# Patient Record
Sex: Female | Born: 1965 | Race: White | Hispanic: No | Marital: Married | Smoking: Never smoker
Health system: Southern US, Community
[De-identification: ages and names within clinical notes are randomized; demographics above are authoritative.]

## PROBLEM LIST (undated history)

## (undated) DIAGNOSIS — Z800 Family history of malignant neoplasm of digestive organs: Secondary | ICD-10-CM

## (undated) DIAGNOSIS — C801 Malignant (primary) neoplasm, unspecified: Secondary | ICD-10-CM

## (undated) DIAGNOSIS — I10 Essential (primary) hypertension: Secondary | ICD-10-CM

## (undated) DIAGNOSIS — E785 Hyperlipidemia, unspecified: Secondary | ICD-10-CM

## (undated) DIAGNOSIS — Z808 Family history of malignant neoplasm of other organs or systems: Secondary | ICD-10-CM

## (undated) DIAGNOSIS — C439 Malignant melanoma of skin, unspecified: Secondary | ICD-10-CM

## (undated) DIAGNOSIS — Z8041 Family history of malignant neoplasm of ovary: Secondary | ICD-10-CM

## (undated) HISTORY — DX: Family history of malignant neoplasm of other organs or systems: Z80.8

## (undated) HISTORY — DX: Hyperlipidemia, unspecified: E78.5

## (undated) HISTORY — DX: Family history of malignant neoplasm of ovary: Z80.41

## (undated) HISTORY — DX: Family history of malignant neoplasm of digestive organs: Z80.0

## (undated) HISTORY — DX: Malignant melanoma of skin, unspecified: C43.9

## (undated) HISTORY — PX: HYSTEROSCOPY: SHX211

## (undated) HISTORY — DX: Essential (primary) hypertension: I10

## (undated) HISTORY — PX: TOTAL ABDOMINAL HYSTERECTOMY: SHX209

## (undated) HISTORY — PX: ABDOMINAL HYSTERECTOMY: SHX81

---

## 2000-01-04 ENCOUNTER — Encounter: Payer: Self-pay | Admitting: Gynecology

## 2000-01-04 ENCOUNTER — Encounter: Admission: RE | Admit: 2000-01-04 | Discharge: 2000-01-04 | Payer: Self-pay | Admitting: Gynecology

## 2000-07-01 ENCOUNTER — Encounter (INDEPENDENT_AMBULATORY_CARE_PROVIDER_SITE_OTHER): Payer: Self-pay | Admitting: Specialist

## 2000-07-01 ENCOUNTER — Ambulatory Visit (HOSPITAL_COMMUNITY): Admission: RE | Admit: 2000-07-01 | Discharge: 2000-07-01 | Payer: Self-pay | Admitting: Gynecology

## 2000-12-26 ENCOUNTER — Encounter (INDEPENDENT_AMBULATORY_CARE_PROVIDER_SITE_OTHER): Payer: Self-pay | Admitting: Specialist

## 2000-12-26 ENCOUNTER — Ambulatory Visit (HOSPITAL_COMMUNITY): Admission: RE | Admit: 2000-12-26 | Discharge: 2000-12-26 | Payer: Self-pay | Admitting: Gynecology

## 2001-08-09 ENCOUNTER — Other Ambulatory Visit: Admission: RE | Admit: 2001-08-09 | Discharge: 2001-08-09 | Payer: Self-pay | Admitting: Gynecology

## 2002-09-11 ENCOUNTER — Other Ambulatory Visit: Admission: RE | Admit: 2002-09-11 | Discharge: 2002-09-11 | Payer: Self-pay | Admitting: Gynecology

## 2003-09-18 ENCOUNTER — Other Ambulatory Visit: Admission: RE | Admit: 2003-09-18 | Discharge: 2003-09-18 | Payer: Self-pay | Admitting: Gynecology

## 2003-09-24 ENCOUNTER — Observation Stay (HOSPITAL_COMMUNITY): Admission: RE | Admit: 2003-09-24 | Discharge: 2003-09-25 | Payer: Self-pay | Admitting: Gynecology

## 2003-09-24 ENCOUNTER — Encounter (INDEPENDENT_AMBULATORY_CARE_PROVIDER_SITE_OTHER): Payer: Self-pay

## 2004-10-30 ENCOUNTER — Other Ambulatory Visit: Admission: RE | Admit: 2004-10-30 | Discharge: 2004-10-30 | Payer: Self-pay | Admitting: Gynecology

## 2004-12-15 ENCOUNTER — Encounter: Admission: RE | Admit: 2004-12-15 | Discharge: 2004-12-15 | Payer: Self-pay | Admitting: Gynecology

## 2006-01-03 ENCOUNTER — Other Ambulatory Visit: Admission: RE | Admit: 2006-01-03 | Discharge: 2006-01-03 | Payer: Self-pay | Admitting: Gynecology

## 2006-01-03 ENCOUNTER — Encounter: Payer: Self-pay | Admitting: Internal Medicine

## 2006-02-07 ENCOUNTER — Encounter: Admission: RE | Admit: 2006-02-07 | Discharge: 2006-02-07 | Payer: Self-pay | Admitting: Gynecology

## 2007-04-13 ENCOUNTER — Encounter: Admission: RE | Admit: 2007-04-13 | Discharge: 2007-04-13 | Payer: Self-pay | Admitting: Gynecology

## 2007-04-17 ENCOUNTER — Ambulatory Visit: Payer: Self-pay | Admitting: Internal Medicine

## 2007-09-11 ENCOUNTER — Other Ambulatory Visit: Admission: RE | Admit: 2007-09-11 | Discharge: 2007-09-11 | Payer: Self-pay | Admitting: Gynecology

## 2007-10-09 ENCOUNTER — Ambulatory Visit: Payer: Self-pay | Admitting: Internal Medicine

## 2007-10-09 DIAGNOSIS — R5081 Fever presenting with conditions classified elsewhere: Secondary | ICD-10-CM | POA: Insufficient documentation

## 2007-10-09 DIAGNOSIS — E785 Hyperlipidemia, unspecified: Secondary | ICD-10-CM | POA: Insufficient documentation

## 2007-10-09 HISTORY — DX: Hyperlipidemia, unspecified: E78.5

## 2007-10-09 LAB — CONVERTED CEMR LAB
Bilirubin Urine: NEGATIVE
Glucose, Urine, Semiquant: NEGATIVE
Protein, U semiquant: 100
Urobilinogen, UA: NEGATIVE
pH: 6.5

## 2007-10-11 ENCOUNTER — Telehealth: Payer: Self-pay | Admitting: Internal Medicine

## 2007-10-19 ENCOUNTER — Telehealth: Payer: Self-pay | Admitting: Internal Medicine

## 2008-02-27 ENCOUNTER — Encounter: Payer: Self-pay | Admitting: Internal Medicine

## 2008-05-09 ENCOUNTER — Ambulatory Visit: Payer: Self-pay | Admitting: Internal Medicine

## 2008-05-09 DIAGNOSIS — N390 Urinary tract infection, site not specified: Secondary | ICD-10-CM | POA: Insufficient documentation

## 2008-08-29 ENCOUNTER — Encounter: Admission: RE | Admit: 2008-08-29 | Discharge: 2008-08-29 | Payer: Self-pay | Admitting: Gynecology

## 2009-01-09 ENCOUNTER — Ambulatory Visit: Payer: Self-pay | Admitting: Internal Medicine

## 2009-01-09 DIAGNOSIS — I10 Essential (primary) hypertension: Secondary | ICD-10-CM

## 2009-01-09 DIAGNOSIS — J069 Acute upper respiratory infection, unspecified: Secondary | ICD-10-CM | POA: Insufficient documentation

## 2009-01-09 HISTORY — DX: Essential (primary) hypertension: I10

## 2009-01-09 LAB — CONVERTED CEMR LAB
Cholesterol: 176 mg/dL (ref 0–200)
Total CHOL/HDL Ratio: 5.8
Triglycerides: 164 mg/dL — ABNORMAL HIGH (ref 0–149)
VLDL: 33 mg/dL (ref 0–40)

## 2009-01-23 ENCOUNTER — Ambulatory Visit: Payer: Self-pay | Admitting: Internal Medicine

## 2009-02-21 ENCOUNTER — Ambulatory Visit: Payer: Self-pay | Admitting: Internal Medicine

## 2010-10-26 ENCOUNTER — Ambulatory Visit: Payer: Self-pay | Admitting: Internal Medicine

## 2010-10-30 ENCOUNTER — Encounter: Admission: RE | Admit: 2010-10-30 | Discharge: 2010-10-30 | Payer: Self-pay | Admitting: Gynecology

## 2010-12-28 ENCOUNTER — Telehealth: Payer: Self-pay | Admitting: Internal Medicine

## 2011-01-19 NOTE — Assessment & Plan Note (Signed)
Summary: MED CK / REFILL // RS   Vital Signs:  Patient profile:   45 year old female Weight:      178 pounds Temp:     98.2 degrees F oral BP sitting:   122 / 74  (right arm) Cuff size:   regular  Vitals Entered By: Duard Brady LPN (October 26, 2010 7:59 AM) CC: medication review and refill      declines flu vaccine Is Patient Diabetic? No   CC:  medication review and refill      declines flu vaccine.  History of Present Illness: 45 year old patient who is seen today for follow-up of her hypertension.  She has maintained nice blood pressure control.  She has a history of mild dyslipidemia.  She is scheduled for laboratory studies at work next month and has had a recent gynecologic examination.  She has a history of mild exogenous obesity.  No concerns or complaints today.  No regular exercise, or recent weight loss remains on hormone replacement therapy  Preventive Screening-Counseling & Management  Alcohol-Tobacco     Smoking Status: never  Allergies (verified): No Known Drug Allergies  Past History:  Past Medical History: Reviewed history from 01/09/2009 and no changes required. Hyperlipidemia Hypertension  Past Surgical History: Reviewed history from 10/09/2007 and no changes required. Hysterectomy 2004  Social History: Smoking Status:  never  Review of Systems  The patient denies anorexia, fever, weight loss, weight gain, vision loss, decreased hearing, hoarseness, chest pain, syncope, dyspnea on exertion, peripheral edema, prolonged cough, headaches, hemoptysis, abdominal pain, melena, hematochezia, severe indigestion/heartburn, hematuria, incontinence, genital sores, muscle weakness, suspicious skin lesions, transient blindness, difficulty walking, depression, unusual weight change, abnormal bleeding, enlarged lymph nodes, angioedema, and breast masses.    Physical Exam  General:  overweight-appearing. 120/74 Head:  Normocephalic and atraumatic without  obvious abnormalities. No apparent alopecia or balding. Eyes:  No corneal or conjunctival inflammation noted. EOMI. Perrla. Funduscopic exam benign, without hemorrhages, exudates or papilledema. Vision grossly normal. Mouth:  Oral mucosa and oropharynx without lesions or exudates.  Teeth in good repair. Neck:  No deformities, masses, or tenderness noted. Lungs:  Normal respiratory effort, chest expands symmetrically. Lungs are clear to auscultation, no crackles or wheezes. Heart:  Normal rate and regular rhythm. S1 and S2 normal without gallop, murmur, click, rub or other extra sounds. Abdomen:  Bowel sounds positive,abdomen soft and non-tender without masses, organomegaly or hernias noted. Msk:  No deformity or scoliosis noted of thoracic or lumbar spine.   Pulses:  R and L carotid,radial,femoral,dorsalis pedis and posterior tibial pulses are full and equal bilaterally Extremities:  No clubbing, cyanosis, edema, or deformity noted with normal full range of motion of all joints.   Skin:  Intact without suspicious lesions or rashes Cervical Nodes:  No lymphadenopathy noted   Impression & Recommendations:  Problem # 1:  HYPERTENSION (ICD-401.9)  Her updated medication list for this problem includes:    Amlodipine Besy-benazepril Hcl 5-10 Mg Caps (Amlodipine besy-benazepril hcl) ..... One daily  Problem # 2:  HYPERLIPIDEMIA (ICD-272.4)  Complete Medication List: 1)  Amlodipine Besy-benazepril Hcl 5-10 Mg Caps (Amlodipine besy-benazepril hcl) .... One daily 2)  Vivelle-dot 0.075 Mg/24hr Pttw (Estradiol) .... Change twice wkly  Patient Instructions: 1)  Please schedule a follow-up appointment in 1 year. 2)  Limit your Sodium (Salt). 3)  It is important that you exercise regularly at least 20 minutes 5 times a week. If you develop chest pain, have severe difficulty breathing, or feel  very tired , stop exercising immediately and seek medical attention. 4)  You need to lose weight. Consider a  lower calorie diet and regular exercise.  5)  Take calcium +Vitamin D daily. 6)  Check your Blood Pressure regularly. If it is above: 150/90  you should make an appointment. Prescriptions: AMLODIPINE BESY-BENAZEPRIL HCL 5-10 MG CAPS (AMLODIPINE BESY-BENAZEPRIL HCL) one daily  #90 x 6   Entered and Authorized by:   Gordy Savers  MD   Signed by:   Gordy Savers  MD on 10/26/2010   Method used:   Electronically to        [facility] (retail)       [address]       [place], [place]  [address]       Ph: [phone]       Fax: [phone]   RxID:   [id number]    Orders Added: 1)  Est. Patient Level III [84132]  Appended Document: MED CK / REFILL // RS  Flu Vaccine Consent Questions     Do you have a history of severe allergic reactions to this vaccine? no    Any prior history of allergic reactions to egg and/or gelatin? no    Do you have a sensitivity to the preservative Thimersol? no    Do you have a past history of Guillan-Barre Syndrome? no    Do you currently have an acute febrile illness? no    Have you ever had a severe reaction to latex? no    Vaccine information given and explained to patient? yes    Are you currently pregnant? no    Lot Number:[id number]   Exp Date:06/19/2011   Site Given  Left Deltoid IM    Allergies: No Known Drug Allergies   Complete Medication List: 1)  Amlodipine Besy-benazepril Hcl 5-10 Mg Caps (Amlodipine besy-benazepril hcl) .... One daily 2)  Vivelle-dot 0.075 Mg/24hr Pttw (Estradiol) .... Change twice wkly  Other Orders: Admin 1st Vaccine (44010) Flu Vaccine 68yrs + [phone])   Orders Added: 1)  Admin 1st Vaccine [90471] 2)  Flu Vaccine 66yrs + [66440]

## 2011-01-21 NOTE — Progress Notes (Signed)
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 KIK

## 2011-02-25 ENCOUNTER — Encounter: Payer: Self-pay | Admitting: Internal Medicine

## 2011-03-01 ENCOUNTER — Encounter: Payer: Self-pay | Admitting: Internal Medicine

## 2011-03-01 ENCOUNTER — Ambulatory Visit (INDEPENDENT_AMBULATORY_CARE_PROVIDER_SITE_OTHER): Payer: BC Managed Care – PPO | Admitting: Internal Medicine

## 2011-03-01 DIAGNOSIS — E785 Hyperlipidemia, unspecified: Secondary | ICD-10-CM

## 2011-03-01 DIAGNOSIS — I10 Essential (primary) hypertension: Secondary | ICD-10-CM

## 2011-03-01 LAB — LIPID PANEL
Total CHOL/HDL Ratio: 5
Triglycerides: 151 mg/dL — ABNORMAL HIGH (ref 0.0–149.0)

## 2011-03-01 LAB — LDL CHOLESTEROL, DIRECT: Direct LDL: 144.2 mg/dL

## 2011-03-01 NOTE — Patient Instructions (Signed)
It is important that you exercise regularly, at least 20 minutes 3 to 4 times per week.  If you develop chest pain or shortness of breath seek  medical attention.  You need to lose weight.  Consider a lower calorie diet and regular exercise.  Return in 6 months for follow-up  

## 2011-03-01 NOTE — Progress Notes (Signed)
  Subjective:    Patient ID: Dawn Cole, female    DOB: [birth year], 45 y.o.   MRN: [id number]  HPI   45 year old patient who has a history of treated hypertension on dual therapy. This has been well-controlled. She did have a lipid profile and other lab done at work late last year. This revealed mild dyslipidemia which he has had in the past. Since that time she has been here to better lifestyle changes with exercise better eating habits and has lost at least 14 pounds in weight.. She feels quite well    Review of Systems  Constitutional: Negative.   HENT: Negative for hearing loss, congestion, sore throat, rhinorrhea, dental problem, sinus pressure and tinnitus.   Eyes: Negative for pain, discharge and visual disturbance.  Respiratory: Negative for cough and shortness of breath.   Cardiovascular: Negative for chest pain, palpitations and leg swelling.  Gastrointestinal: Negative for nausea, vomiting, abdominal pain, diarrhea, constipation, blood in stool and abdominal distention.  Genitourinary: Negative for dysuria, urgency, frequency, hematuria, flank pain, vaginal bleeding, vaginal discharge, difficulty urinating, vaginal pain and pelvic pain.  Musculoskeletal: Negative for joint swelling, arthralgias and gait problem.  Skin: Negative for rash.  Neurological: Negative for dizziness, syncope, speech difficulty, weakness, numbness and headaches.  Hematological: Negative for adenopathy.  Psychiatric/Behavioral: Negative for behavioral problems, dysphoric mood and agitation. The patient is not nervous/anxious.        Objective:   Physical Exam  Constitutional: She is oriented to person, place, and time. She appears well-developed and well-nourished.  HENT:  Head: Normocephalic.  Right Ear: External ear normal.  Left Ear: External ear normal.  Mouth/Throat: Oropharynx is clear and moist.  Eyes: Conjunctivae and EOM are normal. Pupils are equal, round, and reactive to light.  Neck:  Normal range of motion. Neck supple. No thyromegaly present.  Cardiovascular: Normal rate, regular rhythm, normal heart sounds and intact distal pulses.   Pulmonary/Chest: Effort normal and breath sounds normal.  Abdominal: Soft. Bowel sounds are normal. She exhibits no mass. There is no tenderness.  Musculoskeletal: Normal range of motion.  Lymphadenopathy:    She has no cervical adenopathy.  Neurological: She is alert and oriented to person, place, and time.  Skin: Skin is warm and dry. No rash noted.  Psychiatric: She has a normal mood and affect. Her behavior is normal.          Assessment & Plan:   mild dyslipidemia  Voluntary weight loss  Hypertension stable   We'll continue aggressive lifestyle changes and hopefully she will benefit from additional weight loss diet and exercise. A lipid profile will be reviewed today. Her blood pressure and general status will be rechecked in 6 months

## 2011-03-15 ENCOUNTER — Encounter: Payer: Self-pay | Admitting: Internal Medicine

## 2011-05-07 NOTE — Op Note (Signed)
NAME:  ARDRA, KUZNICKI                          ACCOUNT NO.:  [id number]   MEDICAL RECORD NO.:  [id number]                   PATIENT TYPE:  AMB   LOCATION:  DAY                                  FACILITY:  [facility]   PHYSICIAN:  Gretta Cool, M.D.              DATE OF BIRTH:  1966-02-02   DATE OF PROCEDURE:  09/24/2003  DATE OF DISCHARGE:                                 OPERATIVE REPORT   PREOPERATIVE DIAGNOSES:  1. Abnormal uterine bleeding up to 14 days each cycle.  2. Cyclic pelvic pain.   POSTOPERATIVE DIAGNOSES:  1. Abnormal uterine bleeding up to 14 days each cycle.  2. Cyclic pelvic pain.   PROCEDURE:  Vaginal hysterectomy.   SURGEON:  Gretta Cool, M.D.   ASSISTANT:  Raynald Kemp, M.D.   ANESTHESIA:  General orotracheal.   DESCRIPTION OF PROCEDURE:  Under excellent anesthesia as above with the  patient prepped and draped in Allen stirrups in lithotomy position with her  bladder drained by Foley catheter, a weighted speculum was placed in the  vagina.  The cervix was then progressively infiltrated with Xylocaine with  epinephrine 1%, 1:200,000.  The mucosa was then incised and the mucosa  pushed off the lower uterine segment.  The cul-de-sac was then entered with  Mayo scissors.  The uterosacral, then round, then cardinal ligaments were  progressively clamped, cut, sutured, and tied with 0 Vicryl.  The  vesicovaginal plica was opened and the Deaver placed beneath the bladder.  The adnexal structures were then clamped across once the uterus was inverted  and the uterus removed.  Pedicles were then doubly ligated with 0 Vicryl.  At this point all of the pedicles were dry.  Examination of the ovaries  revealed no evidence of endometriosis or other pathology as far as one could  see.  There were minimal adhesions to previous sterilization by Filshie  clip.  The Filshie clip on the right was removed.  On the left it could not  be identified by palpation or by visual  inspection.  At this point the cuff  was closed by a pursestring suture of peritoneum anterior to lateral  pedicles to posterior.  The uterosacral cardinal ligament complex was then  secured to the vaginal cuff as a uterosacral cardinal colposuspension so as  to protect the vaginal cuff from future descent.  At this point the fascia  was approximated edge to edge with a subcuticular closure of 0 Vicryl.  At  this point the patient returned to the recovery room in excellent condition.                                               Gretta Cool, M.D.    CWL/MEDQ  D:  09/24/2003  T:  09/24/2003  Job:  [id number]   cc:   Gordy Savers, M.D. [facility]

## 2011-05-07 NOTE — Assessment & Plan Note (Signed)
[facility] OFFICE NOTE   Cole, Dawn                       MRN:          [id number]  DATE:04/17/2007                            DOB:          September 12, 1966    A 45 year old female seen today to re-establish with our practice. A  laboratory screen at work revealed a mild hypercholesterolemia with a  total cholesterol of 241, LDL cholesterol of 131; the year before these  numbers were 206 and 106 respectively. She has enjoyed excellent health.  She is followed by Dr. Nicholas Lose and has had a hysterectomy in 2004 or 2005.   Weight is 169.   SOCIAL HISTORY:  She is married, two daughters ages 27 and 59. She works  as a Barrister's clerk.   FAMILY HISTORY:  Father died at age 8 of a brain tumor. Mother age 43.  Is a patient here, Dawn Cole, in good health. Two brothers, one  deceased of pancreatic cancer at 75 and one is recently diagnosed with  prostate cancer. Two sisters positive for hypertension.   PHYSICAL EXAMINATION:  Revealed a mildly overweight white female in no  acute distress. Blood pressure 130/90.  SKIN: Negative.  FUNDI, EAR, NOSE AND THROAT: Clear.  CHEST: Was clear.  CARDIOVASCULAR: Normal heart sounds. No murmurs.  ABDOMEN: Slightly overweight, soft and nontender. No organomegaly. No  bruits.  EXTREMITIES: Negative with full peripheral pulses.   IMPRESSION:  Mild hypercholesterolemia.   DISPOSITION:  Diet, weight loss, exercise all encouraged. Will attempt a  non-pharmacologic regimen. Recheck a lipid profile in 6 months.     Gordy Savers, MD  Electronically Signed    PFK/MedQ  DD: 04/17/2007  DT: 04/17/2007  Job #: [id number]

## 2011-05-07 NOTE — H&P (Signed)
NAME:  Dawn Cole, Dawn Cole                          ACCOUNT NO.:  [id number]   MEDICAL RECORD NO.:  [id number]                   PATIENT TYPE:  AMB   LOCATION:  DAY                                  FACILITY:  [facility]   PHYSICIAN:  Gretta Cool, M.D.              DATE OF BIRTH:  1966-08-21   DATE OF ADMISSION:  DATE OF DISCHARGE:                                HISTORY & PHYSICAL   CHIEF COMPLAINT:  1. Abnormal uterine bleeding.  2. Cyclic pelvic pain.   HISTORY OF PRESENT ILLNESS:  A 45 year old gravida 2, para 2 with history of  endometrial polyps.  She has a history of resection of the polyp, but then  recurrence subsequently a couple of years later.  Her endometrial cavity is  now small enough that she cannot have adequate resection of the cavity.  She  has menses lasting seven to 14 days and has pelvic discomfort and cramping  during her entire menses.  She now has sufficient discomfort that it is  incapacitating.  She is admitted for definitive therapy vaginal  hysterectomy, possible abdominal, possible salpingo-oophorectomy.   PAST MEDICAL HISTORY:  Usual childhood disease without sequelae.  Medical  illnesses:  None.  Accident/injuries:  None.   ALLERGIES:  None known.   CURRENT MEDICATIONS:  None.   FAMILY HISTORY:  Father died age 69 of a brain tumor.  Mom is living and  well at 75.  One brother died of cancer of unknown primary.  Four other  siblings living and well.   HABITS:  Denies ethanol, tobacco, recreational drugs.   REVIEW OF SYSTEMS:  HEENT:  Denies symptoms.  CARDIORESPIRATORY:  Denies  asthma, cough, bronchitis, shortness of breath.  GASTROINTESTINAL/GENITOURINARY:  Denies frequency, urgency, dysuria, change  in bowel habits, food intolerance.   PHYSICAL EXAMINATION:  GENERAL:  Well-developed, well-nourished white  female.  HEENT:  Pupils equal, react to light and accommodate.  Fundi not examined.  Oropharynx clear.  NECK:  Supple without mass or  thyroid enlargement.  Node-bearing areas are  negative.  BREASTS:  Without mass, nodes, nipple discharge.  HEART:  Regular rate and rhythm without murmur or cardiac enlargement.  CHEST:  Clear P to A.  ABDOMEN:  Soft, scaphoid without mass, organomegaly.  PELVIC:  External genitalia:  Normal female.  Vagina clean, rugose.  Cervix  is parous, clean.  Uterus is mid position, normal size, shape, contour.  Adnexa clear.  Rectovaginal confirms.  There is adequate descent, but no  pelvic support issues except thinning of the perineal body.  Rectovaginal  examination confirms.  EXTREMITIES:  Negative.  NEUROLOGIC:  Physiologic.    IMPRESSION:  1. Abnormal uterine bleeding 7-14 days.  2. Cyclic pelvic pain throughout menses.  3. History of hysteroscopy, resection of endometrial polyp.  4. History of tubal sterilization.   PLAN:  Vaginal hysterectomy, possible abdominal, possible salpingo-  oophorectomy.  Gretta Cool, M.D.    CWL/MEDQ  D:  09/23/2003  T:  09/23/2003  Job:  [id number]   cc:   Gordy Savers, M.D. [facility]

## 2011-05-07 NOTE — Op Note (Signed)
[facility]  Patient:    Dawn Cole, Dawn Cole                       MRN: [id number] Proc. Date: 12/26/00 Adm. Date:  [id number] Attending:  Katrina Stack                           Operative Report  PREOPERATIVE DIAGNOSES:  Abnormal uterine bleeding, unresponsive to conservative therapy; endometrial polyps demonstrated by saline infusion sonography with previous incomplete hysteroscopic resection.  POSTOPERATIVE DIAGNOSES:  Abnormal uterine bleeding, unresponsive to conservative therapy, endometrial polyps demonstrated by saline infusion sonography with previous incomplete hysteroscopic resection.  PROCEDURE:  SURGEON:  Gretta Cool, M.D.  ANESTHESIA:  IV sedation and paracervical block.  DESCRIPTION OF PROCEDURE:  Under satisfactory paracervical block anesthesia, with the patient prepped and draped in lithotomy position, her cervix was grasped with a single-tooth tenaculum and uterus sounded.  The cavity sounded quite small.  The cervix was progressively dilated with a series of Pratt dilators to accommodate a 7-mm resectoscope.  Upon introducing the hysteroscope into the cavity, a polyp was noted on the posterior wall.  The polyp was resected.  The cavity was quite small and difficulty was had throughout maintaining adequate flush of fluid in and out of the hysteroscope. Significant fluid deficit was noted and there was concern about possible penetration of the posterior wall.  At this point, the procedure was terminated without further complication.  Culdocentesis was performed and returned the fluid deficit to less than 100 cc.  Patient tolerated the procedure without significant discomfort and was returned to the recovery room without complication. DD:  12/26/00 TD:  12/27/00 Job: [id number]

## 2011-05-07 NOTE — Op Note (Signed)
[facility]  Patient:    Dawn Cole, Dawn Cole                       MRN: [id number] Adm. Date:  [id number] Attending:  Katrina Stack                           Operative Report  PREOPERATIVE DIAGNOSES: 1. Desires sterilization. 2. Endometrial polyp with abnormal uterine bleeding.  POSTOPERATIVE DIAGNOSES: 1. Desires sterilization. 2. Endometrial polyp with abnormal uterine bleeding.  PROCEDURE: 1. Filshie clip sterilization. 2. Hysteroscopy. 3. Resection of endometrial polyp. 4. Partial endometrial ablation.  DESCRIPTION OF PROCEDURE:  Under IV sedation and paracervical block and local infiltration, a subumbilical incision was made by a vertical incision in the umbilicus.  A Veress cannula was introduced and an adequate pneumoperitoneum obtained by nitrous oxide.  The laparoscope trocar was then introduced, and the  pelvic organs visualized.  The fallopian tubes were traced to their fimbriated nd. Both ovaries appeared normal.  There was no evidence of significant endometriosis. There was one fibroid bulging subserosally from the uterine surface anteriorly. No other significant pelvic pathology.  The fallopian tubes were grasped approximately 1.0 cm lateral to the uterine insertion, and clipped by Felshie clips on both sides.  Great care was taken to make certain that the clip was closed transluminally across the entire fallopian tube.  Both were visualized closely after the removal of the flier, for better visualization.  Both were well applied. At this point, the attention was turned to the vaginal portion of the procedure. Gas was allowed to escape, and the incision was closed with deep sutures of #5-0 Dexon.  The skin closure with Steri-Strips.  At this point, the patient was reprepped and draped for the vaginal portion of he procedure.  The cervix was grasped with a single tooth tenaculum, and then progressively dilated with a series  of Pratt dilators to accommodate a 7.0 mm resectoscope.  The cervical canal and the endometrial cavity was then examined nd a polyp, that appeared much like a ________ protruded from the anterior wall of  the uterus into the lower uterine segment.  That polyp was excised totally.  A smaller polyp on the contralateral wall was also excised.  At this point the resection was undertaken.  The endometrial cavity was systematically resected approximately 3.0 mm out into myometrial tissue.  A depression was noted in the top of the fundus at the area that the Hulka tenaculum had apparently been previously applied.  There was fluid deficit 300 cc at this point, and because of concern ver possible penetration and excess fluid, a risk of overhydration, the procedure was terminated.  The resection was definitely not completely a total endometrial resection, and a VaporTrode was not used to completely eliminate any endometrial tissue.  There was no residual evidence of polyp formation or other abnormality, however.  At this point the procedure was terminated without complications, with a 300 cc fluid deficit.  The patient tolerated the procedure without discomfort, and upon awakening remained very comfortable.  At this point the patient was returned to the recovery room n excellent condition. DD:  07/01/00 TD:  07/01/00 Job: [id number]

## 2011-08-13 ENCOUNTER — Ambulatory Visit (INDEPENDENT_AMBULATORY_CARE_PROVIDER_SITE_OTHER): Payer: BC Managed Care – PPO | Admitting: Internal Medicine

## 2011-08-13 ENCOUNTER — Encounter: Payer: Self-pay | Admitting: Internal Medicine

## 2011-08-13 VITALS — BP 130/88 | Temp 97.6°F | Wt 160.0 lb

## 2011-08-13 DIAGNOSIS — M545 Low back pain, unspecified: Secondary | ICD-10-CM

## 2011-08-13 DIAGNOSIS — N390 Urinary tract infection, site not specified: Secondary | ICD-10-CM

## 2011-08-13 DIAGNOSIS — I10 Essential (primary) hypertension: Secondary | ICD-10-CM

## 2011-08-13 LAB — POCT URINALYSIS DIPSTICK
Nitrite, UA: POSITIVE
Protein, UA: NEGATIVE
pH, UA: 7.5

## 2011-08-13 MED ORDER — CIPROFLOXACIN HCL 500 MG PO TABS: 500.0000 mg | ORAL_TABLET | Freq: Two times a day (BID) | ORAL | Status: AC

## 2011-08-13 NOTE — Patient Instructions (Signed)
Limit your sodium (Salt) intake    It is important that you exercise regularly, at least 20 minutes 3 to 4 times per week.  If you develop chest pain or shortness of breath seek  medical attention.  Please check your blood pressure on a regular basis.  If it is consistently greater than 150/90, please make an office appointment.  Return in 6 months for follow-up   

## 2011-08-13 NOTE — Progress Notes (Signed)
  Subjective:    Patient ID: Dawn Cole, female    DOB: 17-Dec-1966, 45 y.o.   MRN: [id number]  HPI 45 year old patient who has a history of treated hypertension. The past week she has had low back pain as well as suprapubic pain. She has had a urinary tract infection in the past with very similar symptoms. Not much in way of dysuria or urinary frequency. She does have hypertension and has not been too compliant with taking her medication otherwise she feels well no fever chills or flank pain.   Review of Systems  Constitutional: Negative.   HENT: Negative for hearing loss, congestion, sore throat, rhinorrhea, dental problem, sinus pressure and tinnitus.   Eyes: Negative for pain, discharge and visual disturbance.  Respiratory: Negative for cough and shortness of breath.   Cardiovascular: Negative for chest pain, palpitations and leg swelling.  Gastrointestinal: Negative for nausea, vomiting, abdominal pain, diarrhea, constipation, blood in stool and abdominal distention.  Genitourinary: Negative for dysuria, urgency, frequency, hematuria, flank pain, vaginal bleeding, vaginal discharge, difficulty urinating, vaginal pain and pelvic pain.  Musculoskeletal: Positive for back pain. Negative for joint swelling, arthralgias and gait problem.  Skin: Negative for rash.  Neurological: Negative for dizziness, syncope, speech difficulty, weakness, numbness and headaches.  Hematological: Negative for adenopathy.  Psychiatric/Behavioral: Negative for behavioral problems, dysphoric mood and agitation. The patient is not nervous/anxious.        Objective:   Physical Exam  Constitutional: She is oriented to person, place, and time. She appears well-developed and well-nourished.  HENT:  Head: Normocephalic.  Right Ear: External ear normal.  Left Ear: External ear normal.  Mouth/Throat: Oropharynx is clear and moist.  Eyes: Conjunctivae and EOM are normal. Pupils are equal, round, and reactive to  light.  Neck: Normal range of motion. Neck supple. No thyromegaly present.  Cardiovascular: Normal rate, regular rhythm, normal heart sounds and intact distal pulses.   Pulmonary/Chest: Effort normal and breath sounds normal.  Abdominal: Soft. Bowel sounds are normal. She exhibits no mass. There is no tenderness.       Mild suprapubic tenderness  Musculoskeletal: Normal range of motion.  Lymphadenopathy:    She has no cervical adenopathy.  Neurological: She is alert and oriented to person, place, and time.  Skin: Skin is warm and dry. No rash noted.  Psychiatric: She has a normal mood and affect. Her behavior is normal.          Assessment & Plan:   Recurrent UTI Hypertension suboptimal control. Compliance issues addressed  We'll treat with Cipro 500 mg twice a day for 7 days

## 2011-09-09 ENCOUNTER — Other Ambulatory Visit: Payer: Self-pay | Admitting: Gynecology

## 2011-12-22 ENCOUNTER — Other Ambulatory Visit: Payer: Self-pay | Admitting: Gynecology

## 2011-12-22 DIAGNOSIS — Z1231 Encounter for screening mammogram for malignant neoplasm of breast: Secondary | ICD-10-CM

## 2012-01-07 ENCOUNTER — Ambulatory Visit: Payer: BC Managed Care – PPO

## 2012-02-02 ENCOUNTER — Encounter: Payer: Self-pay | Admitting: Internal Medicine

## 2012-02-03 ENCOUNTER — Ambulatory Visit
Admission: RE | Admit: 2012-02-03 | Discharge: 2012-02-03 | Disposition: A | Discharge status: Home / Self Care | Payer: BC Managed Care – PPO | Source: Ambulatory Visit | Attending: Gynecology | Admitting: Gynecology

## 2012-02-03 DIAGNOSIS — Z1231 Encounter for screening mammogram for malignant neoplasm of breast: Secondary | ICD-10-CM

## 2012-09-14 ENCOUNTER — Other Ambulatory Visit: Payer: Self-pay | Admitting: Gynecology

## 2013-04-17 ENCOUNTER — Other Ambulatory Visit: Payer: Self-pay

## 2013-04-17 DIAGNOSIS — Z1231 Encounter for screening mammogram for malignant neoplasm of breast: Secondary | ICD-10-CM

## 2013-05-03 ENCOUNTER — Ambulatory Visit
Admission: RE | Admit: 2013-05-03 | Discharge: 2013-05-03 | Disposition: A | Discharge status: Home / Self Care | Payer: BC Managed Care – PPO | Source: Ambulatory Visit

## 2013-05-03 DIAGNOSIS — Z1231 Encounter for screening mammogram for malignant neoplasm of breast: Secondary | ICD-10-CM

## 2013-05-24 ENCOUNTER — Other Ambulatory Visit (INDEPENDENT_AMBULATORY_CARE_PROVIDER_SITE_OTHER): Payer: BC Managed Care – PPO

## 2013-05-24 DIAGNOSIS — Z0000 Encounter for general adult medical examination without abnormal findings: Secondary | ICD-10-CM

## 2013-05-24 LAB — BASIC METABOLIC PANEL
GFR: 93.09 mL/min (ref >60.00)
Glucose, Bld: 90 mg/dL (ref 70–99)
Potassium: 4.5 mEq/L (ref 3.5–5.1)
Sodium: 139 mEq/L (ref 135–145)

## 2013-05-24 LAB — LIPID PANEL
Cholesterol: 186 mg/dL (ref 0–200)
HDL: 34.6 mg/dL — ABNORMAL LOW (ref >39.00)
Triglycerides: 140 mg/dL (ref 0.0–149.0)
VLDL: 28 mg/dL (ref 0.0–40.0)

## 2013-05-24 LAB — CBC WITH DIFFERENTIAL/PLATELET
Basophils Absolute: 0.1 10*3/uL (ref 0.0–0.1)
Eosinophils Relative: 4.6 % (ref 0.0–5.0)
MCV: 88.4 fl (ref 78.0–100.0)
Monocytes Absolute: 0.4 10*3/uL (ref 0.1–1.0)
Neutrophils Relative %: 62.7 % (ref 43.0–77.0)
Platelets: 272 10*3/uL (ref 150.0–400.0)
RDW: 12.9 % (ref 11.5–14.6)
WBC: 8.6 10*3/uL (ref 4.5–10.5)

## 2013-05-24 LAB — HEPATIC FUNCTION PANEL
AST: 17 U/L (ref 0–37)
Albumin: 3.6 g/dL (ref 3.5–5.2)
Alkaline Phosphatase: 70 U/L (ref 39–117)
Bilirubin, Direct: 0 mg/dL (ref 0.0–0.3)
Total Protein: 7.2 g/dL (ref 6.0–8.3)

## 2013-05-24 LAB — POCT URINALYSIS DIPSTICK
Bilirubin, UA: NEGATIVE
Blood, UA: NEGATIVE
Ketones, UA: NEGATIVE
Protein, UA: NEGATIVE
Spec Grav, UA: 1.02
pH, UA: 6.5

## 2013-05-29 ENCOUNTER — Ambulatory Visit (INDEPENDENT_AMBULATORY_CARE_PROVIDER_SITE_OTHER): Payer: BC Managed Care – PPO | Admitting: Internal Medicine

## 2013-05-29 ENCOUNTER — Encounter: Payer: Self-pay | Admitting: Internal Medicine

## 2013-05-29 VITALS — BP 120/80 | HR 78 | Temp 98.0°F | Resp 18 | Ht 64.0 in | Wt 173.0 lb

## 2013-05-29 DIAGNOSIS — Z0000 Encounter for general adult medical examination without abnormal findings: Secondary | ICD-10-CM

## 2013-05-29 DIAGNOSIS — I10 Essential (primary) hypertension: Secondary | ICD-10-CM

## 2013-05-29 DIAGNOSIS — Z809 Family history of malignant neoplasm, unspecified: Secondary | ICD-10-CM

## 2013-05-29 MED ORDER — AMLODIPINE BESY-BENAZEPRIL HCL 5-10 MG PO CAPS: 1.0000 | ORAL_CAPSULE | Freq: Every day | ORAL | Status: DC

## 2013-05-29 NOTE — Progress Notes (Signed)
  Subjective:    Patient ID: Dawn Cole, female    DOB: 07-24-1966, 48 y.o.   MRN: [id number]  HPI  47 year old patient who is seen today for a health maintenance examination. She enjoys excellent health. She does have a history of mild dyslipidemia and treated hypertension. No concerns or complaints today.  Family history remarkable for colon cancer. Her mother had colon cancer diagnosed at age 34 and diagnosed with pancreatic cancer at 85. One brother also died of complicates the pancreatic cancer 60 Father died in his 78s of an apparent primary brain neoplasm  Social history married nonsmoker; works at [employer];  17-year-old granddaughter   Review of Systems  Constitutional: Negative.   HENT: Negative for hearing loss, congestion, sore throat, rhinorrhea, dental problem, sinus pressure and tinnitus.   Eyes: Negative for pain, discharge and visual disturbance.  Respiratory: Negative for cough and shortness of breath.   Cardiovascular: Negative for chest pain, palpitations and leg swelling.  Gastrointestinal: Negative for nausea, vomiting, abdominal pain, diarrhea, constipation, blood in stool and abdominal distention.  Genitourinary: Negative for dysuria, urgency, frequency, hematuria, flank pain, vaginal bleeding, vaginal discharge, difficulty urinating, vaginal pain and pelvic pain.  Musculoskeletal: Negative for joint swelling, arthralgias and gait problem.  Skin: Negative for rash.  Neurological: Negative for dizziness, syncope, speech difficulty, weakness, numbness and headaches.  Hematological: Negative for adenopathy.  Psychiatric/Behavioral: Negative for behavioral problems, dysphoric mood and agitation. The patient is not nervous/anxious.        Objective:   Physical Exam  Constitutional: She is oriented to person, place, and time. She appears well-developed and well-nourished.  HENT:  Head: Normocephalic.  Right Ear: External ear normal.  Left Ear:  External ear normal.  Mouth/Throat: Oropharynx is clear and moist.  Eyes: Conjunctivae and EOM are normal. Pupils are equal, round, and reactive to light.  Neck: Normal range of motion. Neck supple. No thyromegaly present.  Cardiovascular: Normal rate, regular rhythm, normal heart sounds and intact distal pulses.   Pulmonary/Chest: Effort normal and breath sounds normal.  Abdominal: Soft. Bowel sounds are normal. She exhibits no mass. There is no tenderness.  Musculoskeletal: Normal range of motion.  Lymphadenopathy:    She has no cervical adenopathy.  Neurological: She is alert and oriented to person, place, and time.  Skin: Skin is warm and dry. No rash noted.  Psychiatric: She has a normal mood and affect. Her behavior is normal.          Assessment & Plan:   Preventive health examination Hypertension well controlled Mild exogenous obesity. Weight loss encouraged  No change in medications Exercise encouraged Home blood pressure monitoring recommended Recheck 1 year

## 2013-05-29 NOTE — Progress Notes (Signed)
  Subjective:    Patient ID: Dawn Cole, female    DOB: 10-Jun-1966, 47 y.o.   MRN: [id number]  HPI  Wt Readings from Last 3 Encounters:  05/29/13 173 lb (78.472 kg)  08/13/11 160 lb (72.576 kg)  03/01/11 164 lb (74.39 kg)    Review of Systems     Objective:   Physical Exam        Assessment & Plan:

## 2013-05-29 NOTE — Patient Instructions (Signed)
Limit your sodium (Salt) intake  You need to lose weight.  Consider a lower calorie diet and regular exercise.  Please check your blood pressure on a regular basis.  If it is consistently greater than 150/90, please make an office appointment.  

## 2013-05-30 ENCOUNTER — Other Ambulatory Visit: Payer: Self-pay | Admitting: *Deleted

## 2013-05-30 MED ORDER — AMLODIPINE BESY-BENAZEPRIL HCL 5-10 MG PO CAPS: 1.0000 | ORAL_CAPSULE | Freq: Every day | ORAL | Status: DC

## 2013-10-25 ENCOUNTER — Other Ambulatory Visit: Payer: Self-pay

## 2014-01-28 ENCOUNTER — Telehealth: Payer: Self-pay | Admitting: Internal Medicine

## 2014-01-28 MED ORDER — AMLODIPINE BESY-BENAZEPRIL HCL 5-10 MG PO CAPS: 1.0000 | ORAL_CAPSULE | Freq: Every day | ORAL | Status: DC

## 2014-01-28 NOTE — Telephone Encounter (Signed)
Pt needs new rx amlodipine-benazepril 5-10 mg #90 w/[facility]ills [facility] [phone]

## 2014-01-28 NOTE — Telephone Encounter (Signed)
Pt notified Rx sent to [facility].

## 2014-05-27 ENCOUNTER — Other Ambulatory Visit (INDEPENDENT_AMBULATORY_CARE_PROVIDER_SITE_OTHER): Payer: BC Managed Care – PPO

## 2014-05-27 DIAGNOSIS — Z0000 Encounter for general adult medical examination without abnormal findings: Secondary | ICD-10-CM

## 2014-05-27 LAB — POCT URINALYSIS DIPSTICK
Bilirubin, UA: NEGATIVE
Blood, UA: NEGATIVE
Glucose, UA: NEGATIVE
KETONES UA: NEGATIVE
LEUKOCYTES UA: NEGATIVE
Nitrite, UA: NEGATIVE
PH UA: 6.5
PROTEIN UA: NEGATIVE
SPEC GRAV UA: 1.015
UROBILINOGEN UA: 0.2

## 2014-05-27 LAB — LIPID PANEL
CHOLESTEROL: 189 mg/dL (ref 0–200)
HDL: 39.5 mg/dL (ref >39.00)
LDL Cholesterol: 117 mg/dL — ABNORMAL HIGH (ref 0–99)
NonHDL: 149.5
Total CHOL/HDL Ratio: 5
Triglycerides: 164 mg/dL — ABNORMAL HIGH (ref 0.0–149.0)
VLDL: 32.8 mg/dL (ref 0.0–40.0)

## 2014-05-27 LAB — BASIC METABOLIC PANEL
BUN: 16 mg/dL (ref 6–23)
CHLORIDE: 106 meq/L (ref 96–112)
CO2: 28 meq/L (ref 19–32)
Calcium: 9.4 mg/dL (ref 8.4–10.5)
Creatinine, Ser: 0.7 mg/dL (ref 0.4–1.2)
GFR: 94.24 mL/min (ref >60.00)
Glucose, Bld: 79 mg/dL (ref 70–99)
POTASSIUM: 4.9 meq/L (ref 3.5–5.1)
SODIUM: 140 meq/L (ref 135–145)

## 2014-05-27 LAB — CBC WITH DIFFERENTIAL/PLATELET
BASOS PCT: 0.3 % (ref 0.0–3.0)
Basophils Absolute: 0 10*3/uL (ref 0.0–0.1)
EOS ABS: 0.4 10*3/uL (ref 0.0–0.7)
EOS PCT: 4.8 % (ref 0.0–5.0)
HEMATOCRIT: 41 % (ref 36.0–46.0)
HEMOGLOBIN: 13.7 g/dL (ref 12.0–15.0)
LYMPHS ABS: 2.2 10*3/uL (ref 0.7–4.0)
Lymphocytes Relative: 28.4 % (ref 12.0–46.0)
MCHC: 33.3 g/dL (ref 30.0–36.0)
MCV: 87.8 fl (ref 78.0–100.0)
MONO ABS: 0.6 10*3/uL (ref 0.1–1.0)
Monocytes Relative: 7.2 % (ref 3.0–12.0)
NEUTROS ABS: 4.6 10*3/uL (ref 1.4–7.7)
NEUTROS PCT: 59.3 % (ref 43.0–77.0)
Platelets: 242 10*3/uL (ref 150.0–400.0)
RBC: 4.67 Mil/uL (ref 3.87–5.11)
RDW: 12.7 % (ref 11.5–15.5)
WBC: 7.8 10*3/uL (ref 4.0–10.5)

## 2014-05-27 LAB — HEPATIC FUNCTION PANEL
ALBUMIN: 3.8 g/dL (ref 3.5–5.2)
ALT: 25 U/L (ref 0–35)
AST: 22 U/L (ref 0–37)
Alkaline Phosphatase: 58 U/L (ref 39–117)
BILIRUBIN DIRECT: 0.1 mg/dL (ref 0.0–0.3)
TOTAL PROTEIN: 6.8 g/dL (ref 6.0–8.3)
Total Bilirubin: 0.8 mg/dL (ref 0.2–1.2)

## 2014-05-27 LAB — TSH: TSH: 1.75 u[IU]/mL (ref 0.35–4.50)

## 2014-05-30 ENCOUNTER — Ambulatory Visit (INDEPENDENT_AMBULATORY_CARE_PROVIDER_SITE_OTHER): Payer: BC Managed Care – PPO | Admitting: Internal Medicine

## 2014-05-30 ENCOUNTER — Encounter: Payer: Self-pay | Admitting: Internal Medicine

## 2014-05-30 VITALS — BP 110/70 | HR 73 | Temp 98.3°F | Resp 20 | Ht 62.75 in | Wt 169.0 lb

## 2014-05-30 DIAGNOSIS — E785 Hyperlipidemia, unspecified: Secondary | ICD-10-CM

## 2014-05-30 DIAGNOSIS — Z0000 Encounter for general adult medical examination without abnormal findings: Secondary | ICD-10-CM

## 2014-05-30 DIAGNOSIS — I10 Essential (primary) hypertension: Secondary | ICD-10-CM

## 2014-05-30 DIAGNOSIS — Z23 Encounter for immunization: Secondary | ICD-10-CM

## 2014-05-30 NOTE — Progress Notes (Signed)
Pre-visit discussion using our clinic review tool. No additional management support is needed unless otherwise documented below in the visit note.  

## 2014-05-30 NOTE — Patient Instructions (Signed)
Limit your sodium (Salt) intake  Please check your blood pressure on a regular basis.  If it is consistently greater than 150/90, please make an office appointment.    It is important that you exercise regularly, at least 20 minutes 3 to 4 times per week.  If you develop chest pain or shortness of breath seek  medical attention.  You need to lose weight.  Consider a lower calorie diet and regular exercise.Health Maintenance, Female A healthy lifestyle and preventative care can promote health and wellness.  Maintain regular health, dental, and eye exams.  Eat a healthy diet. Foods like vegetables, fruits, whole grains, low-fat dairy products, and lean protein foods contain the nutrients you need without too many calories. Decrease your intake of foods high in solid fats, added sugars, and salt. Get information about a proper diet from your caregiver, if necessary.  Regular physical exercise is one of the most important things you can do for your health. Most adults should get at least 150 minutes of moderate-intensity exercise (any activity that increases your heart rate and causes you to sweat) each week. In addition, most adults need muscle-strengthening exercises on 2 or more days a week.   Maintain a healthy weight. The body mass index (BMI) is a screening tool to identify possible weight problems. It provides an estimate of body fat based on height and weight. Your caregiver can help determine your BMI, and can help you achieve or maintain a healthy weight. For adults 20 years and older:  A BMI below 18.5 is considered underweight.  A BMI of 18.5 to 24.9 is normal.  A BMI of 25 to 29.9 is considered overweight.  A BMI of 30 and above is considered obese.  Maintain normal blood lipids and cholesterol by exercising and minimizing your intake of saturated fat. Eat a balanced diet with plenty of fruits and vegetables. Blood tests for lipids and cholesterol should begin at age 52 and be  repeated every 5 years. If your lipid or cholesterol levels are high, you are over 50, or you are a high risk for heart disease, you may need your cholesterol levels checked more frequently.Ongoing high lipid and cholesterol levels should be treated with medicines if diet and exercise are not effective.  If you smoke, find out from your caregiver how to quit. If you do not use tobacco, do not start.  Lung cancer screening is recommended for adults aged 37 80 years who are at high risk for developing lung cancer because of a history of smoking. Yearly low-dose computed tomography (CT) is recommended for people who have at least a 30-pack-year history of smoking and are a current smoker or have quit within the past 15 years. A pack year of smoking is smoking an average of 1 pack of cigarettes a day for 1 year (for example: 1 pack a day for 30 years or 2 packs a day for 15 years). Yearly screening should continue until the smoker has stopped smoking for at least 15 years. Yearly screening should also be stopped for people who develop a health problem that would prevent them from having lung cancer treatment.  If you are pregnant, do not drink alcohol. If you are breastfeeding, be very cautious about drinking alcohol. If you are not pregnant and choose to drink alcohol, do not exceed 1 drink per day. One drink is considered to be 12 ounces (355 mL) of beer, 5 ounces (148 mL) of wine, or 1.5 ounces (44 mL) of  liquor.  Avoid use of street drugs. Do not share needles with anyone. Ask for help if you need support or instructions about stopping the use of drugs.  High blood pressure causes heart disease and increases the risk of stroke. Blood pressure should be checked at least every 1 to 2 years. Ongoing high blood pressure should be treated with medicines, if weight loss and exercise are not effective.  If you are 81 to 50 years old, ask your caregiver if you should take aspirin to prevent strokes.  Diabetes  screening involves taking a blood sample to check your fasting blood sugar level. This should be done once every 3 years, after age 85, if you are within normal weight and without risk factors for diabetes. Testing should be considered at a younger age or be carried out more frequently if you are overweight and have at least 1 risk factor for diabetes.  Breast cancer screening is essential preventative care for women. You should practice "breast self-awareness." This means understanding the normal appearance and feel of your breasts and may include breast self-examination. Any changes detected, no matter how small, should be reported to a caregiver. Women in their 55s and 30s should have a clinical breast exam (CBE) by a caregiver as part of a regular health exam every 1 to 3 years. After age 50, women should have a CBE every year. Starting at age 53, women should consider having a mammogram (breast X-ray) every year. Women who have a family history of breast cancer should talk to their caregiver about genetic screening. Women at a high risk of breast cancer should talk to their caregiver about having an MRI and a mammogram every year.  Breast cancer gene (BRCA)-related cancer risk assessment is recommended for women who have family members with BRCA-related cancers. BRCA-related cancers include breast, ovarian, tubal, and peritoneal cancers. Having family members with these cancers may be associated with an increased risk for harmful changes (mutations) in the breast cancer genes BRCA1 and BRCA2. Results of the assessment will determine the need for genetic counseling and BRCA1 and BRCA2 testing.  The Pap test is a screening test for cervical cancer. Women should have a Pap test starting at age 37. Between ages 4 and 80, Pap tests should be repeated every 2 years. Beginning at age 60, you should have a Pap test every 3 years as long as the past 3 Pap tests have been normal. If you had a hysterectomy for a  problem that was not cancer or a condition that could lead to cancer, then you no longer need Pap tests. If you are between ages 13 and 68, and you have had normal Pap tests going back 10 years, you no longer need Pap tests. If you have had past treatment for cervical cancer or a condition that could lead to cancer, you need Pap tests and screening for cancer for at least 20 years after your treatment. If Pap tests have been discontinued, risk factors (such as a new sexual partner) need to be reassessed to determine if screening should be resumed. Some women have medical problems that increase the chance of getting cervical cancer. In these cases, your caregiver may recommend more frequent screening and Pap tests.  The human papillomavirus (HPV) test is an additional test that may be used for cervical cancer screening. The HPV test looks for the virus that can cause the cell changes on the cervix. The cells collected during the Pap test can be tested for  HPV. The HPV test could be used to screen women aged 11 years and older, and should be used in women of any age who have unclear Pap test results. After the age of 48, women should have HPV testing at the same frequency as a Pap test.  Colorectal cancer can be detected and often prevented. Most routine colorectal cancer screening begins at the age of 44 and continues through age 86. However, your caregiver may recommend screening at an earlier age if you have risk factors for colon cancer. On a yearly basis, your caregiver may provide home test kits to check for hidden blood in the stool. Use of a small camera at the end of a tube, to directly examine the colon (sigmoidoscopy or colonoscopy), can detect the earliest forms of colorectal cancer. Talk to your caregiver about this at age 30, when routine screening begins. Direct examination of the colon should be repeated every 5 to 10 years through age 74, unless early forms of pre-cancerous polyps or small growths  are found.  Hepatitis C blood testing is recommended for all people born from [birth year] through 1967 and any individual with known risks for hepatitis C.  Practice safe sex. Use condoms and avoid high-risk sexual practices to reduce the spread of sexually transmitted infections (STIs). Sexually active women aged 42 and younger should be checked for Chlamydia, which is a common sexually transmitted infection. Older women with new or multiple partners should also be tested for Chlamydia. Testing for other STIs is recommended if you are sexually active and at increased risk.  Osteoporosis is a disease in which the bones lose minerals and strength with aging. This can result in serious bone fractures. The risk of osteoporosis can be identified using a bone density scan. Women ages 70 and over and women at risk for fractures or osteoporosis should discuss screening with their caregivers. Ask your caregiver whether you should be taking a calcium supplement or vitamin D to reduce the rate of osteoporosis.  Menopause can be associated with physical symptoms and risks. Hormone replacement therapy is available to decrease symptoms and risks. You should talk to your caregiver about whether hormone replacement therapy is right for you.  Use sunscreen. Apply sunscreen liberally and repeatedly throughout the day. You should seek shade when your shadow is shorter than you. Protect yourself by wearing long sleeves, pants, a wide-brimmed hat, and sunglasses year round, whenever you are outdoors.  Notify your caregiver of new moles or changes in moles, especially if there is a change in shape or color. Also notify your caregiver if a mole is larger than the size of a pencil eraser.  Stay current with your immunizations. Document Released: 06/21/2011 Document Revised: 04/02/2013 Document Reviewed: 06/21/2011 [facility] Patient Information 2014 [place].

## 2014-05-30 NOTE — Progress Notes (Signed)
  Subjective:    Patient ID: Dawn Cole, female    DOB: 1966/03/28, 48 y.o.   MRN: [id number]  HPI  48 year old patient who is seen today for a health maintenance examination.  There is She enjoys excellent health. She does have a history of mild dyslipidemia and treated hypertension. No concerns or complaints today.  Family history remarkable for colon cancer. Her mother had colon cancer diagnosed at age 50 and diagnosed with pancreatic cancer at 50. One brother also died of complicates the pancreatic cancer 50.  A second brother died of metastatic lung cancer, who was a heavy smoker Father died in his 1s of an apparent primary brain neoplasm  She had a screening colonoscopy 2013.  She is followed by gynecology annually with mammograms  Social history married nonsmoker; works at [employer];  70-year-old granddaughter   Review of Systems  Constitutional: Negative.   HENT: Negative for congestion, dental problem, hearing loss, rhinorrhea, sinus pressure, sore throat and tinnitus.   Eyes: Negative for pain, discharge and visual disturbance.  Respiratory: Negative for cough and shortness of breath.   Cardiovascular: Negative for chest pain, palpitations and leg swelling.  Gastrointestinal: Negative for nausea, vomiting, abdominal pain, diarrhea, constipation, blood in stool and abdominal distention.  Genitourinary: Negative for dysuria, urgency, frequency, hematuria, flank pain, vaginal bleeding, vaginal discharge, difficulty urinating, vaginal pain and pelvic pain.  Musculoskeletal: Negative for arthralgias, gait problem and joint swelling.  Skin: Negative for rash.  Neurological: Negative for dizziness, syncope, speech difficulty, weakness, numbness and headaches.  Hematological: Negative for adenopathy.  Psychiatric/Behavioral: Negative for behavioral problems, dysphoric mood and agitation. The patient is not nervous/anxious.        Objective:   Physical Exam   Constitutional: She is oriented to person, place, and time. She appears well-developed and well-nourished.  HENT:  Head: Normocephalic.  Right Ear: External ear normal.  Left Ear: External ear normal.  Mouth/Throat: Oropharynx is clear and moist.  Eyes: Conjunctivae and EOM are normal. Pupils are equal, round, and reactive to light.  Neck: Normal range of motion. Neck supple. No thyromegaly present.  Cardiovascular: Normal rate, regular rhythm, normal heart sounds and intact distal pulses.   Pulmonary/Chest: Effort normal and breath sounds normal.  Abdominal: Soft. Bowel sounds are normal. She exhibits no mass. There is no tenderness.  Musculoskeletal: Normal range of motion.  Lymphadenopathy:    She has no cervical adenopathy.  Neurological: She is alert and oriented to person, place, and time.  Skin: Skin is warm and dry. No rash noted.  Psychiatric: She has a normal mood and affect. Her behavior is normal.          Assessment & Plan:   Preventive health examination Hypertension well controlled Mild exogenous obesity. Weight loss encouraged  No change in medications Exercise encouraged Home blood pressure monitoring recommended Recheck 1 year

## 2014-05-31 ENCOUNTER — Telehealth: Payer: Self-pay | Admitting: Internal Medicine

## 2014-05-31 NOTE — Telephone Encounter (Signed)
Relevant patient education mailed to patient.  

## 2014-06-26 ENCOUNTER — Other Ambulatory Visit: Payer: Self-pay

## 2014-06-26 DIAGNOSIS — Z1231 Encounter for screening mammogram for malignant neoplasm of breast: Secondary | ICD-10-CM

## 2014-07-09 ENCOUNTER — Ambulatory Visit
Admission: RE | Admit: 2014-07-09 | Discharge: 2014-07-09 | Disposition: A | Discharge status: Home / Self Care | Payer: BC Managed Care – PPO | Source: Ambulatory Visit

## 2014-07-09 DIAGNOSIS — Z1231 Encounter for screening mammogram for malignant neoplasm of breast: Secondary | ICD-10-CM

## 2014-08-05 ENCOUNTER — Telehealth: Payer: Self-pay | Admitting: Internal Medicine

## 2014-08-05 MED ORDER — AMLODIPINE BESY-BENAZEPRIL HCL 5-10 MG PO CAPS: 1.0000 | ORAL_CAPSULE | Freq: Every day | ORAL | Status: DC

## 2014-08-05 NOTE — Telephone Encounter (Signed)
Pt needs refill on amlodipine-benazepril 5-10 mg #90 w.[facility]ills [facility]

## 2014-08-05 NOTE — Telephone Encounter (Signed)
Rx sent 

## 2015-02-17 ENCOUNTER — Telehealth: Payer: Self-pay | Admitting: Internal Medicine

## 2015-02-17 MED ORDER — AMLODIPINE BESY-BENAZEPRIL HCL 5-10 MG PO CAPS: 1.0000 | ORAL_CAPSULE | Freq: Every day | ORAL | Status: DC

## 2015-02-17 NOTE — Telephone Encounter (Signed)
Pt request refill of the following: amLODipine-benazepril (LOTREL) 5-10 MG per capsule  Pt physical has been scheduled   Phamacy: [facility]

## 2015-02-17 NOTE — Telephone Encounter (Signed)
Pt notified Rx was sent to [facility].

## 2015-05-27 ENCOUNTER — Other Ambulatory Visit (INDEPENDENT_AMBULATORY_CARE_PROVIDER_SITE_OTHER): Payer: BLUE CROSS/BLUE SHIELD

## 2015-05-27 DIAGNOSIS — Z0000 Encounter for general adult medical examination without abnormal findings: Secondary | ICD-10-CM | POA: Diagnosis not present

## 2015-05-27 LAB — CBC WITH DIFFERENTIAL/PLATELET
Basophils Absolute: 0 10*3/uL (ref 0.0–0.1)
Basophils Relative: 0.4 % (ref 0.0–3.0)
EOS PCT: 2.5 % (ref 0.0–5.0)
Eosinophils Absolute: 0.2 10*3/uL (ref 0.0–0.7)
HCT: 42.3 % (ref 36.0–46.0)
Hemoglobin: 14.2 g/dL (ref 12.0–15.0)
Lymphocytes Relative: 22 % (ref 12.0–46.0)
Lymphs Abs: 1.9 10*3/uL (ref 0.7–4.0)
MCHC: 33.6 g/dL (ref 30.0–36.0)
MCV: 87.3 fl (ref 78.0–100.0)
Monocytes Absolute: 0.6 10*3/uL (ref 0.1–1.0)
Monocytes Relative: 6.5 % (ref 3.0–12.0)
NEUTROS PCT: 68.6 % (ref 43.0–77.0)
Neutro Abs: 6 10*3/uL (ref 1.4–7.7)
Platelets: 254 10*3/uL (ref 150.0–400.0)
RBC: 4.85 Mil/uL (ref 3.87–5.11)
RDW: 12.7 % (ref 11.5–15.5)
WBC: 8.7 10*3/uL (ref 4.0–10.5)

## 2015-05-27 LAB — POCT URINALYSIS DIPSTICK
Bilirubin, UA: NEGATIVE
GLUCOSE UA: NEGATIVE
Ketones, UA: NEGATIVE
Leukocytes, UA: NEGATIVE
Nitrite, UA: NEGATIVE
Protein, UA: NEGATIVE
RBC UA: NEGATIVE
Spec Grav, UA: 1.02
Urobilinogen, UA: 0.2
pH, UA: 6

## 2015-05-27 LAB — HEPATIC FUNCTION PANEL
ALBUMIN: 4.3 g/dL (ref 3.5–5.2)
ALT: 17 U/L (ref 0–35)
AST: 19 U/L (ref 0–37)
Alkaline Phosphatase: 66 U/L (ref 39–117)
BILIRUBIN TOTAL: 0.6 mg/dL (ref 0.2–1.2)
Bilirubin, Direct: 0.1 mg/dL (ref 0.0–0.3)
TOTAL PROTEIN: 7.3 g/dL (ref 6.0–8.3)

## 2015-05-27 LAB — LIPID PANEL
CHOL/HDL RATIO: 5
Cholesterol: 209 mg/dL — ABNORMAL HIGH (ref 0–200)
HDL: 46.1 mg/dL (ref >39.00)
LDL Cholesterol: 136 mg/dL — ABNORMAL HIGH (ref 0–99)
NONHDL: 162.9
Triglycerides: 135 mg/dL (ref 0.0–149.0)
VLDL: 27 mg/dL (ref 0.0–40.0)

## 2015-05-27 LAB — BASIC METABOLIC PANEL
BUN: 16 mg/dL (ref 6–23)
CALCIUM: 9.5 mg/dL (ref 8.4–10.5)
CO2: 28 meq/L (ref 19–32)
Chloride: 104 mEq/L (ref 96–112)
Creatinine, Ser: 0.75 mg/dL (ref 0.40–1.20)
GFR: 86.67 mL/min (ref >60.00)
GLUCOSE: 95 mg/dL (ref 70–99)
Potassium: 5.1 mEq/L (ref 3.5–5.1)
Sodium: 137 mEq/L (ref 135–145)

## 2015-05-27 LAB — TSH: TSH: 1.98 u[IU]/mL (ref 0.35–4.50)

## 2015-06-02 ENCOUNTER — Ambulatory Visit (INDEPENDENT_AMBULATORY_CARE_PROVIDER_SITE_OTHER): Payer: BLUE CROSS/BLUE SHIELD | Admitting: Internal Medicine

## 2015-06-02 ENCOUNTER — Encounter: Payer: Self-pay | Admitting: Internal Medicine

## 2015-06-02 VITALS — BP 110/70 | HR 80 | Temp 97.9°F | Resp 20 | Ht 63.0 in | Wt 172.0 lb

## 2015-06-02 DIAGNOSIS — Z0000 Encounter for general adult medical examination without abnormal findings: Secondary | ICD-10-CM

## 2015-06-02 DIAGNOSIS — E785 Hyperlipidemia, unspecified: Secondary | ICD-10-CM

## 2015-06-02 DIAGNOSIS — I10 Essential (primary) hypertension: Secondary | ICD-10-CM

## 2015-06-02 NOTE — Progress Notes (Signed)
Pre visit review using our clinic review tool, if applicable. No additional management support is needed unless otherwise documented below in the visit note. 

## 2015-06-02 NOTE — Progress Notes (Signed)
  Subjective:    Patient ID: Dawn Cole, female    DOB: April 05, 1966, 49 y.o.   MRN: [id number]  HPI 49 -year-old patient who is seen today for a health maintenance examination.  There is She enjoys excellent health. She does have a history of mild dyslipidemia and treated hypertension. No concerns or complaints today.  The patient is exercising regularly and attempting to lose weight.  Blood pressure has been low normal.  She wishes to consider a challenge off blood pressure medication  Family history remarkable for colon cancer. Her mother had colon cancer diagnosed at age 45 and diagnosed with pancreatic cancer at 63. One brother also died of complicates the pancreatic cancer 13.  A second brother died of metastatic lung cancer, who was a heavy smoker Father died in his 51s of an apparent primary brain neoplasm  She had a screening colonoscopy 2013.  She is followed by gynecology annually with mammograms  Social history married nonsmoker; works at [employer];  51-year-old granddaughter   Review of Systems  Constitutional: Negative.   HENT: Negative for congestion, dental problem, hearing loss, rhinorrhea, sinus pressure, sore throat and tinnitus.   Eyes: Negative for pain, discharge and visual disturbance.  Respiratory: Negative for cough and shortness of breath.   Cardiovascular: Negative for chest pain, palpitations and leg swelling.  Gastrointestinal: Negative for nausea, vomiting, abdominal pain, diarrhea, constipation, blood in stool and abdominal distention.  Genitourinary: Negative for dysuria, urgency, frequency, hematuria, flank pain, vaginal bleeding, vaginal discharge, difficulty urinating, vaginal pain and pelvic pain.  Musculoskeletal: Negative for joint swelling, arthralgias and gait problem.  Skin: Negative for rash.  Neurological: Negative for dizziness, syncope, speech difficulty, weakness, numbness and headaches.  Hematological: Negative for adenopathy.   Psychiatric/Behavioral: Negative for behavioral problems, dysphoric mood and agitation. The patient is not nervous/anxious.        Objective:   Physical Exam  Constitutional: She is oriented to person, place, and time. She appears well-developed and well-nourished.  HENT:  Head: Normocephalic.  Right Ear: External ear normal.  Left Ear: External ear normal.  Mouth/Throat: Oropharynx is clear and moist.  Eyes: Conjunctivae and EOM are normal. Pupils are equal, round, and reactive to light.  Neck: Normal range of motion. Neck supple. No thyromegaly present.  Cardiovascular: Normal rate, regular rhythm, normal heart sounds and intact distal pulses.   Pulmonary/Chest: Effort normal and breath sounds normal.  Abdominal: Soft. Bowel sounds are normal. She exhibits no mass. There is no tenderness.  Musculoskeletal: Normal range of motion.  Lymphadenopathy:    She has no cervical adenopathy.  Neurological: She is alert and oriented to person, place, and time.  Skin: Skin is warm and dry. No rash noted.  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 Her behavior is normal.          Assessment & Plan:   Preventive health examination Hypertension well controlled.  The patient will decrease her blood pressure medication.  2 every other day regimen for 2 weeks and follow blood pressures carefully.  If blood pressure is greater than 140 over 90.  We'll resume medication Mild exogenous obesity. Weight loss encouraged  No change in medications (trial off medication as noted above) Exercise encouraged Home blood pressure monitoring recommended Recheck 1 year Dermatology referral

## 2015-06-02 NOTE — Patient Instructions (Addendum)
Dermatology follow-up  Limit your sodium (Salt) intake    It is important that you exercise regularly, at least 20 minutes 3 to 4 times per week.  If you develop chest pain or shortness of breath seek  medical attention.  You need to lose weight.  Consider a lower calorie diet and regular exercise.  Please check your blood pressure on a regular basis.  If it is consistently greater than 140/90, please make an office appointment.  (Resume  medication)  Health Maintenance Adopting a healthy lifestyle and getting preventive care can go a long way to promote health and wellness. Talk with your health care provider about what schedule of regular examinations is right for you. This is a good chance for you to check in with your provider about disease prevention and staying healthy. In between checkups, there are plenty of things you can do on your own. Experts have done a lot of research about which lifestyle changes and preventive measures are most likely to keep you healthy. Ask your health care provider for more information. WEIGHT AND DIET  Eat a healthy diet  Be sure to include plenty of vegetables, fruits, low-fat dairy products, and lean protein.  Do not eat a lot of foods high in solid fats, added sugars, or salt.  Get regular exercise. This is one of the most important things you can do for your health.  Most adults should exercise for at least 150 minutes each week. The exercise should increase your heart rate and make you sweat (moderate-intensity exercise).  Most adults should also do strengthening exercises at least twice a week. This is in addition to the moderate-intensity exercise.  Maintain a healthy weight  Body mass index (BMI) is a measurement that can be used to identify possible weight problems. It estimates body fat based on height and weight. Your health care provider can help determine your BMI and help you achieve or maintain a healthy weight.  For females 77 years  of age and older:   A BMI below 18.5 is considered underweight.  A BMI of 18.5 to 24.9 is normal.  A BMI of 25 to 29.9 is considered overweight.  A BMI of 30 and above is considered obese.  Watch levels of cholesterol and blood lipids  You should start having your blood tested for lipids and cholesterol at 51 years of age, then have this test every 5 years.  You may need to have your cholesterol levels checked more often if:  Your lipid or cholesterol levels are high.  You are older than 51 years of age.  You are at high risk for heart disease.  CANCER SCREENING   Lung Cancer  Lung cancer screening is recommended for adults 51-70 years old who are at high risk for lung cancer because of a history of smoking.  A yearly low-dose CT scan of the lungs is recommended for people who:  Currently smoke.  Have quit within the past 15 years.  Have at least a 30-pack-year history of smoking. A pack year is smoking an average of one pack of cigarettes a day for 1 year.  Yearly screening should continue until it has been 15 years since you quit.  Yearly screening should stop if you develop a health problem that would prevent you from having lung cancer treatment.  Breast Cancer  Practice breast self-awareness. This means understanding how your breasts normally appear and feel.  It also means doing regular breast self-exams. Let your health care  provider know about any changes, no matter how small.  If you are in your 20s or 30s, you should have a clinical breast exam (CBE) by a health care provider every 1-3 years as part of a regular health exam.  If you are 51 or older, have a CBE every year. Also consider having a breast X-ray (mammogram) every year.  If you have a family history of breast cancer, talk to your health care provider about genetic screening.  If you are at high risk for breast cancer, talk to your health care provider about having an MRI and a mammogram every  year.  Breast cancer gene (BRCA) assessment is recommended for women who have family members with BRCA-related cancers. BRCA-related cancers include:  Breast.  Ovarian.  Tubal.  Peritoneal cancers.  Results of the assessment will determine the need for genetic counseling and BRCA1 and BRCA2 testing. Cervical Cancer Routine pelvic examinations to screen for cervical cancer are no longer recommended for nonpregnant women who are considered low risk for cancer of the pelvic organs (ovaries, uterus, and vagina) and who do not have symptoms. A pelvic examination may be necessary if you have symptoms including those associated with pelvic infections. Ask your health care provider if a screening pelvic exam is right for you.   The Pap test is the screening test for cervical cancer for women who are considered at risk.  If you had a hysterectomy for a problem that was not cancer or a condition that could lead to cancer, then you no longer need Pap tests.  If you are older than 65 years, and you have had normal Pap tests for the past 10 years, you no longer need to have Pap tests.  If you have had past treatment for cervical cancer or a condition that could lead to cancer, you need Pap tests and screening for cancer for at least 20 years after your treatment.  If you no longer get a Pap test, assess your risk factors if they change (such as having a new sexual partner). This can affect whether you should start being screened again.  Some women have medical problems that increase their chance of getting cervical cancer. If this is the case for you, your health care provider may recommend more frequent screening and Pap tests.  The human papillomavirus (HPV) test is another test that may be used for cervical cancer screening. The HPV test looks for the virus that can cause cell changes in the cervix. The cells collected during the Pap test can be tested for HPV.  The HPV test can be used to screen  women 4 years of age and older. Getting tested for HPV can extend the interval between normal Pap tests from three to five years.  An HPV test also should be used to screen women of any age who have unclear Pap test results.  After 51 years of age, women should have HPV testing as often as Pap tests.  Colorectal Cancer  This type of cancer can be detected and often prevented.  Routine colorectal cancer screening usually begins at 51 years of age and continues through 51 years of age.  Your health care provider may recommend screening at an earlier age if you have risk factors for colon cancer.  Your health care provider may also recommend using home test kits to check for hidden blood in the stool.  A small camera at the end of a tube can be used to examine your colon  directly (sigmoidoscopy or colonoscopy). This is done to check for the earliest forms of colorectal cancer.  Routine screening usually begins at age 2.  Direct examination of the colon should be repeated every 5-10 years through 51 years of age. However, you may need to be screened more often if early forms of precancerous polyps or small growths are found. Skin Cancer  Check your skin from head to toe regularly.  Tell your health care provider about any new moles or changes in moles, especially if there is a change in a mole's shape or color.  Also tell your health care provider if you have a mole that is larger than the size of a pencil eraser.  Always use sunscreen. Apply sunscreen liberally and repeatedly throughout the day.  Protect yourself by wearing long sleeves, pants, a wide-brimmed hat, and sunglasses whenever you are outside. HEART DISEASE, DIABETES, AND HIGH BLOOD PRESSURE   Have your blood pressure checked at least every 1-2 years. High blood pressure causes heart disease and increases the risk of stroke.  If you are between 17 years and 65 years old, ask your health care provider if you should take  aspirin to prevent strokes.  Have regular diabetes screenings. This involves taking a blood sample to check your fasting blood sugar level.  If you are at a normal weight and have a low risk for diabetes, have this test once every three years after 51 years of age.  If you are overweight and have a high risk for diabetes, consider being tested at a younger age or more often. PREVENTING INFECTION  Hepatitis B  If you have a higher risk for hepatitis B, you should be screened for this virus. You are considered at high risk for hepatitis B if:  You were born in a country where hepatitis B is common. Ask your health care provider which countries are considered high risk.  Your parents were born in a high-risk country, and you have not been immunized against hepatitis B (hepatitis B vaccine).  You have HIV or AIDS.  You use needles to inject street drugs.  You live with someone who has hepatitis B.  You have had sex with someone who has hepatitis B.  You get hemodialysis treatment.  You take certain medicines for conditions, including cancer, organ transplantation, and autoimmune conditions. Hepatitis C  Blood testing is recommended for:  Everyone born from [birth year] through 1967.  Anyone with known risk factors for hepatitis C. Sexually transmitted infections (STIs)  You should be screened for sexually transmitted infections (STIs) including gonorrhea and chlamydia if:  You are sexually active and are younger than 51 years of age.  You are older than 51 years of age and your health care provider tells you that you are at risk for this type of infection.  Your sexual activity has changed since you were last screened and you are at an increased risk for chlamydia or gonorrhea. Ask your health care provider if you are at risk.  If you do not have HIV, but are at risk, it may be recommended that you take a prescription medicine daily to prevent HIV infection. This is called  pre-exposure prophylaxis (PrEP). You are considered at risk if:  You are sexually active and do not regularly use condoms or know the HIV status of your partner(s).  You take drugs by injection.  You are sexually active with a partner who has HIV. Talk with your health care provider about whether you are  at high risk of being infected with HIV. If you choose to begin PrEP, you should first be tested for HIV. You should then be tested every 3 months for as long as you are taking PrEP.  PREGNANCY   If you are premenopausal and you may become pregnant, ask your health care provider about preconception counseling.  If you may become pregnant, take 400 to 800 micrograms (mcg) of folic acid every day.  If you want to prevent pregnancy, talk to your health care provider about birth control (contraception). OSTEOPOROSIS AND MENOPAUSE   Osteoporosis is a disease in which the bones lose minerals and strength with aging. This can result in serious bone fractures. Your risk for osteoporosis can be identified using a bone density scan.  If you are 65 years of age or older, or if you are at risk for osteoporosis and fractures, ask your health care provider if you should be screened.  Ask your health care provider whether you should take a calcium or vitamin D supplement to lower your risk for osteoporosis.  Menopause may have certain physical symptoms and risks.  Hormone replacement therapy may reduce some of these symptoms and risks. Talk to your health care provider about whether hormone replacement therapy is right for you.  HOME CARE INSTRUCTIONS   Schedule regular health, dental, and eye exams.  Stay current with your immunizations.   Do not use any tobacco products including cigarettes, chewing tobacco, or electronic cigarettes.  If you are pregnant, do not drink alcohol.  If you are breastfeeding, limit how much and how often you drink alcohol.  Limit alcohol intake to no more than 1  drink per day for nonpregnant women. One drink equals 12 ounces of beer, 5 ounces of wine, or 1 ounces of hard liquor.  Do not use street drugs.  Do not share needles.  Ask your health care provider for help if you need support or information about quitting drugs.  Tell your health care provider if you often feel depressed.  Tell your health care provider if you have ever been abused or do not feel safe at home. Document Released: 06/21/2011 Document Revised: 04/22/2014 Document Reviewed: 11/07/2013 [employer] Patient Information 2015 [employer]. This information is not intended to replace advice given to you by your health care provider. Make sure you discuss any questions you have with your health care provider.  

## 2015-06-17 ENCOUNTER — Other Ambulatory Visit: Payer: Self-pay | Admitting: Internal Medicine

## 2015-06-24 ENCOUNTER — Telehealth: Payer: Self-pay | Admitting: Internal Medicine

## 2015-06-24 MED ORDER — AMLODIPINE BESY-BENAZEPRIL HCL 5-10 MG PO CAPS: ORAL_CAPSULE | ORAL | Status: DC

## 2015-06-24 NOTE — Telephone Encounter (Signed)
Pt request refill of the following: amLODipine-benazepril (LOTREL) 5-10 MG per capsule   Pt said she need a few pills called in to the pharmacy until she received her meds from [facility] .    Phamacy: [facility]

## 2015-06-24 NOTE — Telephone Encounter (Signed)
Spoke to pt, told her Rx sent to local pharmacy for 15 pills. Pt verbalized understanding and stated she needs refill sent to [facility] they never got one. Told pt will send refill to [facility] also. Pt verbalized understanding. Rx sent.

## 2015-10-01 ENCOUNTER — Other Ambulatory Visit: Payer: Self-pay

## 2015-10-01 DIAGNOSIS — Z1231 Encounter for screening mammogram for malignant neoplasm of breast: Secondary | ICD-10-CM

## 2015-10-10 ENCOUNTER — Ambulatory Visit
Admission: RE | Admit: 2015-10-10 | Discharge: 2015-10-10 | Disposition: A | Discharge status: Home / Self Care | Payer: BLUE CROSS/BLUE SHIELD | Source: Ambulatory Visit

## 2015-10-10 DIAGNOSIS — Z1231 Encounter for screening mammogram for malignant neoplasm of breast: Secondary | ICD-10-CM

## 2016-06-23 DIAGNOSIS — D2262 Melanocytic nevi of left upper limb, including shoulder: Secondary | ICD-10-CM | POA: Diagnosis not present

## 2016-06-23 DIAGNOSIS — L218 Other seborrheic dermatitis: Secondary | ICD-10-CM | POA: Diagnosis not present

## 2016-06-23 DIAGNOSIS — D2261 Melanocytic nevi of right upper limb, including shoulder: Secondary | ICD-10-CM | POA: Diagnosis not present

## 2016-06-23 DIAGNOSIS — Z85828 Personal history of other malignant neoplasm of skin: Secondary | ICD-10-CM | POA: Diagnosis not present

## 2016-06-23 DIAGNOSIS — B078 Other viral warts: Secondary | ICD-10-CM | POA: Diagnosis not present

## 2016-08-19 ENCOUNTER — Other Ambulatory Visit: Payer: Self-pay | Admitting: Internal Medicine

## 2016-08-19 ENCOUNTER — Telehealth: Payer: Self-pay | Admitting: Internal Medicine

## 2016-08-19 MED ORDER — AMLODIPINE BESY-BENAZEPRIL HCL 5-10 MG PO CAPS: ORAL_CAPSULE | ORAL | 1 refills | Status: DC

## 2016-08-19 NOTE — Telephone Encounter (Signed)
Rx refill sent to [facility].

## 2016-08-19 NOTE — Telephone Encounter (Signed)
° °  Pt has been scheduled for her physical in Jan 2018    Pt req refill   amLODipine-benazepril (LOTREL) 5-10 MG per capsule     Pharmacy [facility]

## 2016-09-22 ENCOUNTER — Other Ambulatory Visit: Payer: Self-pay | Admitting: Internal Medicine

## 2016-09-22 DIAGNOSIS — Z1231 Encounter for screening mammogram for malignant neoplasm of breast: Secondary | ICD-10-CM

## 2016-10-08 DIAGNOSIS — Z23 Encounter for immunization: Secondary | ICD-10-CM | POA: Diagnosis not present

## 2016-10-11 ENCOUNTER — Ambulatory Visit
Admission: RE | Admit: 2016-10-11 | Discharge: 2016-10-11 | Disposition: A | Discharge status: Home / Self Care | Payer: BLUE CROSS/BLUE SHIELD | Source: Ambulatory Visit | Attending: Internal Medicine | Admitting: Internal Medicine

## 2016-10-11 DIAGNOSIS — Z1231 Encounter for screening mammogram for malignant neoplasm of breast: Secondary | ICD-10-CM

## 2016-10-27 ENCOUNTER — Ambulatory Visit (INDEPENDENT_AMBULATORY_CARE_PROVIDER_SITE_OTHER): Payer: BLUE CROSS/BLUE SHIELD | Admitting: Family Medicine

## 2016-10-27 VITALS — BP 120/80 | HR 89 | Temp 98.2°F | Ht 63.0 in | Wt 171.0 lb

## 2016-10-27 DIAGNOSIS — J329 Chronic sinusitis, unspecified: Secondary | ICD-10-CM

## 2016-10-27 MED ORDER — AMOXICILLIN-POT CLAVULANATE 875-125 MG PO TABS: 1.0000 | ORAL_TABLET | Freq: Two times a day (BID) | ORAL | 0 refills | Status: DC

## 2016-10-27 NOTE — Patient Instructions (Signed)
Stay well hydrated Consider nasal irrigation with saline (Netti pot) Start the antibiotic for any fever or progressive facial pain or headaches.

## 2016-10-27 NOTE — Progress Notes (Signed)
Pre visit review using our clinic review tool, if applicable. No additional management support is needed unless otherwise documented below in the visit note. 

## 2016-10-27 NOTE — Progress Notes (Signed)
Subjective:     Patient ID: Dawn Cole, female   DOB: Aug 05, 1966, 50 y.o.   MRN: [id number]  HPI Acute visit for sinus congestion facial pain and headaches. Onset a few days ago. She has nasal congestion worse on the right side. She's had some right maxillary pressure and discomfort and mild headache. No fever. No chills. Took over-the-counter Mucinex without much improvement. Symptoms are worse at night. Only minimal dry cough. No bloody nasal discharge. No purulent nasal discharge.  Past Medical History:  Diagnosis Date  . HYPERLIPIDEMIA 10/09/2007  . HYPERTENSION 01/09/2009   Past Surgical History:  Procedure Laterality Date  . ABDOMINAL HYSTERECTOMY      reports that she has never smoked. She does not have any smokeless tobacco history on file. She reports that she does not drink alcohol or use drugs. family history is not on file. No Known Allergies   Review of Systems  Constitutional: Positive for fatigue. Negative for chills and fever.  HENT: Positive for congestion, sinus pain and sinus pressure.   Respiratory: Positive for cough.        Objective:   Physical Exam  Constitutional: She appears well-developed and well-nourished.  HENT:  Right Ear: External ear normal.  Left Ear: External ear normal.  Nose: Nose normal.  Mouth/Throat: Oropharynx is clear and moist.  Neck: Neck supple.  Cardiovascular: Normal rate and regular rhythm.   Pulmonary/Chest: Effort normal and breath sounds normal. No respiratory distress. She has no wheezes. She has no rales.  Lymphadenopathy:    She has no cervical adenopathy.       Assessment:     Acute sinus congestion    Plan:     -Do not recommend antibiotics at this time. If she has any fever or progressive sinusitis symptoms consider course of Augmentin. Prescription was printed out and signed and will only get filled if symptoms progress -Continue with over-the-counter plain Mucinex -Avoid decongestants with her history of  hypertension -Consider nasal irrigation with saline and we discussed the importance of using distilled water for that and not tap water  Eulas Post MD [facility]

## 2016-12-20 HISTORY — PX: MELANOMA EXCISION: SHX5266

## 2016-12-23 ENCOUNTER — Other Ambulatory Visit (INDEPENDENT_AMBULATORY_CARE_PROVIDER_SITE_OTHER): Payer: BLUE CROSS/BLUE SHIELD

## 2016-12-23 DIAGNOSIS — Z0000 Encounter for general adult medical examination without abnormal findings: Secondary | ICD-10-CM | POA: Diagnosis not present

## 2016-12-23 LAB — CBC WITH DIFFERENTIAL/PLATELET
Basophils Absolute: 0 10*3/uL (ref 0.0–0.1)
Basophils Relative: 0.3 % (ref 0.0–3.0)
EOS PCT: 3.8 % (ref 0.0–5.0)
Eosinophils Absolute: 0.3 10*3/uL (ref 0.0–0.7)
HCT: 41.6 % (ref 36.0–46.0)
HEMOGLOBIN: 14.2 g/dL (ref 12.0–15.0)
Lymphocytes Relative: 28.3 % (ref 12.0–46.0)
Lymphs Abs: 2.2 10*3/uL (ref 0.7–4.0)
MCHC: 34.2 g/dL (ref 30.0–36.0)
MCV: 87.2 fl (ref 78.0–100.0)
Monocytes Absolute: 0.6 10*3/uL (ref 0.1–1.0)
Monocytes Relative: 7.1 % (ref 3.0–12.0)
Neutro Abs: 4.8 10*3/uL (ref 1.4–7.7)
Neutrophils Relative %: 60.5 % (ref 43.0–77.0)
Platelets: 259 10*3/uL (ref 150.0–400.0)
RBC: 4.77 Mil/uL (ref 3.87–5.11)
RDW: 12.6 % (ref 11.5–15.5)
WBC: 7.9 10*3/uL (ref 4.0–10.5)

## 2016-12-23 LAB — POC URINALSYSI DIPSTICK (AUTOMATED)
Bilirubin, UA: NEGATIVE
Blood, UA: NEGATIVE
Glucose, UA: NEGATIVE
KETONES UA: NEGATIVE
LEUKOCYTES UA: NEGATIVE
NITRITE UA: NEGATIVE
PH UA: 6.5
PROTEIN UA: NEGATIVE
Spec Grav, UA: 1.01
Urobilinogen, UA: 0.2

## 2016-12-23 LAB — BASIC METABOLIC PANEL
BUN: 15 mg/dL (ref 6–23)
CO2: 29 mEq/L (ref 19–32)
Calcium: 9.5 mg/dL (ref 8.4–10.5)
Chloride: 105 mEq/L (ref 96–112)
Creatinine, Ser: 0.68 mg/dL (ref 0.40–1.20)
GFR: 96.45 mL/min (ref >60.00)
GLUCOSE: 97 mg/dL (ref 70–99)
POTASSIUM: 4.4 meq/L (ref 3.5–5.1)
SODIUM: 141 meq/L (ref 135–145)

## 2016-12-23 LAB — LIPID PANEL
CHOLESTEROL: 235 mg/dL — AB (ref 0–200)
HDL: 52.5 mg/dL (ref >39.00)
LDL CALC: 156 mg/dL — AB (ref 0–99)
NonHDL: 182.56
Total CHOL/HDL Ratio: 4
Triglycerides: 135 mg/dL (ref 0.0–149.0)
VLDL: 27 mg/dL (ref 0.0–40.0)

## 2016-12-23 LAB — TSH: TSH: 1.5 u[IU]/mL (ref 0.35–4.50)

## 2016-12-23 LAB — HEPATIC FUNCTION PANEL
ALBUMIN: 4.3 g/dL (ref 3.5–5.2)
ALT: 16 U/L (ref 0–35)
AST: 17 U/L (ref 0–37)
Alkaline Phosphatase: 62 U/L (ref 39–117)
Bilirubin, Direct: 0 mg/dL (ref 0.0–0.3)
Total Bilirubin: 0.5 mg/dL (ref 0.2–1.2)
Total Protein: 7.4 g/dL (ref 6.0–8.3)

## 2016-12-28 ENCOUNTER — Ambulatory Visit (INDEPENDENT_AMBULATORY_CARE_PROVIDER_SITE_OTHER): Payer: BLUE CROSS/BLUE SHIELD | Admitting: Internal Medicine

## 2016-12-28 ENCOUNTER — Encounter: Payer: Self-pay | Admitting: Internal Medicine

## 2016-12-28 VITALS — BP 118/64 | HR 71 | Temp 97.4°F | Ht 63.0 in | Wt 174.0 lb

## 2016-12-28 DIAGNOSIS — Z0000 Encounter for general adult medical examination without abnormal findings: Secondary | ICD-10-CM | POA: Diagnosis not present

## 2016-12-28 NOTE — Progress Notes (Signed)
Pre visit review using our clinic review tool, if applicable. No additional management support is needed unless otherwise documented below in the visit note. 

## 2016-12-28 NOTE — Progress Notes (Signed)
Subjective:    Patient ID: Dawn Cole, female    DOB: 1966-10-22, 51 y.o.   MRN: [id number]  HPI  51 year old patient who is seen today for a preventive health examination. She had a mammogram in October.  She is scheduled for GYN follow-up next month as well as a follow-up colonoscopy.  She has a family history of colon cancer. No concerns or complaints.  She does have a history of essential hypertension which has been controlled.  Past Medical History:  Diagnosis Date  . HYPERLIPIDEMIA 10/09/2007  . HYPERTENSION 01/09/2009     Social History   Social History  . Marital status: Married    Spouse name: N/A  . Number of children: N/A  . Years of education: N/A   Occupational History  . Not on file.   Social History Main Topics  . Smoking status: Never Smoker  . Smokeless tobacco: Not on file  . Alcohol use No  . Drug use: No  . Sexual activity: Not on file   Other Topics Concern  . Not on file   Social History Narrative  . No narrative on file    Past Surgical History:  Procedure Laterality Date  . ABDOMINAL HYSTERECTOMY      No family history on file.  No Known Allergies  Current Outpatient Prescriptions on File Prior to Visit  Medication Sig Dispense Refill  . amLODipine-benazepril (LOTREL) 5-10 MG capsule TAKE 1 BY MOUTH DAILY 90 capsule 1  . calcium carbonate (OS-CAL) 600 MG TABS tablet Take 600 mg by mouth daily.    Marland Kitchen estradiol (VIVELLE-DOT) 0.075 MG/24HR Place 1 patch onto the skin 2 (two) times a week.      . Multiple Vitamin (MULTIVITAMIN) tablet Take 1 tablet by mouth daily.      Marland Kitchen tolterodine (DETROL LA) 2 MG 24 hr capsule Take 2 mg by mouth daily.      No current facility-administered medications on file prior to visit.     BP 118/64 (BP Location: Right Arm, Patient Position: Sitting, Cuff Size: Normal)   Pulse 71   Temp 97.4 F (36.3 C) (Oral)   Ht 5\' 3"  (1.6 m)   Wt 174 lb (78.9 kg)   SpO2 96%   BMI 30.82 kg/m     Review of  Systems  Constitutional: Negative.   HENT: Negative for congestion, dental problem, hearing loss, rhinorrhea, sinus pressure, sore throat and tinnitus.   Eyes: Negative for pain, discharge and visual disturbance.  Respiratory: Negative for cough and shortness of breath.   Cardiovascular: Negative for chest pain, palpitations and leg swelling.  Gastrointestinal: Negative for abdominal distention, abdominal pain, blood in stool, constipation, diarrhea, nausea and vomiting.  Genitourinary: Negative for difficulty urinating, dysuria, flank pain, frequency, hematuria, pelvic pain, urgency, vaginal bleeding, vaginal discharge and vaginal pain.  Musculoskeletal: Negative for arthralgias, gait problem and joint swelling.  Skin: Negative for rash.  Neurological: Negative for dizziness, syncope, speech difficulty, weakness, numbness and headaches.  Hematological: Negative for adenopathy.  Psychiatric/Behavioral: Negative for agitation, behavioral problems and dysphoric mood. The patient is not nervous/anxious.        Objective:   Physical Exam  Constitutional: She is oriented to person, place, and time. She appears well-developed and well-nourished.  HENT:  Head: Normocephalic.  Right Ear: External ear normal.  Left Ear: External ear normal.  Mouth/Throat: Oropharynx is clear and moist.  Eyes: Conjunctivae and EOM are normal. Pupils are equal, round, and reactive to light.  Neck: Normal  range of motion. Neck supple. No thyromegaly present.  Cardiovascular: Normal rate, regular rhythm, normal heart sounds and intact distal pulses.   Pulmonary/Chest: Effort normal and breath sounds normal.  Abdominal: Soft. Bowel sounds are normal. She exhibits no mass. There is no tenderness.  Musculoskeletal: Normal range of motion.  Lymphadenopathy:    She has no cervical adenopathy.  Neurological: She is alert and oriented to person, place, and time.  Skin: Skin is warm and dry. No rash noted.  Psychiatric:  She has a normal mood and affect. Her behavior is normal.          Assessment & Plan:   Preventive health exam.  Follow GYN.  Follow-up colonoscopy Essential hypertension, stable.  No change in medical regimen.  Continue home bod pressure monitoring  Ripley Lovecchio Pilar Plate

## 2016-12-28 NOTE — Patient Instructions (Addendum)
Limit your sodium (Salt) intake    It is important that you exercise regularly, at least 20 minutes 3 to 4 times per week.  If you develop chest pain or shortness of breath seek  medical attention.  Take a calcium supplement, plus [phone] units of vitamin D  Please check your blood pressure on a regular basis.  If it is consistently greater than 150/90, please make an office appointment.    Health Maintenance for Postmenopausal Women Introduction Menopause is a normal process in which your reproductive ability comes to an end. This process happens gradually over a span of months to years, usually between the ages of 70 and 65. Menopause is complete when you have missed 12 consecutive menstrual periods. It is important to talk with your health care provider about some of the most common conditions that affect postmenopausal women, such as heart disease, cancer, and bone loss (osteoporosis). Adopting a healthy lifestyle and getting preventive care can help to promote your health and wellness. Those actions can also lower your chances of developing some of these common conditions. What should I know about menopause? During menopause, you may experience a number of symptoms, such as:  Moderate-to-severe hot flashes.  Night sweats.  Decrease in sex drive.  Mood swings.  Headaches.  Tiredness.  Irritability.  Memory problems.  Insomnia. Choosing to treat or not to treat menopausal changes is an individual decision that you make with your health care provider. What should I know about hormone replacement therapy and supplements? Hormone therapy products are effective for treating symptoms that are associated with menopause, such as hot flashes and night sweats. Hormone replacement carries certain risks, especially as you become older. If you are thinking about using estrogen or estrogen with progestin treatments, discuss the benefits and risks with your health care provider. What  should I know about heart disease and stroke? Heart disease, heart attack, and stroke become more likely as you age. This may be due, in part, to the hormonal changes that your body experiences during menopause. These can affect how your body processes dietary fats, triglycerides, and cholesterol. Heart attack and stroke are both medical emergencies. There are many things that you can do to help prevent heart disease and stroke:  Have your blood pressure checked at least every 1-2 years. High blood pressure causes heart disease and increases the risk of stroke.  If you are 39-46 years old, ask your health care provider if you should take aspirin to prevent a heart attack or a stroke.  Do not use any tobacco products, including cigarettes, chewing tobacco, or electronic cigarettes. If you need help quitting, ask your health care provider.  It is important to eat a healthy diet and maintain a healthy weight.  Be sure to include plenty of vegetables, fruits, low-fat dairy products, and lean protein.  Avoid eating foods that are high in solid fats, added sugars, or salt (sodium).  Get regular exercise. This is one of the most important things that you can do for your health.  Try to exercise for at least 150 minutes each week. The type of exercise that you do should increase your heart rate and make you sweat. This is known as moderate-intensity exercise.  Try to do strengthening exercises at least twice each week. Do these in addition to the moderate-intensity exercise.  Know your numbers.Ask your health care provider to check your cholesterol and your blood glucose. Continue to have your blood tested as directed by your health care  provider. What should I know about cancer screening? There are several types of cancer. Take the following steps to reduce your risk and to catch any cancer development as early as possible. Breast Cancer  Practice breast self-awareness.  This means  understanding how your breasts normally appear and feel.  It also means doing regular breast self-exams. Let your health care provider know about any changes, no matter how small.  If you are 52 or older, have a clinician do a breast exam (clinical breast exam or CBE) every year. Depending on your age, family history, and medical history, it may be recommended that you also have a yearly breast X-ray (mammogram).  If you have a family history of breast cancer, talk with your health care provider about genetic screening.  If you are at high risk for breast cancer, talk with your health care provider about having an MRI and a mammogram every year.  Breast cancer (BRCA) gene test is recommended for women who have family members with BRCA-related cancers. Results of the assessment will determine the need for genetic counseling and BRCA1 and for BRCA2 testing. BRCA-related cancers include these types:  Breast. This occurs in males or females.  Ovarian.  Tubal. This may also be called fallopian tube cancer.  Cancer of the abdominal or pelvic lining (peritoneal cancer).  Prostate.  Pancreatic. Cervical, Uterine, and Ovarian Cancer  Your health care provider may recommend that you be screened regularly for cancer of the pelvic organs. These include your ovaries, uterus, and vagina. This screening involves a pelvic exam, which includes checking for microscopic changes to the surface of your cervix (Pap test).  For women ages 21-65, health care providers may recommend a pelvic exam and a Pap test every three years. For women ages 37-65, they may recommend the Pap test and pelvic exam, combined with testing for human papilloma virus (HPV), every five years. Some types of HPV increase your risk of cervical cancer. Testing for HPV may also be done on women of any age who have unclear Pap test results.  Other health care providers may not recommend any screening for nonpregnant women who are considered  low risk for pelvic cancer and have no symptoms. Ask your health care provider if a screening pelvic exam is right for you.  If you have had past treatment for cervical cancer or a condition that could lead to cancer, you need Pap tests and screening for cancer for at least 20 years after your treatment. If Pap tests have been discontinued for you, your risk factors (such as having a new sexual partner) need to be reassessed to determine if you should start having screenings again. Some women have medical problems that increase the chance of getting cervical cancer. In these cases, your health care provider may recommend that you have screening and Pap tests more often.  If you have a family history of uterine cancer or ovarian cancer, talk with your health care provider about genetic screening.  If you have vaginal bleeding after reaching menopause, tell your health care provider.  There are currently no reliable tests available to screen for ovarian cancer. Lung Cancer  Lung cancer screening is recommended for adults 37-68 years old who are at high risk for lung cancer because of a history of smoking. A yearly low-dose CT scan of the lungs is recommended if you:  Currently smoke.  Have a history of at least 30 pack-years of smoking and you currently smoke or have quit within the  past 15 years. A pack-year is smoking an average of one pack of cigarettes per day for one year. Yearly screening should:  Continue until it has been 15 years since you quit.  Stop if you develop a health problem that would prevent you from having lung cancer treatment. Colorectal Cancer  This type of cancer can be detected and can often be prevented.  Routine colorectal cancer screening usually begins at age 46 and continues through age 70.  If you have risk factors for colon cancer, your health care provider may recommend that you be screened at an earlier age.  If you have a family history of colorectal  cancer, talk with your health care provider about genetic screening.  Your health care provider may also recommend using home test kits to check for hidden blood in your stool.  A small camera at the end of a tube can be used to examine your colon directly (sigmoidoscopy or colonoscopy). This is done to check for the earliest forms of colorectal cancer.  Direct examination of the colon should be repeated every 5-10 years until age 30. However, if early forms of precancerous polyps or small growths are found or if you have a family history or genetic risk for colorectal cancer, you may need to be screened more often. Skin Cancer  Check your skin from head to toe regularly.  Monitor any moles. Be sure to tell your health care provider:  About any new moles or changes in moles, especially if there is a change in a mole's shape or color.  If you have a mole that is larger than the size of a pencil eraser.  If any of your family members has a history of skin cancer, especially at a young age, talk with your health care provider about genetic screening.  Always use sunscreen. Apply sunscreen liberally and repeatedly throughout the day.  Whenever you are outside, protect yourself by wearing long sleeves, pants, a wide-brimmed hat, and sunglasses. What should I know about osteoporosis? Osteoporosis is a condition in which bone destruction happens more quickly than new bone creation. After menopause, you may be at an increased risk for osteoporosis. To help prevent osteoporosis or the bone fractures that can happen because of osteoporosis, the following is recommended:  If you are 97-72 years old, get at least 1,000 mg of calcium and at least 600 mg of vitamin D per day.  If you are older than age 57 but younger than age 28, get at least 1,200 mg of calcium and at least 600 mg of vitamin D per day.  If you are older than age 11, get at least 1,200 mg of calcium and at least 800 mg of vitamin D  per day. Smoking and excessive alcohol intake increase the risk of osteoporosis. Eat foods that are rich in calcium and vitamin D, and do weight-bearing exercises several times each week as directed by your health care provider. What should I know about how menopause affects my mental health? Depression may occur at any age, but it is more common as you become older. Common symptoms of depression include:  Low or sad mood.  Changes in sleep patterns.  Changes in appetite or eating patterns.  Feeling an overall lack of motivation or enjoyment of activities that you previously enjoyed.  Frequent crying spells. Talk with your health care provider if you think that you are experiencing depression. What should I know about immunizations? It is important that you get and maintain  your immunizations. These include:  Tetanus, diphtheria, and pertussis (Tdap) booster vaccine.  Influenza every year before the flu season begins.  Pneumonia vaccine.  Shingles vaccine. Your health care provider may also recommend other immunizations. This information is not intended to replace advice given to you by your health care provider. Make sure you discuss any questions you have with your health care provider. Document Released: 01/28/2006 Document Revised: 06/25/2016 Document Reviewed: 09/09/2015  2017 [employer]vier [employer]

## 2017-02-03 DIAGNOSIS — N951 Menopausal and female climacteric states: Secondary | ICD-10-CM | POA: Diagnosis not present

## 2017-02-03 DIAGNOSIS — Z79890 Hormone replacement therapy (postmenopausal): Secondary | ICD-10-CM | POA: Diagnosis not present

## 2017-02-03 DIAGNOSIS — Z01419 Encounter for gynecological examination (general) (routine) without abnormal findings: Secondary | ICD-10-CM | POA: Diagnosis not present

## 2017-02-03 DIAGNOSIS — Z6831 Body mass index (BMI) 31.0-31.9, adult: Secondary | ICD-10-CM | POA: Diagnosis not present

## 2017-02-17 HISTORY — PX: COLONOSCOPY: SHX174

## 2017-03-07 DIAGNOSIS — Z800 Family history of malignant neoplasm of digestive organs: Secondary | ICD-10-CM | POA: Diagnosis not present

## 2017-03-07 DIAGNOSIS — Z1211 Encounter for screening for malignant neoplasm of colon: Secondary | ICD-10-CM | POA: Diagnosis not present

## 2017-03-07 DIAGNOSIS — Z85038 Personal history of other malignant neoplasm of large intestine: Secondary | ICD-10-CM | POA: Diagnosis not present

## 2017-03-14 DIAGNOSIS — H35033 Hypertensive retinopathy, bilateral: Secondary | ICD-10-CM | POA: Diagnosis not present

## 2017-03-16 DIAGNOSIS — Z6831 Body mass index (BMI) 31.0-31.9, adult: Secondary | ICD-10-CM | POA: Diagnosis not present

## 2017-03-16 DIAGNOSIS — E669 Obesity, unspecified: Secondary | ICD-10-CM | POA: Diagnosis not present

## 2017-03-17 DIAGNOSIS — Z0000 Encounter for general adult medical examination without abnormal findings: Secondary | ICD-10-CM | POA: Diagnosis not present

## 2017-03-22 ENCOUNTER — Telehealth: Payer: Self-pay | Admitting: Internal Medicine

## 2017-03-22 NOTE — Telephone Encounter (Signed)
° ° °  Pt request refill of the following:  amLODipine-benazepril (LOTREL) 5-10 MG capsule   Phamacy:   [facility]  Mail order     Pt said she only has a few left and ask if a 30 day supply can be called in to her local [facility]

## 2017-03-24 MED ORDER — AMLODIPINE BESY-BENAZEPRIL HCL 5-10 MG PO CAPS: ORAL_CAPSULE | ORAL | 2 refills | Status: DC

## 2017-03-24 MED ORDER — AMLODIPINE BESY-BENAZEPRIL HCL 5-10 MG PO CAPS: ORAL_CAPSULE | ORAL | 0 refills | Status: DC

## 2017-03-24 NOTE — Telephone Encounter (Signed)
Medication refills were sent as requested by pt.

## 2017-06-23 DIAGNOSIS — D1801 Hemangioma of skin and subcutaneous tissue: Secondary | ICD-10-CM | POA: Diagnosis not present

## 2017-06-23 DIAGNOSIS — L814 Other melanin hyperpigmentation: Secondary | ICD-10-CM | POA: Diagnosis not present

## 2017-06-23 DIAGNOSIS — C4372 Malignant melanoma of left lower limb, including hip: Secondary | ICD-10-CM | POA: Diagnosis not present

## 2017-06-23 DIAGNOSIS — D485 Neoplasm of uncertain behavior of skin: Secondary | ICD-10-CM | POA: Diagnosis not present

## 2017-06-23 DIAGNOSIS — L821 Other seborrheic keratosis: Secondary | ICD-10-CM | POA: Diagnosis not present

## 2017-06-23 DIAGNOSIS — Z85828 Personal history of other malignant neoplasm of skin: Secondary | ICD-10-CM | POA: Diagnosis not present

## 2017-07-12 ENCOUNTER — Other Ambulatory Visit: Payer: Self-pay | Admitting: General Surgery

## 2017-07-12 DIAGNOSIS — C4372 Malignant melanoma of left lower limb, including hip: Secondary | ICD-10-CM

## 2017-07-21 ENCOUNTER — Other Ambulatory Visit (HOSPITAL_COMMUNITY): Payer: Self-pay

## 2017-07-21 NOTE — Pre-Procedure Instructions (Signed)
Dawn Cole  07/21/2017      [facility] - [place], [place] [address] [place] [address] Phone: [phone] Fax: [phone]  [facility] - [place], [place] [address] Phone: [phone] Fax: [phone] [place], [address] [place] [address] Phone: [phone] Fax: [phone]    Your procedure is scheduled on August 02, 2017  Report to [facility] Admitting Entrance "A" at 6:30 A.M.  Call this number if you have problems the morning of surgery:  [phone]   Remember:  Do not eat food or drink liquids after midnight on August 01, 2017  Take these medicines the morning of surgery with A SIP OF WATER: Tolterodine (DETROL LA).  7 days before surgery stop taking all Aspirins, Vitamins, Fish oils, and Herbal medications. Also stop all NSAIDS i.e. Advil, Motrin, Aleve, Anaprox, Naproxen, BC and Goody Powders.   Do not wear jewelry, make-up or nail polish.  Do not wear lotions, powders, or perfumes, or deodorant.  Do not shave 48 hours prior to surgery.    Do not bring valuables to the hospital.  [facility] is not responsible for any belongings or valuables.  Contacts, dentures or bridgework may not be worn into surgery.  Leave your suitcase in the car.  After surgery it may be brought to your room.  For patients admitted to the hospital, discharge time will be determined by your treatment team.  Patients discharged the day of surgery will not be allowed to drive home.   Special instructions:  [facility]- Preparing For Surgery  Before surgery, you can play an important role. Because skin is not sterile, your skin needs to be as free of germs as possible. You can reduce the number of germs on your skin by washing with CHG (chlorahexidine gluconate)  Soap before surgery.  CHG is an antiseptic cleaner which kills germs and bonds with the skin to continue killing germs even after washing.  Please do not use if you have an allergy to CHG or antibacterial soaps. If your skin becomes reddened/irritated stop using the CHG.  Do not shave (including legs and underarms) for at least 48 hours prior to first CHG shower. It is OK to shave your face.  Please follow these instructions carefully.   1. Shower the NIGHT BEFORE SURGERY and the MORNING OF SURGERY with CHG.   2. If you chose to wash your hair, wash your hair first as usual with your normal shampoo.  3. After you shampoo, rinse your hair and body thoroughly to remove the shampoo.  4. Use CHG as you would any other liquid soap. You can apply CHG directly to the skin and wash gently with a scrungie or a clean washcloth.   5. Apply the CHG Soap to your body ONLY FROM THE NECK DOWN.  Do not use on open wounds or open sores. Avoid contact with your eyes, ears, mouth and genitals (private parts). Wash genitals (private parts) with your normal soap.  6. Wash thoroughly, paying special attention to the area where your surgery will be performed.  7. Thoroughly rinse your body with warm water from the neck down.  8. DO NOT shower/wash with your normal soap after using and rinsing off the CHG Soap.  9. Pat yourself dry with a CLEAN TOWEL.  10. Wear CLEAN PAJAMAS   11. Place CLEAN SHEETS on your bed the night of your first shower and DO NOT SLEEP WITH PETS.  Day of Surgery: Do not apply any deodorants/lotions. Please wear clean clothes to the hospital/surgery center.    Please read over the following fact sheets that you were given. Pain Booklet, Coughing and Deep Breathing and Surgical Site Infection Prevention

## 2017-07-22 ENCOUNTER — Encounter (HOSPITAL_COMMUNITY): Payer: Self-pay

## 2017-07-22 ENCOUNTER — Encounter (HOSPITAL_COMMUNITY)
Admission: RE | Admit: 2017-07-22 | Discharge: 2017-07-22 | Disposition: A | Discharge status: Home / Self Care | Payer: BLUE CROSS/BLUE SHIELD | Source: Ambulatory Visit | Attending: General Surgery | Admitting: General Surgery

## 2017-07-22 DIAGNOSIS — I10 Essential (primary) hypertension: Secondary | ICD-10-CM | POA: Insufficient documentation

## 2017-07-22 DIAGNOSIS — C4372 Malignant melanoma of left lower limb, including hip: Secondary | ICD-10-CM | POA: Insufficient documentation

## 2017-07-22 DIAGNOSIS — Z01812 Encounter for preprocedural laboratory examination: Secondary | ICD-10-CM | POA: Diagnosis not present

## 2017-07-22 HISTORY — DX: Malignant (primary) neoplasm, unspecified: C80.1

## 2017-07-22 LAB — COMPREHENSIVE METABOLIC PANEL
ALBUMIN: 3.8 g/dL (ref 3.5–5.0)
ALK PHOS: 66 U/L (ref 38–126)
ALT: 22 U/L (ref 14–54)
ANION GAP: 6 (ref 5–15)
AST: 23 U/L (ref 15–41)
BUN: 14 mg/dL (ref 6–20)
CALCIUM: 9.3 mg/dL (ref 8.9–10.3)
CO2: 27 mmol/L (ref 22–32)
Chloride: 106 mmol/L (ref 101–111)
Creatinine, Ser: 0.71 mg/dL (ref 0.44–1.00)
GFR calc Af Amer: >60 mL/min (ref >60)
GFR calc non Af Amer: >60 mL/min (ref >60)
GLUCOSE: 101 mg/dL — AB (ref 65–99)
POTASSIUM: 4.3 mmol/L (ref 3.5–5.1)
SODIUM: 139 mmol/L (ref 135–145)
Total Bilirubin: 0.7 mg/dL (ref 0.3–1.2)
Total Protein: 6.9 g/dL (ref 6.5–8.1)

## 2017-07-22 LAB — CBC WITH DIFFERENTIAL/PLATELET
BASOS ABS: 0 10*3/uL (ref 0.0–0.1)
Basophils Relative: 0 %
Eosinophils Absolute: 0.3 10*3/uL (ref 0.0–0.7)
Eosinophils Relative: 3 %
HEMATOCRIT: 41 % (ref 36.0–46.0)
Hemoglobin: 13.8 g/dL (ref 12.0–15.0)
LYMPHS PCT: 26 %
Lymphs Abs: 2.1 10*3/uL (ref 0.7–4.0)
MCH: 29.1 pg (ref 26.0–34.0)
MCHC: 33.7 g/dL (ref 30.0–36.0)
MCV: 86.5 fL (ref 78.0–100.0)
Monocytes Absolute: 0.6 10*3/uL (ref 0.1–1.0)
Monocytes Relative: 8 %
NEUTROS ABS: 5 10*3/uL (ref 1.7–7.7)
NEUTROS PCT: 63 %
Platelets: 245 10*3/uL (ref 150–400)
RBC: 4.74 MIL/uL (ref 3.87–5.11)
RDW: 12.3 % (ref 11.5–15.5)
WBC: 8 10*3/uL (ref 4.0–10.5)

## 2017-07-22 LAB — LACTATE DEHYDROGENASE: LDH: 142 U/L (ref 98–192)

## 2017-07-22 NOTE — Progress Notes (Signed)
PCP: Dr. Frankey Poot @ [facility]

## 2017-08-01 NOTE — H&P (Signed)
Dawn Cole 07/12/2017 1:37 PM Location: [facility] Patient #: [id number] DOB: [birth year] Married / Language: Dawn Cole [place] / Race: White Female   History of Present Illness Dawn Klein MD; 07/12/2017 2:45 PM) The patient is a 51 year old female who presents with malignant melanoma. Patient is referred for consultation by Dr. Elvera Cole for a new diagnosis of melanoma of the left calf. The patient thought this was a wart and went to her dermatologist to get it frozen off. However, he was concerned that it may represent a neoplasm rather than papilloma. Shave biopsy yielded a diagnosis of a 2.25 mm melanoma. Deep and peripheral margins were positive. There was no lymphovascular invasion, satellitosis, or ulceration present. The patient did have 4-5 mitoses per millimeter squared. She does not have any personal history of cancer. 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 Her brother had pancreatic cancer, her mother had breast cancer, and her maternal aunt had colon cancer.   Past Surgical History (Dawn Cole, CMA; 07/12/2017 1:43 PM) Hysterectomy (not due to cancer) - Partial   Diagnostic Studies History (Dawn Cole, CMA; 07/12/2017 1:43 PM) Colonoscopy  within last year Mammogram  within last year Pap Smear  1-5 years ago  Allergies (Dawn Cole, CMA; 07/12/2017 1:45 PM) No Known Drug Allergies 07/12/2017  Medication History (Dawn Cole, CMA; 07/12/2017 1:46 PM) Amlodipine Besy-Benazepril HCl (5-10MG  Capsule, Oral) Active. Estradiol (0.1MG /24HR Patch TW, Transdermal) Active. Tolterodine Tartrate ER (2MG  Capsule ER 24HR, Oral) Active. Medications Reconciled  Social History (Dawn Cole, CMA; 07/12/2017 1:43 PM) Caffeine use  Carbonated beverages, Tea. No alcohol use  No drug use  Tobacco use  Never smoker.  Family History (Dawn Cole, [place]; 07/12/2017 1:43 PM) Alcohol Abuse  Brother, Father. Colon Cancer   Mother. Hypertension  Mother. Malignant Neoplasm Of Pancreas  Brother, Mother.  Pregnancy / Birth History (Dawn Cole, [place]; 07/12/2017 1:43 PM) Age at menarche  50 years. Gravida  2 Irregular periods  Maternal age  66-25 Para  2  Other Problems (Dawn Cole, CMA; 07/12/2017 1:43 PM) High blood pressure  Hypercholesterolemia  Melanoma     Review of Systems (Dawn Cole CMA; 07/12/2017 1:44 PM) General Not Present- Appetite Loss, Chills, Fatigue, Fever, Night Sweats, Weight Gain and Weight Loss. Skin Present- Dryness. Not Present- Change in Wart/Mole, Hives, Jaundice, New Lesions, Non-Healing Wounds, Rash and Ulcer. HEENT Present- Wears glasses/contact lenses. Not Present- Earache, Hearing Loss, Hoarseness, Nose Bleed, Oral Ulcers, Ringing in the Ears, Seasonal Allergies, Sinus Pain, Sore Throat, Visual Disturbances and Yellow Eyes. Respiratory Not Present- Bloody sputum, Chronic Cough, Difficulty Breathing, Snoring and Wheezing. Breast Not Present- Breast Mass, Breast Pain, Nipple Discharge and Skin Changes. Cardiovascular Not Present- Chest Pain, Difficulty Breathing Lying Down, Leg Cramps, Palpitations, Rapid Heart Rate, Shortness of Breath and Swelling of Extremities. Gastrointestinal Not Present- Abdominal Pain, Bloating, Bloody Stool, Change in Bowel Habits, Chronic diarrhea, Constipation, Difficulty Swallowing, Excessive gas, Gets full quickly at meals, Hemorrhoids, Indigestion, Nausea, Rectal Pain and Vomiting. Female Genitourinary Not Present- Frequency, Nocturia, Painful Urination, Pelvic Pain and Urgency. Musculoskeletal Not Present- Back Pain, Joint Pain, Joint Stiffness, Muscle Pain, Muscle Weakness and Swelling of Extremities. Neurological Not Present- Decreased Memory, Fainting, Headaches, Numbness, Seizures, Tingling, Tremor, Trouble walking and Weakness. Psychiatric Not Present- Anxiety, Bipolar, Change in Sleep Pattern, Depression, Fearful and Frequent  crying. Endocrine Not Present- Cold Intolerance, Excessive Hunger, Hair Changes, Heat Intolerance, Hot flashes and New Diabetes. Hematology Not Present- Blood Thinners, Easy Bruising, Excessive bleeding, Gland problems,  HIV and Persistent Infections.  Vitals (Dawn Cole CMA; 07/12/2017 1:47 PM) 07/12/2017 1:46 PM Weight: 168.38 lb Height: 63in Body Surface Area: 1.8 m Body Mass Index: 29.83 kg/m  Temp.: 86F(Oral)  Pulse: 70 (Regular)  P.OX: 95% (Room air) BP: 110/74 (Sitting, Left Arm, Standard)       Physical Exam Dawn Klein MD; 07/12/2017 2:46 PM) General Mental Status-Alert. General Appearance-Consistent with stated age. Hydration-Well hydrated. Voice-Normal.  Integumentary Note: 1 cm scab at site of melanoma on posterolateral calf. no residual pigment seen. no popliteal adenopathy or inguinal adenopathy.   Head and Neck Head-normocephalic, atraumatic with no lesions or palpable masses. Trachea-midline. Thyroid Gland Characteristics - normal size and consistency.  Eye Eyeball - Bilateral-Extraocular movements intact. Sclera/Conjunctiva - Bilateral-No scleral icterus.  Chest and Lung Exam Chest and lung exam reveals -quiet, even and easy respiratory effort with no use of accessory muscles and on auscultation, normal breath sounds, no adventitious sounds and normal vocal resonance. Inspection Chest Wall - Normal. Back - normal.  Cardiovascular Cardiovascular examination reveals -normal heart sounds, regular rate and rhythm with no murmurs and normal pedal pulses bilaterally.  Abdomen Inspection Inspection of the abdomen reveals - No Hernias. Palpation/Percussion Palpation and Percussion of the abdomen reveal - Soft, Non Tender, No Rebound tenderness, No Rigidity (guarding) and No hepatosplenomegaly. Auscultation Auscultation of the abdomen reveals - Bowel sounds normal.  Neurologic Neurologic evaluation reveals -alert and  oriented x 3 with no impairment of recent or remote memory. Mental Status-Normal.  Musculoskeletal Global Assessment -Note: no gross deformities.  Normal Exam - Left-Upper Extremity Strength Normal and Lower Extremity Strength Normal. Normal Exam - Right-Upper Extremity Strength Normal and Lower Extremity Strength Normal.  Lymphatic Head & Neck  General Head & Neck Lymphatics: Bilateral - Description - Normal. Axillary  General Axillary Region: Bilateral - Description - Normal. Tenderness - Non Tender. Femoral & Inguinal  Generalized Femoral & Inguinal Lymphatics: Bilateral - Description - No Generalized lymphadenopathy.    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  She will need a wide local excision with advancement flap closure and sentinel lymph node mapping and biopsy. I have reviewed the process of surgery with the patient. I have discussed the sentinel lymph node injection. I discussed the location and length of incisions. I reviewed the risks of surgery which include bleeding, infection, numbness, swelling, wound breakdown, recurrent melanoma, possible need for additional surgery, possible blood clot. I discussed that the scar on the leg would be long. She understands and wishes to proceed. 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 Orders are written and posting sheet sent. Current Plans Schedule for Surgery Pt Education - Melanoma: melanoma   Signed by Dawn Klein, MD (07/12/2017 2:49 PM)

## 2017-08-02 ENCOUNTER — Encounter (HOSPITAL_COMMUNITY): Payer: Self-pay

## 2017-08-02 ENCOUNTER — Ambulatory Visit (HOSPITAL_COMMUNITY): Payer: BLUE CROSS/BLUE SHIELD | Admitting: Vascular Surgery

## 2017-08-02 ENCOUNTER — Ambulatory Visit (HOSPITAL_COMMUNITY)
Admission: RE | Admit: 2017-08-02 | Discharge: 2017-08-02 | Disposition: A | Discharge status: Home / Self Care | Payer: BLUE CROSS/BLUE SHIELD | Source: Ambulatory Visit | Attending: General Surgery | Admitting: General Surgery

## 2017-08-02 ENCOUNTER — Ambulatory Visit (HOSPITAL_COMMUNITY): Payer: BLUE CROSS/BLUE SHIELD | Admitting: Anesthesiology

## 2017-08-02 ENCOUNTER — Encounter (HOSPITAL_COMMUNITY)
Admission: RE | Disposition: A | Discharge status: Home / Self Care | Payer: Self-pay | Source: Ambulatory Visit | Attending: General Surgery

## 2017-08-02 DIAGNOSIS — D492 Neoplasm of unspecified behavior of bone, soft tissue, and skin: Secondary | ICD-10-CM | POA: Diagnosis not present

## 2017-08-02 DIAGNOSIS — C4372 Malignant melanoma of left lower limb, including hip: Secondary | ICD-10-CM | POA: Diagnosis not present

## 2017-08-02 DIAGNOSIS — I10 Essential (primary) hypertension: Secondary | ICD-10-CM | POA: Insufficient documentation

## 2017-08-02 DIAGNOSIS — Z79890 Hormone replacement therapy (postmenopausal): Secondary | ICD-10-CM | POA: Insufficient documentation

## 2017-08-02 DIAGNOSIS — Z79899 Other long term (current) drug therapy: Secondary | ICD-10-CM | POA: Insufficient documentation

## 2017-08-02 HISTORY — PX: LIPOMA EXCISION: SHX5283

## 2017-08-02 SURGERY — EXCISION LIPOMA
Anesthesia: General | Site: Leg Lower | Laterality: Left

## 2017-08-02 MED ORDER — MIDAZOLAM HCL 2 MG/2ML IJ SOLN
INTRAMUSCULAR | Status: AC
  Filled 2017-08-02: qty 2

## 2017-08-02 MED ORDER — FENTANYL CITRATE (PF) 100 MCG/2ML IJ SOLN
100.0000 ug | Freq: Once | INTRAMUSCULAR | Status: AC
  Administered 2017-08-02: 50 ug via INTRAVENOUS
  Filled 2017-08-02: qty 2

## 2017-08-02 MED ORDER — PROPOFOL 10 MG/ML IV BOLUS
INTRAVENOUS | Status: AC
  Filled 2017-08-02: qty 20

## 2017-08-02 MED ORDER — METHYLENE BLUE 0.5 % INJ SOLN
INTRAVENOUS | Status: AC
  Filled 2017-08-02: qty 10

## 2017-08-02 MED ORDER — OXYCODONE HCL 5 MG PO TABS: 5.0000 mg | ORAL_TABLET | Freq: Four times a day (QID) | ORAL | 0 refills | Status: DC | PRN

## 2017-08-02 MED ORDER — BUPIVACAINE-EPINEPHRINE (PF) 0.25% -1:200000 IJ SOLN
INTRAMUSCULAR | Status: DC | PRN
  Administered 2017-08-02: 25 mL

## 2017-08-02 MED ORDER — ONDANSETRON HCL 4 MG/2ML IJ SOLN
INTRAMUSCULAR | Status: DC | PRN
  Administered 2017-08-02: 4 mg via INTRAVENOUS

## 2017-08-02 MED ORDER — 0.9 % SODIUM CHLORIDE (POUR BTL) OPTIME
TOPICAL | Status: DC | PRN
  Administered 2017-08-02: 1000 mL

## 2017-08-02 MED ORDER — TECHNETIUM TC 99M SULFUR COLLOID FILTERED
1.0000 | Freq: Once | INTRAVENOUS | Status: AC | PRN
  Administered 2017-08-02: 1 via INTRADERMAL

## 2017-08-02 MED ORDER — HYDROMORPHONE HCL 1 MG/ML IJ SOLN: 0.2500 mg | INTRAMUSCULAR | Status: DC | PRN

## 2017-08-02 MED ORDER — PROPOFOL 10 MG/ML IV BOLUS
INTRAVENOUS | Status: DC | PRN
  Administered 2017-08-02: 160 mg via INTRAVENOUS

## 2017-08-02 MED ORDER — CHLORHEXIDINE GLUCONATE CLOTH 2 % EX PADS: 6.0000 | MEDICATED_PAD | Freq: Once | CUTANEOUS | Status: DC

## 2017-08-02 MED ORDER — BUPIVACAINE-EPINEPHRINE (PF) 0.25% -1:200000 IJ SOLN
INTRAMUSCULAR | Status: AC
  Filled 2017-08-02: qty 30

## 2017-08-02 MED ORDER — LIDOCAINE HCL (PF) 1 % IJ SOLN
INTRAMUSCULAR | Status: AC
  Filled 2017-08-02: qty 30

## 2017-08-02 MED ORDER — PROMETHAZINE HCL 25 MG/ML IJ SOLN: 6.2500 mg | INTRAMUSCULAR | Status: DC | PRN

## 2017-08-02 MED ORDER — DEXAMETHASONE SODIUM PHOSPHATE 10 MG/ML IJ SOLN
INTRAMUSCULAR | Status: DC | PRN
  Administered 2017-08-02: 10 mg via INTRAVENOUS

## 2017-08-02 MED ORDER — MIDAZOLAM HCL 5 MG/5ML IJ SOLN
INTRAMUSCULAR | Status: DC | PRN
  Administered 2017-08-02: 2 mg via INTRAVENOUS

## 2017-08-02 MED ORDER — CEFAZOLIN SODIUM-DEXTROSE 2-4 GM/100ML-% IV SOLN
2.0000 g | INTRAVENOUS | Status: AC
  Administered 2017-08-02: 2 g via INTRAVENOUS
  Filled 2017-08-02: qty 100

## 2017-08-02 MED ORDER — FENTANYL CITRATE (PF) 250 MCG/5ML IJ SOLN
INTRAMUSCULAR | Status: AC
  Filled 2017-08-02: qty 5

## 2017-08-02 MED ORDER — PHENYLEPHRINE 40 MCG/ML (10ML) SYRINGE FOR IV PUSH (FOR BLOOD PRESSURE SUPPORT)
PREFILLED_SYRINGE | INTRAVENOUS | Status: AC
  Filled 2017-08-02: qty 10

## 2017-08-02 MED ORDER — LACTATED RINGERS IV SOLN: INTRAVENOUS | Status: DC

## 2017-08-02 MED ORDER — LACTATED RINGERS IV SOLN
INTRAVENOUS | Status: DC
  Administered 2017-08-02 (×2): via INTRAVENOUS

## 2017-08-02 MED ORDER — FENTANYL CITRATE (PF) 100 MCG/2ML IJ SOLN
INTRAMUSCULAR | Status: DC | PRN
  Administered 2017-08-02 (×2): 50 ug via INTRAVENOUS

## 2017-08-02 MED ORDER — SUGAMMADEX SODIUM 200 MG/2ML IV SOLN
INTRAVENOUS | Status: DC | PRN
  Administered 2017-08-02: 100 mg via INTRAVENOUS

## 2017-08-02 MED ORDER — PHENYLEPHRINE 40 MCG/ML (10ML) SYRINGE FOR IV PUSH (FOR BLOOD PRESSURE SUPPORT)
PREFILLED_SYRINGE | INTRAVENOUS | Status: DC | PRN
  Administered 2017-08-02 (×5): 80 ug via INTRAVENOUS

## 2017-08-02 MED ORDER — LIDOCAINE 2% (20 MG/ML) 5 ML SYRINGE
INTRAMUSCULAR | Status: AC
  Filled 2017-08-02: qty 5

## 2017-08-02 MED ORDER — SCOPOLAMINE 1 MG/3DAYS TD PT72
MEDICATED_PATCH | TRANSDERMAL | Status: AC
  Filled 2017-08-02: qty 1

## 2017-08-02 MED ORDER — MIDAZOLAM HCL 2 MG/2ML IJ SOLN
2.0000 mg | Freq: Once | INTRAMUSCULAR | Status: DC
  Filled 2017-08-02: qty 2

## 2017-08-02 MED ORDER — SUGAMMADEX SODIUM 200 MG/2ML IV SOLN
INTRAVENOUS | Status: AC
  Filled 2017-08-02: qty 2

## 2017-08-02 MED ORDER — EPHEDRINE 5 MG/ML INJ
INTRAVENOUS | Status: AC
  Filled 2017-08-02: qty 10

## 2017-08-02 MED ORDER — METHYLENE BLUE 0.5 % INJ SOLN
INTRAVENOUS | Status: DC | PRN
  Administered 2017-08-02: 1 mL via SUBMUCOSAL

## 2017-08-02 MED ORDER — ONDANSETRON HCL 4 MG/2ML IJ SOLN
INTRAMUSCULAR | Status: AC
  Filled 2017-08-02: qty 2

## 2017-08-02 MED ORDER — ROCURONIUM BROMIDE 100 MG/10ML IV SOLN
INTRAVENOUS | Status: DC | PRN
  Administered 2017-08-02: 30 mg via INTRAVENOUS

## 2017-08-02 MED ORDER — LIDOCAINE 2% (20 MG/ML) 5 ML SYRINGE
INTRAMUSCULAR | Status: DC | PRN
  Administered 2017-08-02: 50 mg via INTRAVENOUS

## 2017-08-02 MED ORDER — SCOPOLAMINE 1 MG/3DAYS TD PT72
MEDICATED_PATCH | TRANSDERMAL | Status: DC | PRN
  Administered 2017-08-02: 1 via TRANSDERMAL

## 2017-08-02 MED ORDER — MEPERIDINE HCL 25 MG/ML IJ SOLN: 6.2500 mg | INTRAMUSCULAR | Status: DC | PRN

## 2017-08-02 MED ORDER — DEXAMETHASONE SODIUM PHOSPHATE 10 MG/ML IJ SOLN
INTRAMUSCULAR | Status: AC
  Filled 2017-08-02: qty 1

## 2017-08-02 MED ORDER — EPHEDRINE SULFATE-NACL 50-0.9 MG/10ML-% IV SOSY
PREFILLED_SYRINGE | INTRAVENOUS | Status: DC | PRN
  Administered 2017-08-02: 5 mg via INTRAVENOUS

## 2017-08-02 MED ORDER — DEXTROSE 5 % IV SOLN
INTRAVENOUS | Status: DC | PRN
  Administered 2017-08-02: 15 ug/min via INTRAVENOUS

## 2017-08-02 SURGICAL SUPPLY — 48 items
BENZOIN TINCTURE PRP APPL 2/3 (GAUZE/BANDAGES/DRESSINGS) IMPLANT
CANISTER SUCT 3000ML PPV (MISCELLANEOUS) ×2 IMPLANT
CHLORAPREP W/TINT 26ML (MISCELLANEOUS) ×2 IMPLANT
CLIP TI MEDIUM 24 (CLIP) ×4 IMPLANT
CONT SPEC 4OZ CLIKSEAL STRL BL (MISCELLANEOUS) IMPLANT
COVER SURGICAL LIGHT HANDLE (MISCELLANEOUS) ×2 IMPLANT
DERMABOND ADVANCED (GAUZE/BANDAGES/DRESSINGS) ×1
DERMABOND ADVANCED .7 DNX12 (GAUZE/BANDAGES/DRESSINGS) ×1 IMPLANT
DRAPE LAPAROSCOPIC ABDOMINAL (DRAPES) IMPLANT
DRAPE LAPAROTOMY 100X72 PEDS (DRAPES) ×2 IMPLANT
DRAPE ORTHO SPLIT 77X108 STRL (DRAPES) ×2
DRAPE SURG ORHT 6 SPLT 77X108 (DRAPES) ×2 IMPLANT
DRAPE UTILITY XL STRL (DRAPES) ×4 IMPLANT
DRSG TEGADERM 4X4.75 (GAUZE/BANDAGES/DRESSINGS) IMPLANT
ELECT CAUTERY BLADE 6.4 (BLADE) ×2 IMPLANT
ELECT REM PT RETURN 9FT ADLT (ELECTROSURGICAL) ×2
ELECTRODE REM PT RTRN 9FT ADLT (ELECTROSURGICAL) ×1 IMPLANT
GAUZE SPONGE 4X4 12PLY STRL (GAUZE/BANDAGES/DRESSINGS) IMPLANT
GAUZE SPONGE 4X4 16PLY XRAY LF (GAUZE/BANDAGES/DRESSINGS) ×2 IMPLANT
GLOVE BIO SURGEON STRL SZ 6 (GLOVE) ×2 IMPLANT
GLOVE BIOGEL PI IND STRL 6.5 (GLOVE) ×1 IMPLANT
GLOVE BIOGEL PI INDICATOR 6.5 (GLOVE) ×1
GOWN STRL REUS W/ TWL LRG LVL3 (GOWN DISPOSABLE) ×2 IMPLANT
GOWN STRL REUS W/TWL 2XL LVL3 (GOWN DISPOSABLE) ×4 IMPLANT
GOWN STRL REUS W/TWL LRG LVL3 (GOWN DISPOSABLE) ×2
KIT BASIN OR (CUSTOM PROCEDURE TRAY) ×2 IMPLANT
KIT ROOM TURNOVER OR (KITS) ×2 IMPLANT
NEEDLE HYPO 25GX1X1/2 BEV (NEEDLE) ×2 IMPLANT
NS IRRIG 1000ML POUR BTL (IV SOLUTION) ×2 IMPLANT
PACK SURGICAL SETUP 50X90 (CUSTOM PROCEDURE TRAY) ×2 IMPLANT
PAD ARMBOARD 7.5X6 YLW CONV (MISCELLANEOUS) ×4 IMPLANT
PENCIL BUTTON HOLSTER BLD 10FT (ELECTRODE) ×2 IMPLANT
SPECIMEN JAR SMALL (MISCELLANEOUS) ×2 IMPLANT
STRIP CLOSURE SKIN 1/2X4 (GAUZE/BANDAGES/DRESSINGS) IMPLANT
SUT ETHILON 2 0 FS 18 (SUTURE) ×4 IMPLANT
SUT MON AB 4-0 PC3 18 (SUTURE) ×2 IMPLANT
SUT SILK 2 0 FS (SUTURE) ×2 IMPLANT
SUT VIC AB 2-0 SH 27 (SUTURE) ×2
SUT VIC AB 2-0 SH 27XBRD (SUTURE) ×2 IMPLANT
SUT VIC AB 3-0 SH 27 (SUTURE) ×3
SUT VIC AB 3-0 SH 27X BRD (SUTURE) ×3 IMPLANT
SYR BULB 3OZ (MISCELLANEOUS) ×2 IMPLANT
SYR CONTROL 10ML LL (SYRINGE) ×4 IMPLANT
TOWEL OR 17X24 6PK STRL BLUE (TOWEL DISPOSABLE) ×2 IMPLANT
TOWEL OR 17X26 10 PK STRL BLUE (TOWEL DISPOSABLE) ×2 IMPLANT
TUBE CONNECTING 12X1/4 (SUCTIONS) IMPLANT
WATER STERILE IRR 1000ML POUR (IV SOLUTION) IMPLANT
YANKAUER SUCT BULB TIP NO VENT (SUCTIONS) IMPLANT

## 2017-08-02 NOTE — Anesthesia Procedure Notes (Signed)
Procedure Name: Intubation Date/Time: 08/02/2017 9:00 AM Performed by: Kyung Rudd Pre-anesthesia Checklist: Patient identified, Emergency Drugs available, Suction available and Patient being monitored Patient Re-evaluated:Patient Re-evaluated prior to induction Oxygen Delivery Method: Circle system utilized Preoxygenation: Pre-oxygenation with 100% oxygen Induction Type: IV induction Ventilation: Mask ventilation without difficulty Laryngoscope Size: Mac and 3 Grade View: Grade I Tube type: Oral Tube size: 7.0 mm Number of attempts: 1 Airway Equipment and Method: Stylet Placement Confirmation: ETT inserted through vocal cords under direct vision,  positive ETCO2 and breath sounds checked- equal and bilateral Secured at: 21 cm Tube secured with: Tape Dental Injury: Teeth and Oropharynx as per pre-operative assessment

## 2017-08-02 NOTE — Interval H&P Note (Signed)
History and Physical Interval Note:  08/02/2017 8:28 AM  Dawn Cole  has presented today for surgery, with the diagnosis of MELANOMA OF LEFT POSTERIOR CALF  The various methods of treatment have been discussed with the patient and family. After consideration of risks, benefits and other options for treatment, the patient has consented to  Procedure(s) with comments: [place] (Left) - GENERAL AND LOCAL as a surgical intervention .  The patient's history has been reviewed, patient examined, no change in status, stable for surgery.  I have reviewed the patient's chart and labs.  Questions were answered to the patient's satisfaction.     [name]

## 2017-08-02 NOTE — Discharge Instructions (Addendum)
[place] Office Phone Number [phone]   POST OP INSTRUCTIONS  Always review your discharge instruction sheet given to you by the facility where your surgery was performed.  IF YOU HAVE DISABILITY OR FAMILY LEAVE FORMS, YOU MUST BRING THEM TO THE OFFICE FOR PROCESSING.  DO NOT GIVE THEM TO YOUR DOCTOR.  1. A prescription for pain medication may be given to you upon discharge.  Take your pain medication as prescribed, if needed.  If narcotic pain medicine is not needed, then you may take acetaminophen (Tylenol) or ibuprofen (Advil) as needed. 2. Take your usually prescribed medications unless otherwise directed 3. If you need a refill on your pain medication, please contact your pharmacy.  They will contact our office to request authorization.  Prescriptions will not be filled after 5pm or on week-ends. 4. You should eat very light the first 24 hours after surgery, such as soup, crackers, pudding, etc.  Resume your normal diet the day after surgery 5. It is common to experience some constipation if taking pain medication after surgery.  Increasing fluid intake and taking a stool softener will usually help or prevent this problem from occurring.  A mild laxative (Milk of Magnesia or Miralax) should be taken according to package directions if there are no bowel movements after 48 hours. 6. You may shower in 48 hours.  The surgical glue will flake off in 2-3 weeks.   7. ACTIVITIES:  No strenuous activity or heavy lifting for 1 week.   a. You may drive when you no longer are taking prescription pain medication, you can comfortably wear a seatbelt, and you can safely maneuver your car and apply brakes. b. RETURN TO WORK:  __________5-7 days_______________ Dennis Bast should see your doctor in the office for a follow-up appointment approximately three-four weeks after your surgery.    WHEN TO CALL YOUR DOCTOR: 1. Fever over 101.0 2. Nausea and/or vomiting. 3. Extreme swelling or  bruising. 4. Continued bleeding from incision. 5. Increased pain, redness, or drainage from the incision.  The clinic staff is available to answer your questions during regular business hours.  Please dont hesitate to call and ask to speak to one of the nurses for clinical concerns.  If you have a medical emergency, go to the nearest emergency room or call 911.  A surgeon from [facility] is always on call at the hospital.  For further questions, please visit [email or web address]

## 2017-08-02 NOTE — Anesthesia Preprocedure Evaluation (Addendum)
Anesthesia Evaluation  Patient identified by MRN, date of birth, ID band Patient awake    Reviewed: Allergy & Precautions, NPO status , Patient's Chart, lab work & pertinent test results  Airway Mallampati: I  TM Distance: >3 FB Neck ROM: Full    Dental  (+) Teeth Intact, Dental Advisory Given   Pulmonary neg pulmonary ROS,    Pulmonary exam normal        Cardiovascular hypertension, Pt. on medications  Rhythm:Regular Rate:Normal     Neuro/Psych negative neurological ROS  negative psych ROS   GI/Hepatic negative GI ROS, Neg liver ROS,   Endo/Other  negative endocrine ROS  Renal/GU negative Renal ROS     Musculoskeletal negative musculoskeletal ROS (+)   Abdominal   Peds  Hematology negative hematology ROS (+)   Anesthesia Other Findings   Reproductive/Obstetrics negative OB ROS                           Lab Results  Component Value Date   WBC 8.0 07/22/2017   HGB 13.8 07/22/2017   HCT 41.0 07/22/2017   MCV 86.5 07/22/2017   PLT 245 07/22/2017   Lab Results  Component Value Date   CREATININE 0.71 07/22/2017   BUN 14 07/22/2017   NA 139 07/22/2017   K 4.3 07/22/2017   CL 106 07/22/2017   CO2 27 07/22/2017    No results found for: INR, PROTIME  EKG: normal sinus rhythm.   Anesthesia Physical Anesthesia Plan  ASA: II  Anesthesia Plan: General   Post-op Pain Management:    Induction: Intravenous  PONV Risk Score and Plan: 4 or greater and Ondansetron, Dexamethasone, Midazolam, Scopolamine patch - Pre-op and Propofol infusion  Airway Management Planned: Oral ETT  Additional Equipment:   Intra-op Plan:   Post-operative Plan: Extubation in OR  Informed Consent: I have reviewed the patients History and Physical, chart, labs and discussed the procedure including the risks, benefits and alternatives for the proposed anesthesia with the patient or authorized  representative who has indicated his/her understanding and acceptance.   Dental advisory given  Plan Discussed with: CRNA and Anesthesiologist  Anesthesia Plan Comments:       Anesthesia Quick Evaluation

## 2017-08-02 NOTE — Transfer of Care (Signed)
Immediate Anesthesia Transfer of Care Note  Patient: SU DUMA  Procedure(s) Performed: Procedure(s) with comments: WIDE LOCAL EXCISION WITH ADVANCEMENT FLAP CLOSURE LEFT LOWER LEG AND SENTINEL LYMPH NODE MAPPING AND BIOPOSY (Left) - GENERAL AND LOCAL  Patient Location: PACU  Anesthesia Type:General  Level of Consciousness: awake, alert  and oriented  Airway & Oxygen Therapy: Patient Spontanous Breathing and Patient connected to nasal cannula oxygen  Post-op Assessment: Report given to RN, Post -op Vital signs reviewed and stable and Patient moving all extremities  Post vital signs: Reviewed and stable  Last Vitals:  Vitals:   08/02/17 0711 08/02/17 1101  BP: 120/69 124/66  Pulse: (!) 58 92  Resp: 18 13  Temp: 36.7 C 36.6 C  SpO2: 99% 96%    Last Pain:  Vitals:   08/02/17 1101  TempSrc:   PainSc: 0-No pain      Patients Stated Pain Goal: 7 (56/38/75 6433)  Complications: No apparent anesthesia complications

## 2017-08-02 NOTE — Anesthesia Postprocedure Evaluation (Signed)
Anesthesia Post Note  Patient: Dawn Cole  Procedure(s) Performed: Procedure(s) (LRB): WIDE LOCAL EXCISION WITH ADVANCEMENT FLAP CLOSURE LEFT LOWER LEG AND SENTINEL LYMPH NODE MAPPING AND BIOPOSY (Left)     Patient location during evaluation: PACU Anesthesia Type: General Level of consciousness: awake and alert Pain management: pain level controlled Vital Signs Assessment: post-procedure vital signs reviewed and stable Respiratory status: spontaneous breathing, nonlabored ventilation, respiratory function stable and patient connected to nasal cannula oxygen Cardiovascular status: blood pressure returned to baseline and stable Postop Assessment: no signs of nausea or vomiting Anesthetic complications: no    Last Vitals:  Vitals:   08/02/17 1131 08/02/17 1140  BP: 133/79 (!) 135/96  Pulse: 79 68  Resp: (!) 22 18  Temp:    SpO2: 97% 100%    Last Pain:  Vitals:   08/02/17 1130  TempSrc:   PainSc: 0-No pain                 Effie Berkshire

## 2017-08-02 NOTE — Op Note (Signed)
PRE-OPERATIVE DIAGNOSIS: pT3acN0 left calf melanoma  POST-OPERATIVE DIAGNOSIS:  Same  PROCEDURE:  Procedure(s): Wide local excision left calf melanoma 2 cm margins, advancement flap closure for defect 12 cm x 5 cm, left deep inguinal sentinel lymph node mapping and biopsy  SURGEON:  Surgeon(s): [name], MD  Assistant: Margie Billet, PA-C  ANESTHESIA:   local and general  DRAINS: none   LOCAL MEDICATIONS USED:  MARCAINE    and XYLOCAINE   SPECIMEN:  Source of Specimen:  Four left inguinal sentinel lymph nodes, wide local excision left calf  FINDINGS:  Grossly clear surgical margins, 4 nodes, all hot, one with blue channels.  Above and below inguinal ligament.  DISPOSITION OF SPECIMEN:  PATHOLOGY  COUNTS:  YES  PLAN OF CARE: Discharge to home after PACU  PATIENT DISPOSITION:  PACU - hemodynamically stable.    PROCEDURE:   Pt was identified in the holding area, taken to the OR, and placed supine on the OR table.  General anesthesia was induced.  Time out was performed according to the surgical safety checklist.  When all was correct, we continued.  The patient was placed into lateral position.  The patient's left leg was prepped and draped in sterile fashion.  One mL methylene blue was injected intradermally around the melanoma biopsy site. The melanoma was identified and 2 cm margins were marked out.  20 mL local was administered under the melanoma and the adjacent tissue.  A #10 blade was used to incise the skin around the melanoma.  The cautery was used to take the dissection down to the fascia.  The skin was marked in situ with silk suture.  The cautery was used to take the specimen off the fascia, and it was passed off the table.    Penetrating towel clips were used to elevate the edges of the incision and the skin was freed up in all directions.  The skin was pulled together and held with penetrating towel clips. Deep interrupted 2-0 vicryl sutures were placed to relieve  tension.  The skin was then reapproximated with 3-0 interrupted vicryl deep dermal sutures and 4-0 monocryl running subcuticular sutures.  Three 2-0 nylon horizontal mattress sutures were placed as well.  The wound was dressed with Benzoin, steristrips, gauze, and webril/Coban.      The patient was placed back supine.  The groin was clipped, prepped, and draped.  The point of maximum signal intensity was identified with the neoprobe.  A 3 cm incision was made with a #15 blade.  The subcutaneous tissues were divided with the cautery.  A Weitlaner retractor was used to assist with visualization.  The tonsil clamp was used to bluntly dissect the inguinal fat pad.  4 deep inguinal sentinel lymph nodes were identified as described above.  The lymphovascular channels were clipped with hemoclips.  The nodes were passed off as specimens.  Hemostasis was achieved with the cautery.  The incision was irrigated and closed with 3-0 Vicryl deep dermal interrupted sutures and 4-0 Monocryl running subcuticular suture.  This was cleaned, dried, and dressed with dermabond.  Counts were correct.  Needle, sponge, and instrument counts were correct.  The patient was awakened from anesthesia and taken to the PACU in stable condition.

## 2017-08-03 ENCOUNTER — Encounter (HOSPITAL_COMMUNITY): Payer: Self-pay | Admitting: General Surgery

## 2017-08-05 ENCOUNTER — Encounter: Payer: Self-pay | Admitting: Genetics

## 2017-08-05 ENCOUNTER — Telehealth: Payer: Self-pay | Admitting: Genetics

## 2017-08-05 NOTE — Telephone Encounter (Signed)
Genetic counseling appt has been scheduled for the pt to see Vicente Males on 9/13 at 9am. Pt aware to arrive 15 minutes early. Letter mailed to the pt.

## 2017-08-09 NOTE — Progress Notes (Signed)
Please let patient know margins are negative on excision of melanoma and lymph nodes are negative.

## 2017-08-11 ENCOUNTER — Ambulatory Visit (HOSPITAL_COMMUNITY)
Admission: RE | Admit: 2017-08-11 | Discharge: 2017-08-11 | Disposition: A | Discharge status: Home / Self Care | Payer: BLUE CROSS/BLUE SHIELD | Source: Ambulatory Visit | Attending: General Surgery | Admitting: General Surgery

## 2017-08-11 ENCOUNTER — Other Ambulatory Visit (HOSPITAL_COMMUNITY): Payer: Self-pay | Admitting: General Surgery

## 2017-08-11 DIAGNOSIS — R609 Edema, unspecified: Secondary | ICD-10-CM

## 2017-08-11 DIAGNOSIS — M7989 Other specified soft tissue disorders: Secondary | ICD-10-CM | POA: Insufficient documentation

## 2017-08-11 NOTE — Progress Notes (Signed)
*  Preliminary Results* Left lower extremity venous duplex completed. Left lower extremity is negative for deep vein thrombosis. There is no evidence of left Baker's cyst.  Incidental findings: there is an anechoic area with internal echoes measuring 9.6cm x 3.5cm x 4.2cm. This is suggestive of a possible complex fluid collection versus hematoma versus seroma versus unknown etiology.  Preliminary results discussed with Armen of Dr. Marlowe Aschoff office.   08/11/2017 1:36 PM  Maudry Mayhew, BS, RVT, RDCS, RDMS

## 2017-08-31 ENCOUNTER — Telehealth: Payer: Self-pay | Admitting: Genetics

## 2017-08-31 NOTE — Telephone Encounter (Signed)
Informed patient that Dawn Cole another genetic counselor now has space on her schedule at 9am and can see her at the same time.  Patient plans to keep 9am apt on 09/01/2017.

## 2017-09-01 ENCOUNTER — Other Ambulatory Visit: Payer: BLUE CROSS/BLUE SHIELD

## 2017-09-01 ENCOUNTER — Encounter: Payer: BLUE CROSS/BLUE SHIELD | Admitting: Genetics

## 2017-09-01 ENCOUNTER — Encounter: Payer: Self-pay | Admitting: Genetic Counselor

## 2017-09-01 ENCOUNTER — Ambulatory Visit (HOSPITAL_BASED_OUTPATIENT_CLINIC_OR_DEPARTMENT_OTHER): Payer: BLUE CROSS/BLUE SHIELD | Admitting: Genetic Counselor

## 2017-09-01 DIAGNOSIS — Z803 Family history of malignant neoplasm of breast: Secondary | ICD-10-CM | POA: Diagnosis not present

## 2017-09-01 DIAGNOSIS — C439 Malignant melanoma of skin, unspecified: Secondary | ICD-10-CM | POA: Insufficient documentation

## 2017-09-01 DIAGNOSIS — Z800 Family history of malignant neoplasm of digestive organs: Secondary | ICD-10-CM | POA: Insufficient documentation

## 2017-09-01 DIAGNOSIS — C4372 Malignant melanoma of left lower limb, including hip: Secondary | ICD-10-CM

## 2017-09-01 DIAGNOSIS — Z8041 Family history of malignant neoplasm of ovary: Secondary | ICD-10-CM

## 2017-09-01 DIAGNOSIS — Z808 Family history of malignant neoplasm of other organs or systems: Secondary | ICD-10-CM

## 2017-09-01 NOTE — Progress Notes (Signed)
REFERRING PROVIDER: Marletta Lor, MD [place], [place] [address]  Harriett Sine, MD  PRIMARY PROVIDER:  Marletta Lor, MD  PRIMARY REASON FOR VISIT:  1. Family history of brain cancer   2. Family history of pancreatic cancer   3. Family history of colon cancer   4. Family history of ovarian cancer   5. Family history of melanoma   6. Malignant melanoma of left lower extremity including hip ([place])      HISTORY OF PRESENT ILLNESS:   Dawn Cole, a 51 y.o. female, was seen for a [facility] cancer genetics consultation at the request of Dr. Burnice Logan due to a personal and family history of cancer.  Dawn Cole presents to clinic today to discuss the possibility of a hereditary predisposition to cancer, genetic testing, and to further clarify her future cancer risks, as well as potential cancer risks for family members.   In July 2018, at the age of 65, Dawn Cole was diagnosed with Malignant Melanoma of the left calf. This was treated with surgery.  Her lymph nodes were clear.  Her surgeon referred her for genetic counseling and testing due to the personal and family history of cancer.      CANCER HISTORY:   No history exists.     HORMONAL RISK FACTORS:  Menarche was at age 47.  First live birth at age 79.  OCP use for approximately 15+ years.  Ovaries intact: yes.  Hysterectomy: yes.  Menopausal status: postmenopausal.  HRT use: 1-2 years. Colonoscopy: yes; normal. Mammogram within the last year: yes. Number of breast biopsies: 0. Up to date with pelvic exams:  yes. Any excessive radiation exposure in the past:  no  Past Medical History:  Diagnosis Date  . 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 Family history of brain cancer   . Family history of colon cancer   . Family history of melanoma   . Family history of ovarian cancer   . Family history of pancreatic cancer   . HYPERLIPIDEMIA 10/09/2007  . HYPERTENSION  01/09/2009  . Malignant melanoma ([place])     Past Surgical History:  Procedure Laterality Date  . ABDOMINAL HYSTERECTOMY    . COLONOSCOPY  12/2016  . HYSTEROSCOPY    . LIPOMA EXCISION Left 08/02/2017   Procedure: WIDE LOCAL EXCISION WITH ADVANCEMENT FLAP CLOSURE LEFT LOWER LEG AND SENTINEL LYMPH NODE MAPPING AND BIOPOSY;  Surgeon: [name], MD;  Location: [facility];  Service: General;  Laterality: Left;  GENERAL AND LOCAL    Social History   Social History  . Marital status: Married    Spouse name: N/A  . Number of children: N/A  . Years of education: N/A   Social History Main Topics  . Smoking status: Never Smoker  . Smokeless tobacco: Never Used  . Alcohol use No  . Drug use: No  . Sexual activity: Not Asked   Other Topics Concern  . None   Social History Narrative  . None     FAMILY HISTORY:  We obtained a detailed, 4-generation family history.  Significant diagnoses are listed below: Family History  Problem Relation Age of Onset  . Colon cancer Mother        dx in her late 68s  . Pancreatic cancer Mother 62  . Brain cancer Father 57  . Lung cancer Brother 39       heavy smoker  . Lung cancer Maternal Uncle   .  Pancreatic cancer Brother 20       maternal half brother - was exposed to agent orange  . Ovarian cancer Maternal Aunt        dx in her late 53s  . Lung cancer Maternal Aunt   . Melanoma Maternal Aunt   . Multiple myeloma Cousin        maternal first cousin  . Cancer Cousin        NOS    The patient has two daughters who are cancer free and has a granddaughter who is cancer free.  She has two full sisters, a full brother and maternal half brother.  Her sisters have never had cancer, but one of them had a daughter who is now 18 who had some type of cancer.  The full brother died of lung cancer as a result of being a heavy smoker.  Her half brother died of pancreatic cancer at age 74.  He had been in [place] and exposed to [employer].  Both parents  are deceased.  The patient's father died at 15 from a brain tumor.  The patient was 3 at the time and they have lost track of his family.  There is no information about the paternal side of the family or the type of brain tumor her father had.  The patient's mother had colon cancer in her late 36's and pancreatic cancer at 31.  She had five sisters and one brother and a paternal half sister and brother.  The half siblings did not die from cancer.  One brother and sister died of lung cancer, a sister died of ovarian cancer and another sister is currently being treated for melanoma.  Two sisters did not have cancer.  One of the patient's maternal cousin's was diagnosed with multiple myeloma.  Dawn Cole is unaware of previous family history of genetic testing for hereditary cancer risks. Patient's maternal ancestors are of Caucasian descent, and paternal ancestors are of Caucasian descent. There is no reported Ashkenazi Jewish ancestry. There is no known consanguinity.  GENETIC COUNSELING ASSESSMENT: Dawn Cole is a 51 y.o. female with a personal history of melanoma and family history of melanoma and brain, colon, pancreatic and ovarian cancer which is somewhat suggestive of a hereditary cancer syndrome and predisposition to cancer. We, therefore, discussed and recommended the following at today's visit.   DISCUSSION: We discussed that about 10% of Melanoma cases are hereditary, with most cases due to CDKN2A mutations.  Melanoma syndromes are either dominant (meaning that melanoma is the primary cancer seen in the syndrome) or subordinate (meaning that melanoma can be a part of the syndrome but is not a prominent cancer),  We reviewed that individuals with CDKN2A mutations are at a higher risk for not only melanoma, but also a brain cancer called Astrocytoma and pancreatic cancer. Based on the family history of colon, pancreatic and ovarian cancer we discussed Lynch syndrome risks as well.  The patient  reports having two colonoscopies that did not find polyps.  The patient reports that she thinks one of her maternal aunts may have had breast cancer, but she cannot remember which one. Based on that information we reviewed that BRCA mutations can be seen in families with breast, ovarian, and pancreatic cancer, and that melanoma is a subordinate cancer with those genes.  We reviewed the characteristics, features and inheritance patterns of hereditary cancer syndromes. We also discussed genetic testing, including the appropriate family members to test, the process of testing,  insurance coverage and turn-around-time for results. We discussed the implications of a negative, positive and/or variant of uncertain significant result. We recommended Dawn Cole pursue genetic testing for the [facility] panel. The Multi-Gene Panel offered by [facility] includes sequencing and/or deletion duplication testing of the following 80 genes: ALK, APC, ATM, AXIN2,BAP1,  BARD1, BLM, BMPR1A, BRCA1, BRCA2, BRIP1, CASR, CDC73, CDH1, CDK4, CDKN1B, CDKN1C, CDKN2A (p14ARF), CDKN2A (p16INK4a), CEBPA, CHEK2, CTNNA1, DICER1, DIS3L2, EGFR (c.2369C>T, p.Thr790Met variant only), EPCAM (Deletion/duplication testing only), FH, FLCN, GATA2, GPC3, GREM1 (Promoter region deletion/duplication testing only), HOXB13 (c.251G>A, p.Gly84Glu), HRAS, KIT, MAX, MEN1, MET, MITF (c.952G>A, p.Glu318Lys variant only), MLH1, MSH2, MSH3, MSH6, MUTYH, NBN, NF1, NF2, NTHL1, PALB2, PDGFRA, PHOX2B, PMS2, POLD1, POLE, POT1, PRKAR1A, PTCH1, PTEN, RAD50, RAD51C, RAD51D, RB1, RECQL4, RET, RUNX1, SDHAF2, SDHA (sequence changes only), SDHB, SDHC, SDHD, SMAD4, SMARCA4, SMARCB1, SMARCE1, STK11, SUFU, TERT, TERT, TMEM127, TP53, TSC1, TSC2, VHL, WRN and WT1.    Based on Dawn Cole's personal and family history of cancer, she meets medical criteria for genetic testing. Despite that she meets criteria, she may still have an out of pocket cost. We discussed that if her out of pocket  cost for testing is over $100, the laboratory will call and confirm whether she wants to proceed with testing.  If the out of pocket cost of testing is less than $100 she will be billed by the genetic testing laboratory.   PLAN: After considering the risks, benefits, and limitations, Dawn Cole  provided informed consent to pursue genetic testing and the blood sample was sent to [facility] for analysis of the multi-gene panel. Results should be available within approximately 2-3 weeks' time, at which point they will be disclosed by telephone to Dawn Cole, as will any additional recommendations warranted by these results. Dawn Cole will receive a summary of her genetic counseling visit and a copy of her results once available. This information will also be available in Epic. We encouraged Dawn Cole to remain in contact with cancer genetics annually so that we can continuously update the family history and inform her of any changes in cancer genetics and testing that may be of benefit for her family. Dawn Cole questions were answered to her satisfaction today. Our contact information was provided should additional questions or concerns arise.  Lastly, we encouraged Dawn Cole to remain in contact with cancer genetics annually so that we can continuously update the family history and inform her of any changes in cancer genetics and testing that may be of benefit for this family.   Ms.  Cole questions were answered to her satisfaction today. Our contact information was provided should additional questions or concerns arise. Thank you for the referral and allowing [place] to share in the care of your patient.   Dawn Cole P. Florene Glen, [place], [facility] Certified Genetic Counselor [email or web address] phone: [phone]  The patient was seen for a total of 50 minutes in face-to-face genetic counseling.  This patient was discussed with Drs. Magrinat, Lindi Adie and/or Burr Medico who agrees with the above.     _______________________________________________________________________ For Office Staff:  Number of people involved in session: 1 Was an Intern/ student involved with case: no

## 2017-09-02 ENCOUNTER — Encounter: Payer: Self-pay | Admitting: Genetic Counselor

## 2017-09-06 ENCOUNTER — Encounter: Payer: Self-pay | Admitting: Genetic Counselor

## 2017-09-08 ENCOUNTER — Encounter: Payer: Self-pay | Admitting: Internal Medicine

## 2017-09-16 ENCOUNTER — Other Ambulatory Visit: Payer: Self-pay | Admitting: Internal Medicine

## 2017-09-16 DIAGNOSIS — Z1231 Encounter for screening mammogram for malignant neoplasm of breast: Secondary | ICD-10-CM

## 2017-09-27 ENCOUNTER — Telehealth: Payer: Self-pay | Admitting: Genetic Counselor

## 2017-09-27 ENCOUNTER — Ambulatory Visit: Payer: Self-pay | Admitting: Genetic Counselor

## 2017-09-27 ENCOUNTER — Encounter: Payer: Self-pay | Admitting: Genetic Counselor

## 2017-09-27 DIAGNOSIS — Z808 Family history of malignant neoplasm of other organs or systems: Secondary | ICD-10-CM

## 2017-09-27 DIAGNOSIS — Z8041 Family history of malignant neoplasm of ovary: Secondary | ICD-10-CM

## 2017-09-27 DIAGNOSIS — Z1379 Encounter for other screening for genetic and chromosomal anomalies: Secondary | ICD-10-CM | POA: Insufficient documentation

## 2017-09-27 DIAGNOSIS — Z800 Family history of malignant neoplasm of digestive organs: Secondary | ICD-10-CM

## 2017-09-27 DIAGNOSIS — C4372 Malignant melanoma of left lower limb, including hip: Secondary | ICD-10-CM

## 2017-09-27 NOTE — Progress Notes (Signed)
HPI: Dawn Cole was previously seen in the [facility] due to a personal and family history of cancer and concerns regarding a hereditary predisposition to cancer. Please refer to our prior cancer genetics clinic note for more information regarding Dawn Cole's medical, social and family histories, and our assessment and recommendations, at the time. Dawn Cole recent genetic test results were disclosed to her, as were recommendations warranted by these results. These results and recommendations are discussed in more detail below.  CANCER HISTORY:    Malignant melanoma ([place])    Initial Diagnosis    Malignant melanoma ([place])     09/26/2017 Genetic Testing    Negative genetic testing on the Multi-gene cancer panel.  The Multi-Gene Panel offered by [facility] includes sequencing and/or deletion duplication testing of the following 83 genes: ALK, APC, ATM, AXIN2,BAP1,  BARD1, BLM, BMPR1A, BRCA1, BRCA2, BRIP1, CASR, CDC73, CDH1, CDK4, CDKN1B, CDKN1C, CDKN2A (p14ARF), CDKN2A (p16INK4a), CEBPA, CHEK2, CTNNA1, DICER1, DIS3L2, EGFR (c.2369C>T, p.Thr790Met variant only), EPCAM (Deletion/duplication testing only), FH, FLCN, GATA2, GPC3, GREM1 (Promoter region deletion/duplication testing only), HOXB13 (c.251G>A, p.Gly84Glu), HRAS, KIT, MAX, MEN1, MET, MITF (c.952G>A, p.Glu318Lys variant only), MLH1, MSH2, MSH3, MSH6, MUTYH, NBN, NF1, NF2, NTHL1, PALB2, PDGFRA, PHOX2B, PMS2, POLD1, POLE, POT1, PRKAR1A, PTCH1, PTEN, RAD50, RAD51C, RAD51D, RB1, RECQL4, RET, RUNX1, SDHAF2, SDHA (sequence changes only), SDHB, SDHC, SDHD, SMAD4, SMARCA4, SMARCB1, SMARCE1, STK11, SUFU, TERT, TERT, TMEM127, TP53, TSC1, TSC2, VHL, WRN and WT1.  The report date is September 26, 2017.        FAMILY HISTORY:  We obtained a detailed, 4-generation family history.  Significant diagnoses are listed below: Family History  Problem Relation Age of Onset  . Colon cancer Mother        dx in her late 57s, mets to pancrease,  liver and lungs  . Brain cancer Father 13  . Lung cancer Brother 84       heavy smoker  . Lung cancer Maternal Uncle   . Colon cancer Maternal Grandfather 46  . Pancreatic cancer Brother 52       maternal half brother - was exposed to agent orange  . Ovarian cancer Maternal Aunt        dx in her late 36s  . Lung cancer Maternal Aunt   . Melanoma Maternal Aunt   . Multiple myeloma Cousin        maternal first cousin  . Kidney cancer Cousin   . Heart failure Paternal Grandmother 83    The patient has two daughters who are cancer free and has a granddaughter who is cancer free.  She has two full sisters, a full brother and maternal half brother.  Her sisters have never had cancer, but one of them had a daughter who is now 80 who had some type of cancer.  The full brother died of lung cancer as a result of being a heavy smoker.  Her half brother died of pancreatic cancer at age 56.  [place] had been [place] and exposed to [employer].  Both parents are deceased.  The patient's father died at 24 from a brain tumor.  The patient was 3 at the time and they have lost track of his family.  There is no information about the paternal side of the family or the type of brain tumor her father had.  The patient's mother had colon cancer in her late 50's and pancreatic cancer at 51.  She had five sisters and one brother and  a paternal half sister and brother.  The half siblings did not die from cancer.  One brother and sister died of lung cancer, a sister died of ovarian cancer and another sister is currently being treated for melanoma.  Two sisters did not have cancer.  One of the patient's maternal cousin's was diagnosed with multiple myeloma.  Dawn Cole is unaware of previous family history of genetic testing for hereditary cancer risks. Patient's maternal ancestors are of Caucasian descent, and paternal ancestors are of Caucasian descent. There is no reported Ashkenazi Jewish ancestry. There is no  known consanguinity.  GENETIC TEST RESULTS: Genetic testing reported out on September 26, 2017 through the common hereditary cancer panel found no deleterious mutations.  The Hereditary Gene Panel offered by [facility] includes sequencing and/or deletion duplication testing of the following 46 genes: APC, ATM, AXIN2, BARD1, BMPR1A, BRCA1, BRCA2, BRIP1, CDH1, CDKN2A (p14ARF), CDKN2A (p16INK4a), CHEK2, CTNNA1, DICER1, EPCAM (Deletion/duplication testing only), GREM1 (promoter region deletion/duplication testing only), KIT, MEN1, MLH1, MSH2, MSH3, MSH6, MUTYH, NBN, NF1, NHTL1, PALB2, PDGFRA, PMS2, POLD1, POLE, PTEN, RAD50, RAD51C, RAD51D, SDHB, SDHC, SDHD, SMAD4, SMARCA4. STK11, TP53, TSC1, TSC2, and VHL.  The following genes were evaluated for sequence changes only: SDHA and HOXB13 c.251G>A variant only.  The test report has been scanned into EPIC and is located under the Molecular Pathology section of the Results Review tab.   We discussed with Dawn Cole that since the current genetic testing is not perfect, it is possible there may be a gene mutation in one of these genes that current testing cannot detect, but that chance is small. We also discussed, that it is possible that another gene that has not yet been discovered, or that we have not yet tested, is responsible for the cancer diagnoses in the family, and it is, therefore, important to remain in touch with cancer genetics in the future so that we can continue to offer Dawn Cole the most up to date genetic testing.     CANCER SCREENING RECOMMENDATIONS: This result is reassuring and indicates that Dawn Cole likely does not have an increased risk for a future cancer due to a mutation in one of these genes. This normal test also suggests that Dawn Cole's cancer was most likely not due to an inherited predisposition associated with one of these genes.  Most cancers happen by chance and this negative test suggests that her cancer falls into this category.   We, therefore, recommended she continue to follow the cancer management and screening guidelines provided by her oncology and primary healthcare provider.   RECOMMENDATIONS FOR FAMILY MEMBERS: Women in this family might be at some increased risk of developing cancer, over the general population risk, simply due to the family history of cancer. We recommended women in this family have a yearly mammogram beginning at age 62, or 35 years younger than the earliest onset of cancer, an annual clinical breast exam, and perform monthly breast self-exams. Women in this family should also have a gynecological exam as recommended by their primary provider. All family members should have a colonoscopy by age 89.  FOLLOW-UP: Lastly, we discussed with Dawn Cole that cancer genetics is a rapidly advancing field and it is possible that new genetic tests will be appropriate for her and/or her family members in the future. We encouraged her to remain in contact with cancer genetics on an annual basis so we can update her personal and family histories and let her know of advances in cancer genetics  that may benefit this family.   Our contact number was provided. Dawn Cole questions were answered to her satisfaction, and she knows she is welcome to call us at anytime with additional questions or concerns.   Roma Kayser, MS, [facility] Certified Genetic Counselor [email or web address]

## 2017-09-27 NOTE — Telephone Encounter (Signed)
Revealed negative genetic testing.  Discussed that we do not know why she has melanoma or why there is cancer in the family. It could be due to a different gene that we are not testing, or maybe our current technology may not be able to pick something up.  It will be important for her to keep in contact with genetics to keep up with whether additional testing may be needed.  

## 2017-09-27 NOTE — Telephone Encounter (Signed)
LM on VM with good news.  Asked that she CB. 

## 2017-09-30 DIAGNOSIS — Z23 Encounter for immunization: Secondary | ICD-10-CM | POA: Diagnosis not present

## 2017-10-13 ENCOUNTER — Ambulatory Visit
Admission: RE | Admit: 2017-10-13 | Discharge: 2017-10-13 | Disposition: A | Discharge status: Home / Self Care | Payer: BLUE CROSS/BLUE SHIELD | Source: Ambulatory Visit | Attending: Internal Medicine | Admitting: Internal Medicine

## 2017-10-13 DIAGNOSIS — Z1231 Encounter for screening mammogram for malignant neoplasm of breast: Secondary | ICD-10-CM

## 2017-10-24 ENCOUNTER — Encounter: Payer: Self-pay | Admitting: Physical Therapy

## 2017-10-24 ENCOUNTER — Ambulatory Visit: Payer: BLUE CROSS/BLUE SHIELD | Attending: General Surgery | Admitting: Physical Therapy

## 2017-10-24 DIAGNOSIS — M6281 Muscle weakness (generalized): Secondary | ICD-10-CM | POA: Diagnosis not present

## 2017-10-24 DIAGNOSIS — I890 Lymphedema, not elsewhere classified: Secondary | ICD-10-CM

## 2017-10-24 NOTE — Therapy (Signed)
[place], [place], [address] Phone: [phone]   Fax:  [phone]  Physical Therapy Evaluation  Patient Details  Name: Dawn Cole MRN: [id number] Date of Birth: 1966/12/15 Referring Provider: Barry Dienes   Encounter Date: 10/24/2017    Past Medical History:  Diagnosis Date  . 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 Family history of brain cancer   . Family history of colon cancer   . Family history of melanoma   . Family history of ovarian cancer   . Family history of pancreatic cancer   . HYPERLIPIDEMIA 10/09/2007  . HYPERTENSION 01/09/2009  . Malignant melanoma ([place])     Past Surgical History:  Procedure Laterality Date  . ABDOMINAL HYSTERECTOMY    . COLONOSCOPY  12/2016  . HYSTEROSCOPY      There were no vitals filed for this visit.   Subjective Assessment - 10/24/17 0813    Subjective  I have been having swelling since my surgery. Some days are worse than others. My foot bothers me the most because my skin gets so tight. I can't get my shoes on. I have this hard spot up near the scar. I want to get back to exercising like I was before. I am doing light exercises currently. I have noticed my swelling is better when I move around than just sitting.     Pertinent History  WLE/advanced flap closure and inguinal lymph SNLB 08/02/17 for malignant melanoma    Patient Stated Goals  to get my swelling down and get back to exercising         [facility] PT Assessment - 10/24/17 0001      Assessment   Medical Diagnosis  malignant melanoma    Referring Provider  Byerly    Onset Date/Surgical Date  08/02/17    Hand Dominance  Right    Prior Therapy  none      Precautions   Precautions  Other (comment) lymphedema LLE   lymphedema LLE     Restrictions   Weight Bearing Restrictions  No      Balance Screen   Has the patient fallen in the past 6 months  No    Has the patient had a  decrease in activity level because of a fear of falling?   No    Is the patient reluctant to leave their home because of a fear of falling?   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, industrial/product as well    Leisure  pt does 56 currently, prior to her surgery- was doing beach body, 80 day obsession was doing 1 hr workouts      Cognition   Overall Cognitive Status  Within Functional Limits for tasks assessed      Strength   Right Hip Flexion  5/5    Right Hip  ABduction  5/5    Right Hip ADduction  5/5    Left Hip Flexion  4/5    Left Hip ABduction  5/5    Left Hip ADduction  5/5    Right Knee Extension  4+/5    Left Knee Extension  5/5    Right Ankle Dorsiflexion  5/5    Left Ankle Dorsiflexion  4+/5        LYMPHEDEMA/ONCOLOGY QUESTIONNAIRE - 10/24/17 0828      Type   Cancer Type  malignant melanoma      Surgeries   Sentinel Lymph Node Biopsy Date  08/02/17 in groin   in groin   Other Surgery Date  08/02/17    Number Lymph Nodes Removed  4      Treatment   Active Chemotherapy Treatment  No    Past Chemotherapy Treatment  No    Active Radiation Treatment  No    Past Radiation Treatment  No      What other symptoms do you have   Are you Having Heaviness or Tightness  Yes    Are you having Pain  No    Are you having pitting edema  Yes    Body Site  LLE    Is it Hard or Difficult finding clothes that fit  No    Do you have infections  No      Lymphedema Assessments   Lymphedema Assessments  Lower extremities      Right  Lower Extremity Lymphedema   30 cm Proximal to Suprapatella  68 cm    20 cm Proximal to Suprapatella  59.3 cm    10 cm Proximal to Suprapatella  49.5 cm    At Midpatella/Popliteal Crease  38.5 cm    30 cm Proximal to Floor at Lateral Plantar Foot  40.5 cm    20 cm Proximal to Floor at Lateral Plantar Foot  31.5 1    10  cm Proximal to Floor at Lateral Malleoli  22.2 cm    5 cm Proximal to 1st MTP Joint  20.5 cm    Across MTP Joint  21 cm    Around Proximal Great Toe  7.3 cm      Left Lower Extremity Lymphedema   30 cm Proximal to Suprapatella  69 cm    20 cm Proximal to Suprapatella  62.2 cm    10 cm Proximal to Suprapatella  51.5 cm    At Midpatella/Popliteal Crease  41.2 cm    30 cm Proximal to Floor at Lateral Plantar Foot  42.5 cm    20 cm Proximal to Floor at Lateral Plantar Foot  32.5 cm    10 cm Proximal to Floor at Lateral Malleoli  24 cm    5 cm Proximal to 1st MTP Joint  20.8 cm    Across MTP Joint  21 cm    Around Proximal Great Toe  7.6 cm          Objective measurements completed on examination: See above findings.              PT Education - 10/24/17 1312    Education provided  Yes    Education Details  anatomy and physiology of lymphatic system    Person(s) Educated  Patient    Methods  Explanation    Comprehension  Verbalized understanding             [facility] Goals - 10/24/17 1310  CC Long Term Goal  #1   Title  Pt will receive appropriate compression garments for long term management of lymphedema.    Time  4    Period  Weeks    Status  New    Target Date  11/21/17      CC Long Term Goal  #2   Title  Pt will be able to perform compression bandaging indpendently for long term managment of edema    Time  4    Period  Weeks    Status  New    Target Date  11/21/17      CC Long Term Goal  #3   Title  Pt will report a 75% improvement in LLE swelling to decrease risk of infection.    Time  4    Period  Weeks    Status   New    Target Date  11/21/17      CC Long Term Goal  #4   Title  Pt will demonstrate gross 5/5 strength in LLE to allow pt to return to PLOF    Baseline  4/5 grossly    Time  4    Period  Weeks    Status  New    Target Date  11/21/17          Plan - 10/24/17 1304    Clinical Impression Statement  Pt presents to PT for LLE lymphedema from foot to knee following surgery and lymph node biopsy for treatment of malignant melanoma on left calf on 08/02/17. Pt states her leg has been swelling since the surgery. She notices when she is more active she has less swelling than when she sits all day. Her foot swells to the point she is unable to get her shoes on. She also has an area of increased fibrosis in the upper thigh just inferior and medial to her surgical scar. Pt states she has been exercising but since her surgery has felt more weak in her LEs and has not been able to exercise as much. Her LLE is more weak than her RLE. Pt would benefit from skilled PT services to increase LLE strength, decrease swelling in LLE, and assist pt with obtaining garments for long term managment of edema.    History and Personal Factors relevant to plan of care:  none    Clinical Presentation  Stable    Clinical Decision Making  Low    Rehab Potential  Excellent    PT Frequency  3x / week    PT Duration  4 weeks    PT Treatment/Interventions  ADLs/Self Care Home Management;Therapeutic exercise;Patient/family education;Orthotic Fit/Training;Manual techniques;Manual lymph drainage;Compression bandaging;Vasopneumatic Device;Scar mobilization    PT Next Visit Plan  begin MLD to LLE and bandaging to LLE, talk more about FlexiTouch pump    Consulted and Agree with Plan of Care  Patient       Patient will benefit from skilled therapeutic intervention in order to improve the following deficits and impairments:  Pain, Decreased strength, Increased edema  Visit Diagnosis: Lymphedema, not elsewhere classified - Plan:  PT plan of care cert/re-cert  Muscle weakness (generalized) - Plan: PT plan of care cert/re-cert     Problem List Patient Active Problem List   Diagnosis Date Noted  . Genetic testing 09/27/2017  . Family history of brain cancer   . Family history of pancreatic cancer   . Family history of colon cancer   . Family history of ovarian cancer   .  Family history of melanoma   . Malignant melanoma ([place])   . Family history of cancer 05/29/2013  . Essential hypertension 01/09/2009  . Dyslipidemia 10/09/2007    Allyson Sabal [facility] 10/24/2017, 1:15 PM  [place], [place], [address] Phone: [phone]   Fax:  [phone]  Name: Dawn Cole MRN: [id number] Date of Birth: 1966-10-01  Manus Gunning, PT 10/24/17 1:15 PM

## 2017-10-26 ENCOUNTER — Other Ambulatory Visit: Payer: Self-pay

## 2017-10-26 ENCOUNTER — Ambulatory Visit: Payer: BLUE CROSS/BLUE SHIELD | Admitting: Physical Therapy

## 2017-10-26 ENCOUNTER — Encounter: Payer: Self-pay | Admitting: Physical Therapy

## 2017-10-26 DIAGNOSIS — I890 Lymphedema, not elsewhere classified: Secondary | ICD-10-CM

## 2017-10-26 DIAGNOSIS — M6281 Muscle weakness (generalized): Secondary | ICD-10-CM | POA: Diagnosis not present

## 2017-10-26 NOTE — Therapy (Signed)
[place], [place], [address] Phone: [phone]   Fax:  [phone]  Physical Therapy Treatment  Patient Details  Name: Dawn Cole MRN: [id number] Date of Birth: 09/19/1966 Referring Provider: Barry Dienes   Encounter Date: 10/26/2017  PT End of Session - 10/26/17 2993    Visit Number  2    Number of Visits  13    Date for PT Re-Evaluation  11/21/17    PT Start Time  1016    PT Stop Time  1101    PT Time Calculation (min)  45 min    Activity Tolerance  Patient tolerated treatment well    Behavior During Therapy  [facility] for tasks assessed/performed       Past Medical History:  Diagnosis Date  . 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 Family history of brain cancer   . Family history of colon cancer   . Family history of melanoma   . Family history of ovarian cancer   . Family history of pancreatic cancer   . HYPERLIPIDEMIA 10/09/2007  . HYPERTENSION 01/09/2009  . Malignant melanoma ([place])     Past Surgical History:  Procedure Laterality Date  . ABDOMINAL HYSTERECTOMY    . COLONOSCOPY  12/2016  . HYSTEROSCOPY      There were no vitals filed for this visit.  Subjective Assessment - 10/26/17 1019    Subjective  My leg is okay today but that knot is still there. My leg never really hurts but it is just big. The bottom of my foot and around my toes feels tight and sore.     Patient Stated Goals  to get my swelling down and get back to exercising    Currently in Pain?  No/denies                      [facility] Adult PT Treatment/Exercise - 10/26/17 0001      Manual Therapy   Manual Therapy  Manual Lymphatic Drainage (MLD);Compression Bandaging    Manual Lymphatic Drainage (MLD)  short neck, superficial and deep abdominals, left axillary nodes and establishment of inguino axillary pathway, L LE working proximal to distal then retracing all steps    Compression Bandaging  to  LLE: lotion applied then thick stockinette from foot to groin, elastomull to digits 1-4 , 3 rolls of artiflex from foot to knee, 1- 6 cm, 1- 8 cm, 1- 10 cm bandage from foot to knee, then 4 -12 cm starting just below knee to groin                     Long Term Clinic Goals - 10/24/17 1310      CC Long Term Goal  #1   Title  Pt will receive appropriate compression garments for long term management of lymphedema.    Time  4    Period  Weeks    Status  New    Target Date  11/21/17      CC Long Term Goal  #2   Title  Pt will be able to perform compression bandaging indpendently for long term managment of edema    Time  4    Period  Weeks    Status  New    Target Date  11/21/17      CC Long Term Goal  #3   Title  Pt will report a 75% improvement in  LLE swelling to decrease risk of infection.    Time  4    Period  Weeks    Status  New    Target Date  11/21/17      CC Long Term Goal  #4   Title  Pt will demonstrate gross 5/5 strength in LLE to allow pt to return to PLOF    Baseline  4/5 grossly    Time  4    Period  Weeks    Status  New    Target Date  11/21/17         Plan - 10/26/17 1243    Clinical Impression Statement  Began MLD to LLE and then applied compression bandaging to LLE. Covered foot with shoe cover. Pt plans on obtaining a surgical shoes. Will begin instruction in self bandaging next session.     Rehab Potential  Excellent    PT Frequency  3x / week    PT Duration  4 weeks    PT Treatment/Interventions  ADLs/Self Care Home Management;Therapeutic exercise;Patient/family education;Orthotic Fit/Training;Manual techniques;Manual lymph drainage;Compression bandaging;Vasopneumatic Device;Scar mobilization    PT Next Visit Plan  begin instruction in self bandaging, continue MLD to LLE and bandaging to LLE, talk more about FlexiTouch pump    Consulted and Agree with Plan of Care  Patient       Patient will benefit from skilled therapeutic intervention  in order to improve the following deficits and impairments:  Pain, Decreased strength, Increased edema  Visit Diagnosis: Lymphedema, not elsewhere classified     Problem List Patient Active Problem List   Diagnosis Date Noted  . Genetic testing 09/27/2017  . Family history of brain cancer   . Family history of pancreatic cancer   . Family history of colon cancer   . Family history of ovarian cancer   . Family history of melanoma   . Malignant melanoma ([place])   . Family history of cancer 05/29/2013  . Essential hypertension 01/09/2009  . Dyslipidemia 10/09/2007    Allyson Sabal [facility] 10/26/2017, 12:45 PM  [place], [place], [address] Phone: [phone]   Fax:  [phone]  Name: Dawn Cole MRN: [id number] Date of Birth: January 11, 1966  Manus Gunning, PT 10/26/17 12:46 PM

## 2017-10-28 ENCOUNTER — Other Ambulatory Visit: Payer: Self-pay

## 2017-10-28 ENCOUNTER — Encounter: Payer: Self-pay | Admitting: Physical Therapy

## 2017-10-28 ENCOUNTER — Ambulatory Visit: Payer: BLUE CROSS/BLUE SHIELD | Admitting: Physical Therapy

## 2017-10-28 DIAGNOSIS — I890 Lymphedema, not elsewhere classified: Secondary | ICD-10-CM

## 2017-10-28 DIAGNOSIS — M6281 Muscle weakness (generalized): Secondary | ICD-10-CM | POA: Diagnosis not present

## 2017-10-28 NOTE — Therapy (Signed)
[place], [place], [address] Phone: [phone]   Fax:  [phone]  Physical Therapy Treatment  Patient Details  Name: Dawn Cole MRN: [id number] Date of Birth: [birth year] Referring Provider: Barry Dienes   Encounter Date: 10/28/2017  PT End of Session - 10/28/17 1301    Visit Number  3    Number of Visits  13    Date for PT Re-Evaluation  11/21/17    PT Start Time  0845    PT Stop Time  0935    PT Time Calculation (min)  50 min    Activity Tolerance  Patient tolerated treatment well    Behavior During Therapy  [facility] for tasks assessed/performed       Past Medical History:  Diagnosis Date  . 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 Family history of brain cancer   . Family history of colon cancer   . Family history of melanoma   . Family history of ovarian cancer   . Family history of pancreatic cancer   . HYPERLIPIDEMIA 10/09/2007  . HYPERTENSION 01/09/2009  . Malignant melanoma ([place])     Past Surgical History:  Procedure Laterality Date  . ABDOMINAL HYSTERECTOMY    . COLONOSCOPY  12/2016  . HYSTEROSCOPY      There were no vitals filed for this visit.  Subjective Assessment - 10/28/17 1257    Subjective  Pt had problems with bandages sliding down from thigh the same day.  She and her husband tried to reapply them as best they could. She has some soreness between toes     Pertinent History  WLE/advanced flap closure and inguinal lymph SNLB 08/02/17 for malignant melanoma    Patient Stated Goals  to get my swelling down and get back to exercising    Currently in Pain?  No/denies            LYMPHEDEMA/ONCOLOGY QUESTIONNAIRE - 10/28/17 0856      Left Lower Extremity Lymphedema   30 cm Proximal to Suprapatella  68 cm    20 cm Proximal to Suprapatella  64 cm    10 cm Proximal to Suprapatella  52 cm    At Midpatella/Popliteal Crease  39.5 cm    30 cm Proximal to Floor at  Lateral Plantar Foot  38.6 cm    20 cm Proximal to Floor at Lateral Plantar Foot  31.5 cm    10 cm Proximal to Floor at Lateral Malleoli  23 cm    5 cm Proximal to 1st MTP Joint  21 cm    Across MTP Joint  20.5 cm    Around Proximal Great Toe  7 cm               [facility] Adult PT Treatment/Exercise - 10/28/17 0001      Self-Care   Self-Care  Other Self-Care Comments    Other Self-Care Comments   talked with patient about trying bike shorts over bandages to help keep them up,  gave piece of foam to wear over firm spot on upper mid thigh in bike shorts  [place] training DVD for self MLD at home and handout for bandaging       Manual Therapy   Manual Therapy  Edema management    Edema Management  remeasured legs     Manual Lymphatic Drainage (MLD)  Briefly short neck, superficial and deep abdominals, left axillary nodes and establishment of inguino  axillary pathway, L LE working proximal to distal then retracing all steps    Compression Bandaging  to LLE: lotion applied then thin stockinette from foot to groin, elastomull to digits 1-4 , 3 rolls of artiflex from foot to knee, 1- 6 cm, 1- 8 cm, 1- 10 cm bandage from foot to knee, then 4 -12 cm starting just below knee to groin                     Long Term Clinic Goals - 10/28/17 1306      CC Long Term Goal  #1   Title  Pt will receive appropriate compression garments for long term management of lymphedema.    Period  Weeks    Status  On-going      CC Long Term Goal  #2   Title  Pt will be able to perform compression bandaging indpendently for long term managment of edema    Time  4    Period  Weeks    Status  On-going      CC Long Term Goal  #3   Title  Pt will report a 75% improvement in LLE swelling to decrease risk of infection.    Time  4    Period  Weeks    Status  On-going      CC Long Term Goal  #4   Title  Pt will demonstrate gross 5/5 strength in LLE to allow pt to return to PLOF    Baseline  4/5  grossly    Time  4    Period  Weeks    Status  On-going         Plan - 10/28/17 1301    Clinical Impression Statement  Pt has good reduction in foot and lower leg with inital bandaging. She had diffculty keeping ccompression on thigh so it is still congested.  Talked about option of bike shorts over bandages to help keep them up.  Also issued [place] training DVD for self MLD and wirtten handout for bandaging as pt and husband are trying on their own in case bandages come off over the weekend.  OK for pt to take them off to shower before PT on Monday but she will leave them on Wednesday mornings for measurement    Rehab Potential  Excellent    PT Frequency  3x / week    PT Duration  4 weeks    PT Treatment/Interventions  ADLs/Self Care Home Management;Therapeutic exercise;Patient/family education;Orthotic Fit/Training;Manual techniques;Manual lymph drainage;Compression bandaging;Vasopneumatic Device;Scar mobilization    PT Next Visit Plan  See if pt has gotten bike shorts to try begin instruction in self bandaging, continue MLD to LLE and bandaging to LLE, talk more about FlexiTouch pump    Recommended Other Services  Pt will have measurements on Wednesdays        Patient will benefit from skilled therapeutic intervention in order to improve the following deficits and impairments:  Pain, Decreased strength, Increased edema  Visit Diagnosis: Lymphedema, not elsewhere classified  Muscle weakness (generalized)     Problem List Patient Active Problem List   Diagnosis Date Noted  . Genetic testing 09/27/2017  . Family history of brain cancer   . Family history of pancreatic cancer   . Family history of colon cancer   . Family history of ovarian cancer   . Family history of melanoma   . Malignant melanoma ([place])   . Family history of cancer 05/29/2013  .  Essential hypertension 01/09/2009  . Dyslipidemia 10/09/2007   Donato Heinz. Owens Shark PT  Norwood Levo 10/28/2017, 1:08  PM  [place], [place], [address] Phone: [phone]   Fax:  [phone]  Name: Dawn Cole MRN: [id number] Date of Birth: [birth year]

## 2017-10-28 NOTE — Patient Instructions (Signed)

## 2017-10-31 ENCOUNTER — Ambulatory Visit: Payer: BLUE CROSS/BLUE SHIELD

## 2017-10-31 DIAGNOSIS — I890 Lymphedema, not elsewhere classified: Secondary | ICD-10-CM | POA: Diagnosis not present

## 2017-10-31 DIAGNOSIS — M6281 Muscle weakness (generalized): Secondary | ICD-10-CM | POA: Diagnosis not present

## 2017-10-31 NOTE — Therapy (Signed)
[place], [place], [address] Phone: [phone]   Fax:  [phone]  Physical Therapy Treatment  Patient Details  Name: Dawn Cole MRN: [id number] Date of Birth: Feb 26, 1966 Referring Provider: Barry Dienes   Encounter Date: 10/31/2017  PT End of Session - 10/31/17 0858    Visit Number  4    Number of Visits  13    Date for PT Re-Evaluation  11/21/17    PT Start Time  0804    PT Stop Time  8588    PT Time Calculation (min)  53 min    Activity Tolerance  Patient tolerated treatment well    Behavior During Therapy  [facility] for tasks assessed/performed       Past Medical History:  Diagnosis Date  . 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 Family history of brain cancer   . Family history of colon cancer   . Family history of melanoma   . Family history of ovarian cancer   . Family history of pancreatic cancer   . HYPERLIPIDEMIA 10/09/2007  . HYPERTENSION 01/09/2009  . Malignant melanoma ([place])     Past Surgical History:  Procedure Laterality Date  . ABDOMINAL HYSTERECTOMY    . COLONOSCOPY  12/2016  . HYSTEROSCOPY      There were no vitals filed for this visit.  Subjective Assessment - 10/31/17 0809    Subjective  I left my banadages on until this morning so I could shower. The yoga pants worked really over my bandages keeping them up and it helped keep the foam in palce as well I can tell the most improvement so far is at my Lt knee and above my incision. It's just amazing the difference so far!    Pertinent History  WLE/advanced flap closure and inguinal lymph SNLB 08/02/17 for malignant melanoma    Patient Stated Goals  to get my swelling down and get back to exercising    Currently in Pain?  No/denies                      [facility] Adult PT Treatment/Exercise - 10/31/17 0001      Manual Therapy   Manual Therapy  Manual Lymphatic Drainage (MLD);Compression Bandaging    Manual therapy comments  Made chip pack in thick stockinette for pt to wear at groin when sitting for prolonged periods of time at increased area of swelling at medial groin    Manual Lymphatic Drainage (MLD)  Briefly short neck, superficial and deep abdominals, left axillary nodes and establishment of inguino axillary pathway, L LE working proximal to distal then retracing all steps    Compression Bandaging  to Lt LE: lotion applied then thin stockinette from foot to knee, then knee to groin, elastomull to digits 1-4 , 2 rolls of artiflex from foot to knee, 1- 6 cm, 2- 8 cm (one from ankle to knee), 1- 10 cm bandage from foot to knee, then 4 -12 cm from knee to groin doing third one in herring bone fashion and last from mid shin to groin. Then assisted pt with donning yoga capri pants over bandages at end of session.                      Long Term Clinic Goals - 10/28/17 1306      CC Long Term Goal  #1   Title  Pt will receive appropriate  compression garments for long term management of lymphedema.    Period  Weeks    Status  On-going      CC Long Term Goal  #2   Title  Pt will be able to perform compression bandaging indpendently for long term managment of edema    Time  4    Period  Weeks    Status  On-going      CC Long Term Goal  #3   Title  Pt will report a 75% improvement in LLE swelling to decrease risk of infection.    Time  4    Period  Weeks    Status  On-going      CC Long Term Goal  #4   Title  Pt will demonstrate gross 5/5 strength in LLE to allow pt to return to PLOF    Baseline  4/5 grossly    Time  4    Period  Weeks    Status  On-going         Plan - 10/31/17 0902    Clinical Impression Statement  Pt reports bandages stayed much better this round of bandaging. They slid some down thigh but not as much. Added herring bone fashion to one of thigh bandages to see if this will further help keep bandages in place. Also assisted pt with donning yoga  capri pants over bandages at end of session. These were very snug which should also be beneficial in helping pt to see further reductions and help keep banadages in place. Issued chip pack for pt to wear in yoga pants at medial groin swelling which she reports will wear during prolonged periods of sitting. Overall pt is very pleased with reductions attained thus far and is eager to see how ewll her leg will continue to do.     Rehab Potential  Excellent    PT Frequency  3x / week    PT Duration  4 weeks    PT Treatment/Interventions  ADLs/Self Care Home Management;Therapeutic exercise;Patient/family education;Orthotic Fit/Training;Manual techniques;Manual lymph drainage;Compression bandaging;Vasopneumatic Device;Scar mobilization    PT Next Visit Plan  Remeasure LE. Try begin instruction in self bandaging (briefly educated pt on this today on how to reroll bandages tightly and apply in spiral fashion) and MLD (began this today instructing briefly while performing down to anastomosis and exposed thigh redicrectring to anastomosis),continue MLD to LLE and bandaging to LLE, talk more about FlexiTouch pump    Consulted and Agree with Plan of Care  Patient       Patient will benefit from skilled therapeutic intervention in order to improve the following deficits and impairments:  Pain, Decreased strength, Increased edema  Visit Diagnosis: Lymphedema, not elsewhere classified     Problem List Patient Active Problem List   Diagnosis Date Noted  . Genetic testing 09/27/2017  . Family history of brain cancer   . Family history of pancreatic cancer   . Family history of colon cancer   . Family history of ovarian cancer   . Family history of melanoma   . Malignant melanoma ([place])   . Family history of cancer 05/29/2013  . Essential hypertension 01/09/2009  . Dyslipidemia 10/09/2007    [name], PTA 10/31/2017, 9:08 AM  [place], [place], [address] Phone: [phone]   Fax:  [phone]  Name: Dawn Cole MRN: [id number] Date of Birth: 03/22/1966

## 2017-11-02 ENCOUNTER — Encounter: Payer: Self-pay | Admitting: Physical Therapy

## 2017-11-02 ENCOUNTER — Ambulatory Visit: Payer: BLUE CROSS/BLUE SHIELD | Admitting: Physical Therapy

## 2017-11-02 DIAGNOSIS — M6281 Muscle weakness (generalized): Secondary | ICD-10-CM | POA: Diagnosis not present

## 2017-11-02 DIAGNOSIS — I890 Lymphedema, not elsewhere classified: Secondary | ICD-10-CM

## 2017-11-02 NOTE — Therapy (Signed)
[place], [place], [address] Phone: [phone]   Fax:  [phone]  Physical Therapy Treatment  Patient Details  Name: Dawn Cole MRN: [id number] Date of Birth: 1966-07-23 Referring Provider: Barry Dienes   Encounter Date: 11/02/2017  PT End of Session - 11/02/17 0846    Visit Number  5    Number of Visits  13    Date for PT Re-Evaluation  11/21/17    PT Start Time  0805    PT Stop Time  7616    PT Time Calculation (min)  39 min    Activity Tolerance  Patient tolerated treatment well    Behavior During Therapy  [facility] for tasks assessed/performed       Past Medical History:  Diagnosis Date  . 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 Family history of brain cancer   . Family history of colon cancer   . Family history of melanoma   . Family history of ovarian cancer   . Family history of pancreatic cancer   . HYPERLIPIDEMIA 10/09/2007  . HYPERTENSION 01/09/2009  . Malignant melanoma ([place])     Past Surgical History:  Procedure Laterality Date  . ABDOMINAL HYSTERECTOMY    . COLONOSCOPY  12/2016  . HYSTEROSCOPY      There were no vitals filed for this visit.  Subjective Assessment - 11/02/17 0807    Subjective  My bandages held on well. They just now started to loosen up.     Pertinent History  WLE/advanced flap closure and inguinal lymph SNLB 08/02/17 for malignant melanoma    Patient Stated Goals  to get my swelling down and get back to exercising    Currently in Pain?  No/denies            LYMPHEDEMA/ONCOLOGY QUESTIONNAIRE - 11/02/17 0808      Left Lower Extremity Lymphedema   30 cm Proximal to Suprapatella  69.5 cm    20 cm Proximal to Suprapatella  62 cm    10 cm Proximal to Suprapatella  52.5 cm    At Midpatella/Popliteal Crease  39.1 cm    30 cm Proximal to Floor at Lateral Plantar Foot  39.6 cm    20 cm Proximal to Floor at Lateral Plantar Foot  31.3 cm    10 cm  Proximal to Floor at Lateral Malleoli  22.9 cm    5 cm Proximal to 1st MTP Joint  20.6 cm    Across MTP Joint  21 cm    Around Proximal Great Toe  7.3 cm               [facility] Adult PT Treatment/Exercise - 11/02/17 0001      Manual Therapy   Manual therapy comments  circumferential measurements taken    Edema Management  educated pt in proper bandaging technique throughout    Compression Bandaging  to LLE: lotion applied then thin stockinette from foot to groin, elastomull to digits 1-4 , 2 rolls of artiflex from foot to knee, 1- 6 cm, 1- 8 cm, 1- 10 cm bandage from foot to knee, then 4 -12 cm starting just below knee to groin-(3rd one applied in herringbone fasion) added grey foam to area of swelling at upper thigh prior to applying compression bandaging over this area                     [place] -  10/28/17 1306      CC Long Term Goal  #1   Title  Pt will receive appropriate compression garments for long term management of lymphedema.    Period  Weeks    Status  On-going      CC Long Term Goal  #2   Title  Pt will be able to perform compression bandaging indpendently for long term managment of edema    Time  4    Period  Weeks    Status  On-going      CC Long Term Goal  #3   Title  Pt will report a 75% improvement in LLE swelling to decrease risk of infection.    Time  4    Period  Weeks    Status  On-going      CC Long Term Goal  #4   Title  Pt will demonstrate gross 5/5 strength in LLE to allow pt to return to PLOF    Baseline  4/5 grossly    Time  4    Period  Weeks    Status  On-going         Plan - 11/02/17 0846    Clinical Impression Statement  Pt reports her bandages stayed in place very well. Circumferential meausrements taken today. Pt demonstrates futher reduction at 20 cm proximal to knee. Educated pt in proper compression bandaging technique today. Also educated pt about compression pump and pt was interested so a facesheet  will be sent to [facility] so pt can receive a demonstration. Pt agrees with this. Continued with compression bandaging from foot to groin.     Rehab Potential  Excellent    PT Frequency  3x / week    PT Duration  4 weeks    PT Treatment/Interventions  ADLs/Self Care Home Management;Therapeutic exercise;Patient/family education;Orthotic Fit/Training;Manual techniques;Manual lymph drainage;Compression bandaging;Vasopneumatic Device;Scar mobilization    PT Next Visit Plan  Continue instruction in self bandaging as therapist applies bandages, and MLD (began this today instructing briefly while performing down to anastomosis and exposed thigh redicrectring to anastomosis),continue MLD to LLE and bandaging to LLE    PT Home Exercise Plan  facesheet sent to [facility] and Agree with Plan of Care  Patient       Patient will benefit from skilled therapeutic intervention in order to improve the following deficits and impairments:  Pain, Decreased strength, Increased edema  Visit Diagnosis: Lymphedema, not elsewhere classified     Problem List Patient Active Problem List   Diagnosis Date Noted  . Genetic testing 09/27/2017  . Family history of brain cancer   . Family history of pancreatic cancer   . Family history of colon cancer   . Family history of ovarian cancer   . Family history of melanoma   . Malignant melanoma ([place])   . Family history of cancer 05/29/2013  . Essential hypertension 01/09/2009  . Dyslipidemia 10/09/2007    Allyson Sabal [facility] 11/02/2017, 9:02 AM  [place], [place], [address] Phone: [phone]   Fax:  [phone]  Name: YAZMYN VALBUENA MRN: [id number] Date of Birth: Jun 20, 1966  Manus Gunning, PT 11/02/17 9:02 AM

## 2017-11-04 ENCOUNTER — Encounter: Payer: Self-pay | Admitting: Physical Therapy

## 2017-11-04 ENCOUNTER — Ambulatory Visit: Payer: BLUE CROSS/BLUE SHIELD | Admitting: Physical Therapy

## 2017-11-04 DIAGNOSIS — M6281 Muscle weakness (generalized): Secondary | ICD-10-CM | POA: Diagnosis not present

## 2017-11-04 DIAGNOSIS — I890 Lymphedema, not elsewhere classified: Secondary | ICD-10-CM

## 2017-11-04 NOTE — Therapy (Signed)
[place], [place], [address] Phone: [phone]   Fax:  [phone]  Physical Therapy Treatment  Patient Details  Name: Dawn Cole MRN: [id number] Date of Birth: 05/02/1966 Referring Provider: Barry Dienes   Encounter Date: 11/04/2017  PT End of Session - 11/04/17 0857    Visit Number  6    Number of Visits  13    Date for PT Re-Evaluation  11/21/17    PT Start Time  0802    PT Stop Time  0852    PT Time Calculation (min)  50 min    Activity Tolerance  Patient tolerated treatment well    Behavior During Therapy  [facility] for tasks assessed/performed       Past Medical History:  Diagnosis Date  . 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 Family history of brain cancer   . Family history of colon cancer   . Family history of melanoma   . Family history of ovarian cancer   . Family history of pancreatic cancer   . HYPERLIPIDEMIA 10/09/2007  . HYPERTENSION 01/09/2009  . Malignant melanoma ([place])     Past Surgical History:  Procedure Laterality Date  . ABDOMINAL HYSTERECTOMY    . COLONOSCOPY  12/2016  . HYSTEROSCOPY    . LIPOMA EXCISION Left 08/02/2017   Procedure: WIDE LOCAL EXCISION WITH ADVANCEMENT FLAP CLOSURE LEFT LOWER LEG AND SENTINEL LYMPH NODE MAPPING AND BIOPOSY;  Surgeon: [name], MD;  Location: [place];  Service: General;  Laterality: Left;  GENERAL AND LOCAL    There were no vitals filed for this visit.  Subjective Assessment - 11/04/17 0805    Subjective  pt thinks she sees benefit from the chip pack to the groin. She comes in with bandages on today and they just started to slip earlier today     Pertinent History  WLE/advanced flap closure and inguinal lymph SNLB 08/02/17 for malignant melanoma    Patient Stated Goals  to get my swelling down and get back to exercising    Currently in Pain?  No/denies                      [facility] Adult PT Treatment/Exercise  - 11/04/17 0001      Manual Therapy   Edema Management  educated pt in proper bandaging technique throughout tried large dotted foam at medial thigh as pt continues to have firnmess here.  Talked about spandex or "bike shorts" for medial and upper thigh compression also.  pt may be able to borrow some from her daughter to try .  also reviewed exercises to do in bandages .  includeing hip ROM and core work.    Manual Lymphatic Drainage (MLD)  Briefly short neck, superficial and deep abdominals, left axillary nodes and establishment of inguino axillary pathway, L LE working proximal to distal then retracing all steps    Compression Bandaging  to LLE: lotion applied then thin stockinette from foot to groin, elastomull to digits 1-4 , 2 rolls of artiflex from foot to knee, 1- 6 cm, 1- 8 cm, 1- 10 cm bandage from foot to knee, then 4 -12 cm starting just below knee to groin-(3rd one applied in herringbone fasion) added grey foam to area of swelling at upper thigh prior to applying compression bandaging over this area  Long Term Clinic Goals - 10/28/17 1306      CC Long Term Goal  #1   Title  Pt will receive appropriate compression garments for long term management of lymphedema.    Period  Weeks    Status  On-going      CC Long Term Goal  #2   Title  Pt will be able to perform compression bandaging indpendently for long term managment of edema    Time  4    Period  Weeks    Status  On-going      CC Long Term Goal  #3   Title  Pt will report a 75% improvement in LLE swelling to decrease risk of infection.    Time  4    Period  Weeks    Status  On-going      CC Long Term Goal  #4   Title  Pt will demonstrate gross 5/5 strength in LLE to allow pt to return to PLOF    Baseline  4/5 grossly    Time  4    Period  Weeks    Status  On-going         Plan - 11/04/17 0859    Clinical Impression Statement  Pt is doing well, especially in lower leg by visual  inspection.  She still has visible and palpable fullness in thigh, especailly in upper mid thigh just distal and medial to scar.  She will try added compression to this area over the weekend.     Rehab Potential  Excellent    PT Frequency  3x / week    PT Duration  4 weeks    PT Next Visit Plan  Continue instruction in self bandaging as therapist applies bandages, and MLD check to see if pt was able to watch video.  teach husband when/if he will be able to come in        Patient will benefit from skilled therapeutic intervention in order to improve the following deficits and impairments:  Pain, Decreased strength, Increased edema  Visit Diagnosis: Lymphedema, not elsewhere classified  Muscle weakness (generalized)     Problem List Patient Active Problem List   Diagnosis Date Noted  . Genetic testing 09/27/2017  . Family history of brain cancer   . Family history of pancreatic cancer   . Family history of colon cancer   . Family history of ovarian cancer   . Family history of melanoma   . Malignant melanoma ([place])   . Family history of cancer 05/29/2013  . Essential hypertension 01/09/2009  . Dyslipidemia 10/09/2007   Donato Heinz. Owens Shark PT  Norwood Levo 11/04/2017, 9:02 AM  [place], [place], [address] Phone: [phone]   Fax:  [phone]  Name: HOLLE SPRICK MRN: [id number] Date of Birth: 1966/09/17

## 2017-11-07 ENCOUNTER — Encounter: Payer: Self-pay | Admitting: Physical Therapy

## 2017-11-07 ENCOUNTER — Ambulatory Visit: Payer: BLUE CROSS/BLUE SHIELD | Admitting: Physical Therapy

## 2017-11-07 DIAGNOSIS — M6281 Muscle weakness (generalized): Secondary | ICD-10-CM

## 2017-11-07 DIAGNOSIS — I890 Lymphedema, not elsewhere classified: Secondary | ICD-10-CM

## 2017-11-07 NOTE — Therapy (Signed)
[place], [place], [address] Phone: [phone]   Fax:  [phone]  Physical Therapy Treatment  Patient Details  Name: Dawn Cole MRN: [id number] Date of Birth: 1966-06-05 Referring Provider: Barry Dienes   Encounter Date: 11/07/2017  PT End of Session - 11/07/17 1022    Visit Number  7    Number of Visits  13    Date for PT Re-Evaluation  11/21/17    PT Start Time  0801    PT Stop Time  0859    PT Time Calculation (min)  58 min    Activity Tolerance  Patient tolerated treatment well    Behavior During Therapy  [facility] for tasks assessed/performed       Past Medical History:  Diagnosis Date  . 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 Family history of brain cancer   . Family history of colon cancer   . Family history of melanoma   . Family history of ovarian cancer   . Family history of pancreatic cancer   . HYPERLIPIDEMIA 10/09/2007  . HYPERTENSION 01/09/2009  . Malignant melanoma ([place])     Past Surgical History:  Procedure Laterality Date  . ABDOMINAL HYSTERECTOMY    . COLONOSCOPY  12/2016  . HYSTEROSCOPY    . WIDE LOCAL EXCISION WITH ADVANCEMENT FLAP CLOSURE LEFT LOWER LEG AND SENTINEL LYMPH NODE MAPPING AND BIOPOSY Left 08/02/2017   Performed by [name], MD at [facility] OR    There were no vitals filed for this visit.  Subjective Assessment - 11/07/17 0809    Subjective  Patient unwrapped her leg this morning and kept bandages on all weekend. She had to rewrap the top ofher leg but kept everything on. I haven't watched the MLD video yet.    Pertinent History  WLE/advanced flap closure and inguinal lymph SNLB 08/02/17 for malignant melanoma    Patient Stated Goals  to get my swelling down and get back to exercising    Currently in Pain?  No/denies                      [facility] Adult PT Treatment/Exercise - 11/07/17 0001      Manual Therapy   Manual Therapy   Manual Lymphatic Drainage (MLD);Compression Bandaging    Manual Lymphatic Drainage (MLD)  Diaghramatic breathing in supine, short neck, superficial and deep abdominals, left axillary nodes and establishment of inguino axillary pathway, L LE working proximal to distal then retracing all steps    Compression Bandaging  to LLE: lotion applied then thin stockinette from foot to groin, elastomull to digits 1-3 (4th toe was irritated) , 2 rolls of artiflex from foot to knee and 1 from knee to mid thigh, 1- 6 cm, 1- 8 cm, 1- 10 cm bandage from foot to knee, then 4 -12 cm starting just below knee to groin. Applied peach large dot foam to medial proximal thigh to reduce fibrosis and then last 12 cm bandage applied. Lenkelast applied to proximal thigh to assist with keeping bandages up.             PT Education - 11/07/17 1022    Education provided  Yes    Education Details  Instructed patient with washing and drying bandages and encouraged her to do that tomorrow night and sleep one night without bandages to let them dry.    Person(s) Educated  Patient    Methods  Explanation    Comprehension  Verbalized understanding             Long Term Clinic Goals - 10/28/17 1306      CC Long Term Goal  #1   Title  Pt will receive appropriate compression garments for long term management of lymphedema.    Period  Weeks    Status  On-going      CC Long Term Goal  #2   Title  Pt will be able to perform compression bandaging indpendently for long term managment of edema    Time  4    Period  Weeks    Status  On-going      CC Long Term Goal  #3   Title  Pt will report a 75% improvement in LLE swelling to decrease risk of infection.    Time  4    Period  Weeks    Status  On-going      CC Long Term Goal  #4   Title  Pt will demonstrate gross 5/5 strength in LLE to allow pt to return to PLOF    Baseline  4/5 grossly    Time  4    Period  Weeks    Status  On-going         Plan - 11/07/17  1023    Clinical Impression Statement  Patient appears to be reducing consistently. It will be beneficial for bandages to be washed and regain some compression. She continues to have an area of palpable fullness present on her medial proximal thigh that need attention with bandaging. She may benefit from ribbed peach foam?  She plans to reschedule her [employer] demo as they were scheduled to do that after her next PT visit but that would require her to be unwrapped for the long Thanksgiving weekend which was not a good option.    Rehab Potential  Excellent    PT Frequency  3x / week    PT Duration  4 weeks    PT Treatment/Interventions  ADLs/Self Care Home Management;Therapeutic exercise;Patient/family education;Orthotic Fit/Training;Manual techniques;Manual lymph drainage;Compression bandaging;Vasopneumatic Device;Scar mobilization    PT Next Visit Plan  Instruct pt's husband with compression bandaging. Continue complete decongestive therapy. Send demographics to [employer] for day and nighttime compression garments if she is in-network with [employer] (e-mailed her today to ask that and am awaiting response.)    Consulted and Agree with Plan of Care  Patient       Patient will benefit from skilled therapeutic intervention in order to improve the following deficits and impairments:  Pain, Decreased strength, Increased edema  Visit Diagnosis: Lymphedema, not elsewhere classified  Muscle weakness (generalized)     Problem List Patient Active Problem List   Diagnosis Date Noted  . Genetic testing 09/27/2017  . Family history of brain cancer   . Family history of pancreatic cancer   . Family history of colon cancer   . Family history of ovarian cancer   . Family history of melanoma   . Malignant melanoma ([place])   . Family history of cancer 05/29/2013  . Essential hypertension 01/09/2009  . Dyslipidemia 10/09/2007    Mykeal Carrick,MARTI [place] 11/07/2017, 10:28 AM  [place], [place], [address] Phone: [phone]   Fax:  [phone]  Name: MELANY WIESMAN MRN: [id number] Date of Birth: [birth year]

## 2017-11-09 ENCOUNTER — Ambulatory Visit: Payer: BLUE CROSS/BLUE SHIELD

## 2017-11-09 DIAGNOSIS — M6281 Muscle weakness (generalized): Secondary | ICD-10-CM

## 2017-11-09 DIAGNOSIS — I890 Lymphedema, not elsewhere classified: Secondary | ICD-10-CM

## 2017-11-09 NOTE — Therapy (Signed)
[place], [place], [address] Phone: [phone]   Fax:  [phone]  Physical Therapy Treatment  Patient Details  Name: Dawn Cole MRN: [id number] Date of Birth: June 09, 1966 Referring Provider: Barry Dienes   Encounter Date: 11/09/2017  PT End of Session - 11/09/17 1048    Visit Number  8    Number of Visits  13    Date for PT Re-Evaluation  11/21/17    PT Start Time  0845    PT Stop Time  0940    PT Time Calculation (min)  55 min    Activity Tolerance  Patient tolerated treatment well    Behavior During Therapy  [facility] for tasks assessed/performed       Past Medical History:  Diagnosis Date  . 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 Family history of brain cancer   . Family history of colon cancer   . Family history of melanoma   . Family history of ovarian cancer   . Family history of pancreatic cancer   . HYPERLIPIDEMIA 10/09/2007  . HYPERTENSION 01/09/2009  . Malignant melanoma ([place])     Past Surgical History:  Procedure Laterality Date  . ABDOMINAL HYSTERECTOMY    . COLONOSCOPY  12/2016  . HYSTEROSCOPY    . LIPOMA EXCISION Left 08/02/2017   Procedure: WIDE LOCAL EXCISION WITH ADVANCEMENT FLAP CLOSURE LEFT LOWER LEG AND SENTINEL LYMPH NODE MAPPING AND BIOPOSY;  Surgeon: [name], MD;  Location: [place];  Service: General;  Laterality: Left;  GENERAL AND LOCAL    There were no vitals filed for this visit.  Subjective Assessment - 11/09/17 0848    Subjective  The white bandage she added last time seemed to help the bandage not slip as much. Also the Artiflex not being as high on my leg was better too. The bandage still slid some, but not as much.     Pertinent History  WLE/advanced flap closure and inguinal lymph SNLB 08/02/17 for malignant melanoma    Patient Stated Goals  to get my swelling down and get back to exercising    Currently in Pain?  No/denies                       [facility] Adult PT Treatment/Exercise - 11/09/17 0001      Manual Therapy   Manual Therapy  Manual Lymphatic Drainage (MLD);Compression Bandaging    Manual Lymphatic Drainage (MLD)  Performed this briefly today to leave time to instruct husband in bandaging: Diaghramatic breathing in supine, short neck, superficial and deep abdominals, left axillary nodes and establishment of inguino axillary pathway, Lt LE, just thigh today, then retracing all steps    Compression Bandaging  to LLE: lotion applied then thin stockinette from foot to groin, elastomull to digits 1-3 (4th toe was irritated) , 2 rolls of artiflex from foot to knee and 1 from knee to mid thigh, 1- 6 cm, 1- 8 cm, 1- 10 cm bandage from foot to knee, then 4 -12 cm starting just below knee to groin. Applied peach large dot foam to medial proximal thigh to reduce fibrosis and then last 12 cm bandage applied. Lenkelast applied to proximal thigh to assist with keeping bandages up. Instructed husband, Bobby Rumpf, in this while performing today and answering his questions throughout.              PT Education - 11/09/17 1046  Education provided  Yes    Education Details  Instructed husband and reviewed with pt while bandaging today and answered his questions throughout. Husband also had good questions about anatomy of lymphatic system so educated about this while performing MLD today as well.     Person(s) Educated  Patient;Spouse    Methods  Explanation;Demonstration they reported already having handout    Comprehension  Verbalized understanding;Need further instruction             [facility] Goals - 10/28/17 1306      CC Long Term Goal  #1   Title  Pt will receive appropriate compression garments for long term management of lymphedema.    Period  Weeks    Status  On-going      CC Long Term Goal  #2   Title  Pt will be able to perform compression bandaging indpendently for long term  managment of edema    Time  4    Period  Weeks    Status  On-going      CC Long Term Goal  #3   Title  Pt will report a 75% improvement in LLE swelling to decrease risk of infection.    Time  4    Period  Weeks    Status  On-going      CC Long Term Goal  #4   Title  Pt will demonstrate gross 5/5 strength in LLE to allow pt to return to PLOF    Baseline  4/5 grossly    Time  4    Period  Weeks    Status  On-going         Plan - 11/09/17 1055    Clinical Impression Statement  Pts husband, Lewis, present for treatment today and was instructed in bandaging while reviewing with pt. They report having handout at home but may want [place] to videorecord [place] bandaging next visit as well for extra instruction. Pt tolerated session very well and reports she feels ready to be measured for compression garment at this time. (After appt she called [facility] and they are able to see her Friday, her husband will rewrap her for the weekend).     Rehab Potential  Excellent    PT Frequency  3x / week    PT Duration  4 weeks    PT Treatment/Interventions  ADLs/Self Care Home Management;Therapeutic exercise;Patient/family education;Orthotic Fit/Training;Manual techniques;Manual lymph drainage;Compression bandaging;Vasopneumatic Device;Scar mobilization    PT Next Visit Plan  Review with pt's husband compression bandaging if he returns. Pt may want to record [place] bandaging with her phone next session. Continue complete decongestive therapy. Pt decided to go to [facility] as they can see her Friday and they accept [employer].    Recommended Other Services  Faxed srcipt to Dr. Barry Dienes for compression garments.       Patient will benefit from skilled therapeutic intervention in order to improve the following deficits and impairments:  Pain, Decreased strength, Increased edema  Visit Diagnosis: Lymphedema, not elsewhere classified  Muscle weakness (generalized)     Problem List Patient Active Problem  List   Diagnosis Date Noted  . Genetic testing 09/27/2017  . Family history of brain cancer   . Family history of pancreatic cancer   . Family history of colon cancer   . Family history of ovarian cancer   . Family history of melanoma   . Malignant melanoma ([place])   . Family history of cancer  05/29/2013  . Essential hypertension 01/09/2009  . Dyslipidemia 10/09/2007    [name], PTA 11/09/2017, 11:02 AM  [place], [place], [address] Phone: [phone]   Fax:  [phone]  Name: ALBERTA LENHARD MRN: [id number] Date of Birth: 07-Sep-1966

## 2017-11-14 ENCOUNTER — Ambulatory Visit: Payer: BLUE CROSS/BLUE SHIELD

## 2017-11-14 DIAGNOSIS — I890 Lymphedema, not elsewhere classified: Secondary | ICD-10-CM

## 2017-11-14 DIAGNOSIS — M6281 Muscle weakness (generalized): Secondary | ICD-10-CM | POA: Diagnosis not present

## 2017-11-14 NOTE — Therapy (Signed)
[place], [place], [address] Phone: [phone]   Fax:  [phone]  Physical Therapy Treatment  Patient Details  Name: Dawn Cole MRN: [id number] Date of Birth: 08-09-1966 Referring Provider: Barry Dienes   Encounter Date: 11/14/2017  PT End of Session - 11/14/17 0848    Visit Number  9    Number of Visits  13    Date for PT Re-Evaluation  11/21/17    PT Start Time  0803    PT Stop Time  6606    PT Time Calculation (min)  44 min    Activity Tolerance  Patient tolerated treatment well    Behavior During Therapy  [facility] for tasks assessed/performed       Past Medical History:  Diagnosis Date  . 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 Family history of brain cancer   . Family history of colon cancer   . Family history of melanoma   . Family history of ovarian cancer   . Family history of pancreatic cancer   . HYPERLIPIDEMIA 10/09/2007  . HYPERTENSION 01/09/2009  . Malignant melanoma ([place])     Past Surgical History:  Procedure Laterality Date  . ABDOMINAL HYSTERECTOMY    . COLONOSCOPY  12/2016  . HYSTEROSCOPY    . LIPOMA EXCISION Left 08/02/2017   Procedure: WIDE LOCAL EXCISION WITH ADVANCEMENT FLAP CLOSURE LEFT LOWER LEG AND SENTINEL LYMPH NODE MAPPING AND BIOPOSY;  Surgeon: [name], MD;  Location: [place];  Service: General;  Laterality: Left;  GENERAL AND LOCAL    There were no vitals filed for this visit.  Subjective Assessment - 11/14/17 0809    Subjective  I was measured for my compression garment Friday and my husband rewrapped my bandages over the weekend and he did pretty good! The foot was a little tight but other than that it was good.     Pertinent History  WLE/advanced flap closure and inguinal lymph SNLB 08/02/17 for malignant melanoma    Patient Stated Goals  to get my swelling down and get back to exercising    Currently in Pain?  No/denies    Multiple Pain Sites   No                      [facility] Adult PT Treatment/Exercise - 11/14/17 0001      Manual Therapy   Manual Therapy  Manual Lymphatic Drainage (MLD);Compression Bandaging    Manual Lymphatic Drainage (MLD)  Performed this briefly today to leave time to instruct husband in bandaging: Diaghramatic breathing in supine, short neck, superficial and deep abdominals, left axillary nodes and establishment of inguino axillary pathway, Lt LE working from proximal to distal, then retracing all steps    Compression Bandaging  to LLE: lotion applied then thin stockinette from foot to groin, elastomull to digits 1-3 (4th toe was irritated) , 2 rolls of artiflex from foot to knee and 1 from knee to mid thigh, 1- 6 cm, 1- 8 cm, 1- 10 cm bandage from foot to knee, then 4 -12 cm starting just below knee to groin. Applied peach large dot foam to medial proximal thigh to reduce fibrosis and then last 12 cm bandage applied. Lenkelast applied to proximal thigh to assist with keeping bandages up. Pt videorecorded while therapist performed.                      Long  Term Clinic Goals - 10/28/17 1306      CC Long Term Goal  #1   Title  Pt will receive appropriate compression garments for long term management of lymphedema.    Period  Weeks    Status  On-going      CC Long Term Goal  #2   Title  Pt will be able to perform compression bandaging indpendently for long term managment of edema    Time  4    Period  Weeks    Status  On-going      CC Long Term Goal  #3   Title  Pt will report a 75% improvement in LLE swelling to decrease risk of infection.    Time  4    Period  Weeks    Status  On-going      CC Long Term Goal  #4   Title  Pt will demonstrate gross 5/5 strength in LLE to allow pt to return to PLOF    Baseline  4/5 grossly    Time  4    Period  Weeks    Status  On-going         Plan - 11/14/17 0849    Clinical Impression Statement  Pt is doing very well Active Phase  of treatment and her husband bandaging herself between appointments prn. She was measured for her compression garments Friday and they should arrive tomorrow. Pt also hopes to have pump demo this week.     Rehab Potential  Excellent    PT Frequency  3x / week    PT Duration  4 weeks    PT Treatment/Interventions  ADLs/Self Care Home Management;Therapeutic exercise;Patient/family education;Orthotic Fit/Training;Manual techniques;Manual lymph drainage;Compression bandaging;Vasopneumatic Device;Scar mobilization    PT Next Visit Plan  Cont with complete decongestive therapy until garments arrive. Assess garment once it arrives.    Consulted and Agree with Plan of Care  Patient       Patient will benefit from skilled therapeutic intervention in order to improve the following deficits and impairments:  Pain, Decreased strength, Increased edema  Visit Diagnosis: Lymphedema, not elsewhere classified  Muscle weakness (generalized)     Problem List Patient Active Problem List   Diagnosis Date Noted  . Genetic testing 09/27/2017  . Family history of brain cancer   . Family history of pancreatic cancer   . Family history of colon cancer   . Family history of ovarian cancer   . Family history of melanoma   . Malignant melanoma ([place])   . Family history of cancer 05/29/2013  . Essential hypertension 01/09/2009  . Dyslipidemia 10/09/2007    [name], PTA 11/14/2017, 8:52 AM  [place], [place], [address] Phone: [phone]   Fax:  [phone]  Name: Dawn [place] MRN: [id number] Date of Birth: [birth year]

## 2017-11-15 ENCOUNTER — Ambulatory Visit: Payer: BLUE CROSS/BLUE SHIELD

## 2017-11-15 DIAGNOSIS — I890 Lymphedema, not elsewhere classified: Secondary | ICD-10-CM

## 2017-11-15 DIAGNOSIS — M6281 Muscle weakness (generalized): Secondary | ICD-10-CM | POA: Diagnosis not present

## 2017-11-15 NOTE — Therapy (Signed)
[place], [place], [address] Phone: [phone]   Fax:  [phone]  Physical Therapy Treatment  Patient Details  Name: Dawn Cole MRN: [id number] Date of Birth: May 22, 1966 Referring Provider: Barry Dienes   Encounter Date: 11/15/2017  PT End of Session - 11/15/17 0850    Visit Number  10    Number of Visits  13    Date for PT Re-Evaluation  11/21/17    PT Start Time  0802    PT Stop Time  0848    PT Time Calculation (min)  46 min    Activity Tolerance  Patient tolerated treatment well    Behavior During Therapy  [facility] for tasks assessed/performed       Past Medical History:  Diagnosis Date  . 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 Family history of brain cancer   . Family history of colon cancer   . Family history of melanoma   . Family history of ovarian cancer   . Family history of pancreatic cancer   . HYPERLIPIDEMIA 10/09/2007  . HYPERTENSION 01/09/2009  . Malignant melanoma ([place])     Past Surgical History:  Procedure Laterality Date  . ABDOMINAL HYSTERECTOMY    . COLONOSCOPY  12/2016  . HYSTEROSCOPY    . LIPOMA EXCISION Left 08/02/2017   Procedure: WIDE LOCAL EXCISION WITH ADVANCEMENT FLAP CLOSURE LEFT LOWER LEG AND SENTINEL LYMPH NODE MAPPING AND BIOPOSY;  Surgeon: [name], MD;  Location: [place];  Service: General;  Laterality: Left;  GENERAL AND LOCAL    There were no vitals filed for this visit.  Subjective Assessment - 11/15/17 0805    Subjective  Nothing new since yesterday except I have my pump demo at work today.    Pertinent History  WLE/advanced flap closure and inguinal lymph SNLB 08/02/17 for malignant melanoma    Patient Stated Goals  to get my swelling down and get back to exercising    Currently in Pain?  No/denies            LYMPHEDEMA/ONCOLOGY QUESTIONNAIRE - 11/15/17 0814      Left Lower Extremity Lymphedema   30 cm Proximal to Suprapatella   68 cm    20 cm Proximal to Suprapatella  63.1 cm Bandage had slid right below this point    10 cm Proximal to Suprapatella  51.6 cm    At Midpatella/Popliteal Crease  39.3 cm    30 cm Proximal to Floor at Lateral Plantar Foot  38.4 cm    20 cm Proximal to Floor at Lateral Plantar Foot  30.8 cm    10 cm Proximal to Floor at Lateral Malleoli  22.6 cm    5 cm Proximal to 1st MTP Joint  20.8 cm    Across MTP Joint  20.5 cm    Around Proximal Great Toe  7.2 cm               [facility] Adult PT Treatment/Exercise - 11/15/17 0001      Manual Therapy   Manual Therapy  Compression Bandaging    Manual therapy comments  Circumference measurements taken    Compression Bandaging  to LLE: lotion applied then thin stockinette from foot to groin, elastomull to digits 1-3, 2 rolls of artiflex from foot to groin (1/4" gray foam at popliteal space due to redness here), 1- 6 cm, 1- 8 cm, 1- 10 cm bandage from foot to knee, then  4 -12 cm starting just below knee to groin. Lenkelast applied to proximal thigh to assist with keeping bandages up.                      Long Term Clinic Goals - 10/28/17 1306      CC Long Term Goal  #1   Title  Pt will receive appropriate compression garments for long term management of lymphedema.    Period  Weeks    Status  On-going      CC Long Term Goal  #2   Title  Pt will be able to perform compression bandaging indpendently for long term managment of edema    Time  4    Period  Weeks    Status  On-going      CC Long Term Goal  #3   Title  Pt will report a 75% improvement in LLE swelling to decrease risk of infection.    Time  4    Period  Weeks    Status  On-going      CC Long Term Goal  #4   Title  Pt will demonstrate gross 5/5 strength in LLE to allow pt to return to PLOF    Baseline  4/5 grossly    Time  4    Period  Weeks    Status  On-going         Plan - 11/15/17 0850    Clinical Impression Statement  Pts circumference  mesaurements all reduced except one which was upper thigh right above where her bandages had slid down from yesterday. Pts compression garment should arrive in next day or 2 and she is to wear this until her next apt Friday if it arrives.     Rehab Potential  Excellent    PT Frequency  3x / week    PT Duration  4 weeks    PT Treatment/Interventions  ADLs/Self Care Home Management;Therapeutic exercise;Patient/family education;Orthotic Fit/Training;Manual techniques;Manual lymph drainage;Compression bandaging;Vasopneumatic Device;Scar mobilization    PT Next Visit Plan  Cont with complete decongestive therapy until garments arrive. Assess garment once it arrives (ok with circular class 2 or flat knit class 1). Reduce frequency to have pt come back 1-2x if garments have arrived and appear to be a good fit.     Consulted and Agree with Plan of Care  Patient       Patient will benefit from skilled therapeutic intervention in order to improve the following deficits and impairments:  Pain, Decreased strength, Increased edema  Visit Diagnosis: Lymphedema, not elsewhere classified  Muscle weakness (generalized)     Problem List Patient Active Problem List   Diagnosis Date Noted  . Genetic testing 09/27/2017  . Family history of brain cancer   . Family history of pancreatic cancer   . Family history of colon cancer   . Family history of ovarian cancer   . Family history of melanoma   . Malignant melanoma ([place])   . Family history of cancer 05/29/2013  . Essential hypertension 01/09/2009  . Dyslipidemia 10/09/2007    Collie Siad Ann,PTA 11/15/2017, 8:53 AM  [place], [place], [address] Phone: [phone]   Fax:  [phone]  Name: CHONDRA BOYDE MRN: [id number] Date of Birth: 1966/02/27

## 2017-11-17 DIAGNOSIS — I890 Lymphedema, not elsewhere classified: Secondary | ICD-10-CM | POA: Diagnosis not present

## 2017-11-17 DIAGNOSIS — C439 Malignant melanoma of skin, unspecified: Secondary | ICD-10-CM | POA: Diagnosis not present

## 2017-11-18 ENCOUNTER — Encounter: Payer: Self-pay | Admitting: Physical Therapy

## 2017-11-18 ENCOUNTER — Ambulatory Visit: Payer: BLUE CROSS/BLUE SHIELD | Admitting: Physical Therapy

## 2017-11-18 DIAGNOSIS — I890 Lymphedema, not elsewhere classified: Secondary | ICD-10-CM | POA: Diagnosis not present

## 2017-11-18 DIAGNOSIS — M6281 Muscle weakness (generalized): Secondary | ICD-10-CM

## 2017-11-18 NOTE — Therapy (Signed)
[place], [place], [address] Phone: [phone]   Fax:  [phone]  Physical Therapy Treatment  Patient Details  Name: Dawn Cole MRN: [id number] Date of Birth: 1966-07-06 Referring Provider: Barry Dienes    Encounter Date: 11/18/2017  PT End of Session - 11/18/17 0848    Visit Number  11    Number of Visits  21    Date for PT Re-Evaluation  12/22/16    PT Start Time  0800    PT Stop Time  0843    PT Time Calculation (min)  43 min    Activity Tolerance  Patient tolerated treatment well    Behavior During Therapy  [facility] for tasks assessed/performed       Past Medical History:  Diagnosis Date  . Cancer (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 Family history of brain cancer   . Family history of colon cancer   . Family history of melanoma   . Family history of ovarian cancer   . Family history of pancreatic cancer   . HYPERLIPIDEMIA 10/09/2007  . HYPERTENSION 01/09/2009  . Malignant melanoma ([place])     Past Surgical History:  Procedure Laterality Date  . ABDOMINAL HYSTERECTOMY    . COLONOSCOPY  12/2016  . HYSTEROSCOPY    . LIPOMA EXCISION Left 08/02/2017   Procedure: WIDE LOCAL EXCISION WITH ADVANCEMENT FLAP CLOSURE LEFT LOWER LEG AND SENTINEL LYMPH NODE MAPPING AND BIOPOSY;  Surgeon: [name], MD;  Location: [place];  Service: General;  Laterality: Left;  GENERAL AND LOCAL    There were no vitals filed for this visit.  Subjective Assessment - 11/18/17 0806    Subjective  Pt comes in wearing her circulare thigh high.  She picked them up on Wednesday and wore them all day yesterday,  Wore nothing last night and put them back on this morning.  She got measured for her flat knit yesterday. She will have pump delivered next week     Pertinent History  WLE/advanced flap closure and inguinal lymph SNLB 08/02/17 for malignant melanoma    Patient Stated Goals  to get my swelling down and get back  to exercising. 11/18/2017  Pt says she has returned to exerciseing but at a lower level.     Currently in Pain?  No/denies         [facility] PT Assessment - 11/18/17 0001      Assessment   Medical Diagnosis  malignant melanoma    Referring Provider  [place]     Onset Date/Surgical Date  08/02/17      Prior Function   Level of Independence  Independent        LYMPHEDEMA/ONCOLOGY QUESTIONNAIRE - 11/18/17 0814      Left Lower Extremity Lymphedema   30 cm Proximal to Suprapatella  70 cm    20 cm Proximal to Suprapatella  62.5 cm    10 cm Proximal to Suprapatella  51.6 cm    At Midpatella/Popliteal Crease  39 cm    30 cm Proximal to Floor at Lateral Plantar Foot  38.4 cm    20 cm Proximal to Floor at Lateral Plantar Foot  29.7 cm    10 cm Proximal to Floor at Lateral Malleoli  22.2 cm    5 cm Proximal to 1st MTP Joint  20.8 cm    Across MTP Joint  20.5 cm    Around Proximal Great Toe  7.2 cm  [place] Adult PT Treatment/Exercise - 11/18/17 0001      Self-Care   Other Self-Care Comments   briefly discussed Night garments and will contact [employer] [facility] for recommendation for a tribute night or jovi pak that covers proximal thigh and lateral hip and a swell spot for firm area       Manual Therapy   Manual therapy comments  Circumference measurements taken    Manual Lymphatic Drainage (MLD)  In supine, short neck, superficial and deep abdominals, right inguinal nodes with anterior interinguinal anastamosis, left axillay nodes and left axillo-inguinal anastamosis, left thigh, especially upper thigh at firm area just distal to incision with extra time spent in this area. then to right sidelying for left lateral and posterior hip and posterior interinguinal anastamosis, then back to supine for another pass at left upper thight                     [place] - 11/18/17 1252      CC Long Term Goal  #1   Title  Pt will receive appropriate  compression garments for long term management of lymphedema.    Baseline  has circular knit, still waiting for flat knit and nighttime garment     Time  4    Period  Weeks    Status  On-going      CC Long Term Goal  #2   Title  Pt will be able to perform compression bandaging indpendently for long term managment of edema    Status  Achieved      CC Long Term Goal  #3   Title  Pt will report a 75% improvement in LLE swelling to decrease risk of infection.    Baseline  still has fullness in left upper medial thigh     Time  4    Period  Weeks    Status  On-going      CC Long Term Goal  #4   Title  Pt will demonstrate gross 5/5 strength in LLE to allow pt to return to PLOF    Baseline  4/5 grossly    Time  4    Period  Weeks    Status  On-going         Plan - 11/18/17 0849    Clinical Impression Statement  Pt has had further reduction with use of circular knit garment except for full spot at medial upper thigh just distal to incision. Pt reminded to make sure thigh high is pulled up high enough to cover this area.  She will also use chip pack to this area with garment and yoga pants at home. Feel she needs to continue with PT with concentration on manual lymph draiange along with compression garments to help reduce this area. Will send recert for 4 more weeks but can decrease to 2x per week     Rehab Potential  Excellent    PT Frequency  2x / week    PT Duration  4 weeks    PT Treatment/Interventions  ADLs/Self Care Home Management;Therapeutic exercise;Patient/family education;Orthotic Fit/Training;Manual techniques;Manual lymph drainage;Compression bandaging;Vasopneumatic Device;Scar mobilization       Patient will benefit from skilled therapeutic intervention in order to improve the following deficits and impairments:  Pain, Decreased strength, Increased edema  Visit Diagnosis: Lymphedema, not elsewhere classified - Plan: PT plan of care cert/re-cert  Muscle weakness  (generalized) - Plan: PT plan of care cert/re-cert     Problem  List Patient Active Problem List   Diagnosis Date Noted  . Genetic testing 09/27/2017  . Family history of brain cancer   . Family history of pancreatic cancer   . Family history of colon cancer   . Family history of ovarian cancer   . Family history of melanoma   . Malignant melanoma ([place])   . Family history of cancer 05/29/2013  . Essential hypertension 01/09/2009  . Dyslipidemia 10/09/2007   Donato Heinz. Owens Shark PT  Norwood Levo 11/18/2017, 12:56 PM  [place], [place], [address] Phone: [phone]   Fax:  [phone]  Name: Dawn Cole MRN: [id number] Date of Birth: 1966/12/12

## 2017-11-21 ENCOUNTER — Ambulatory Visit: Payer: BLUE CROSS/BLUE SHIELD | Attending: General Surgery | Admitting: Physical Therapy

## 2017-11-21 ENCOUNTER — Encounter: Payer: Self-pay | Admitting: Physical Therapy

## 2017-11-21 DIAGNOSIS — M6281 Muscle weakness (generalized): Secondary | ICD-10-CM | POA: Diagnosis not present

## 2017-11-21 DIAGNOSIS — I890 Lymphedema, not elsewhere classified: Secondary | ICD-10-CM | POA: Diagnosis not present

## 2017-11-21 NOTE — Therapy (Signed)
[place], [place], [address] Phone: [phone]   Fax:  [phone]  Physical Therapy Treatment  Patient Details  Name: Dawn Cole MRN: [id number] Date of Birth: 1966-07-28 Referring Provider: Barry Dienes    Encounter Date: 11/21/2017  PT End of Session - 11/21/17 1008    Visit Number  12    Number of Visits  21    Date for PT Re-Evaluation  12/22/16    PT Start Time  0803    PT Stop Time  0846    PT Time Calculation (min)  43 min    Activity Tolerance  Patient tolerated treatment well    Behavior During Therapy  [facility] for tasks assessed/performed       Past Medical History:  Diagnosis Date  . 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 Family history of brain cancer   . Family history of colon cancer   . Family history of melanoma   . Family history of ovarian cancer   . Family history of pancreatic cancer   . HYPERLIPIDEMIA 10/09/2007  . HYPERTENSION 01/09/2009  . Malignant melanoma ([employer])     Past Surgical History:  Procedure Laterality Date  . ABDOMINAL HYSTERECTOMY    . COLONOSCOPY  12/2016  . HYSTEROSCOPY    . LIPOMA EXCISION Left 08/02/2017   Procedure: WIDE LOCAL EXCISION WITH ADVANCEMENT FLAP CLOSURE LEFT LOWER LEG AND SENTINEL LYMPH NODE MAPPING AND BIOPOSY;  Surgeon: [name], MD;  Location: [place];  Service: General;  Laterality: Left;  GENERAL AND LOCAL    There were no vitals filed for this visit.  Subjective Assessment - 11/21/17 0803    Subjective  The pump is supposed to come tomorrow and if all goes well I will get the demonstration on Thursday. It seems like the area of tightness got more tight when I put the compression garment over it.     Pertinent History  WLE/advanced flap closure and inguinal lymph SNLB 08/02/17 for malignant melanoma    Patient Stated Goals  to get my swelling down and get back to exercising. 11/18/2017  Pt says she has returned to  exerciseing but at a lower level.     Currently in Pain?  No/denies    Multiple Pain Sites  No                      [facility] Adult PT Treatment/Exercise - 11/21/17 0001      Manual Therapy   Edema Management  pt re donned compression stocking following MLD    Manual Lymphatic Drainage (MLD)  In supine, short neck, superficial and deep abdominals, right inguinal nodes with anterior interinguinal anastamosis, left axillay nodes and left axillo-inguinal anastamosis, left thigh, especially upper thigh at firm area just distal to incision with extra time spent in this area. then to right sidelying for left lateral and posterior hip and posterior interinguinal anastamosis, then back to supine for another pass at left upper thight                     [place] - 11/18/17 1252      CC Long Term Goal  #1   Title  Pt will receive appropriate compression garments for long term management of lymphedema.    Baseline  has circular knit, still waiting for flat knit and nighttime garment     Time  4  Period  Weeks    Status  On-going      CC Long Term Goal  #2   Title  Pt will be able to perform compression bandaging indpendently for long term managment of edema    Status  Achieved      CC Long Term Goal  #3   Title  Pt will report a 75% improvement in LLE swelling to decrease risk of infection.    Baseline  still has fullness in left upper medial thigh     Time  4    Period  Weeks    Status  On-going      CC Long Term Goal  #4   Title  Pt will demonstrate gross 5/5 strength in LLE to allow pt to return to PLOF    Baseline  4/5 grossly    Time  4    Period  Weeks    Status  On-going         Plan - 11/21/17 1008    Clinical Impression Statement  Pt donned the band of her circular knit garment superior to area of swelling and tightness at upper medial thigh but she states this increased the fibrotic feeling. She was educated today to just wear the  band over that area like she had been since it seems to stay more soft when she does this. She has been sleeping with yoga pants on and chip pack in place. Pt should receive her FlexiTouch and demo this week.     Rehab Potential  Excellent    PT Frequency  2x / week    PT Duration  4 weeks    PT Treatment/Interventions  ADLs/Self Care Home Management;Therapeutic exercise;Patient/family education;Orthotic Fit/Training;Manual techniques;Manual lymph drainage;Compression bandaging;Vasopneumatic Device;Scar mobilization    PT Next Visit Plan  Cont with complete decongestive therapy until garments arrive. Assess garment once it arrives (ok with circular class 2 or flat knit class 1). Reduce frequency to have pt come back 1-2x if garments have arrived and appear to be a good fit.     PT Home Exercise Plan  facesheet sent to [facility] and Agree with Plan of Care  Patient       Patient will benefit from skilled therapeutic intervention in order to improve the following deficits and impairments:  Pain, Decreased strength, Increased edema  Visit Diagnosis: Lymphedema, not elsewhere classified     Problem List Patient Active Problem List   Diagnosis Date Noted  . Genetic testing 09/27/2017  . Family history of brain cancer   . Family history of pancreatic cancer   . Family history of colon cancer   . Family history of ovarian cancer   . Family history of melanoma   . Malignant melanoma ([place])   . Family history of cancer 05/29/2013  . Essential hypertension 01/09/2009  . Dyslipidemia 10/09/2007    Allyson Sabal [facility] 11/21/2017, 10:11 AM  [place], [place], [address] Phone: [phone]   Fax:  [phone]  Name: KYNADIE YAUN MRN: [id number] Date of Birth: Feb 01, 1966  Manus Gunning, PT 11/21/17 10:11 AM

## 2017-11-23 ENCOUNTER — Ambulatory Visit: Payer: BLUE CROSS/BLUE SHIELD | Admitting: Physical Therapy

## 2017-11-23 ENCOUNTER — Other Ambulatory Visit: Payer: Self-pay

## 2017-11-23 ENCOUNTER — Encounter: Payer: Self-pay | Admitting: Physical Therapy

## 2017-11-23 DIAGNOSIS — M6281 Muscle weakness (generalized): Secondary | ICD-10-CM | POA: Diagnosis not present

## 2017-11-23 DIAGNOSIS — I890 Lymphedema, not elsewhere classified: Secondary | ICD-10-CM | POA: Diagnosis not present

## 2017-11-23 NOTE — Therapy (Signed)
[place], [place], [address] Phone: [phone]   Fax:  [phone]  Physical Therapy Treatment  Patient Details  Name: Dawn Cole MRN: [id number] Date of Birth: 05-27-1966 Referring Provider: Barry Dienes    Encounter Date: 11/23/2017  PT End of Session - 11/23/17 0928    Visit Number  13    Number of Visits  21    Date for PT Re-Evaluation  12/22/16    PT Start Time  0845    PT Stop Time  0928    PT Time Calculation (min)  43 min    Behavior During Therapy  [facility] for tasks assessed/performed       Past Medical History:  Diagnosis Date  . 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 Family history of brain cancer   . Family history of colon cancer   . Family history of melanoma   . Family history of ovarian cancer   . Family history of pancreatic cancer   . HYPERLIPIDEMIA 10/09/2007  . HYPERTENSION 01/09/2009  . Malignant melanoma ([place])     Past Surgical History:  Procedure Laterality Date  . ABDOMINAL HYSTERECTOMY    . COLONOSCOPY  12/2016  . HYSTEROSCOPY    . LIPOMA EXCISION Left 08/02/2017   Procedure: WIDE LOCAL EXCISION WITH ADVANCEMENT FLAP CLOSURE LEFT LOWER LEG AND SENTINEL LYMPH NODE MAPPING AND BIOPOSY;  Surgeon: [name], MD;  Location: [place];  Service: General;  Laterality: Left;  GENERAL AND LOCAL    There were no vitals filed for this visit.  Subjective Assessment - 11/23/17 0846    Subjective  Pt states the pump was delivered yesterday and will get the demo tomorrow.  She says she the firm area at her upper thigh is pretty much the same.  She is wearing her circular knit during the day and nothing at night and her leg is stabalized . She will add bandaging at night if she feels that her legs is swelling more     Pertinent History  WLE/advanced flap closure and inguinal lymph SNLB 08/02/17 for malignant melanoma    Patient Stated Goals  to get my swelling down and get  back to exercising. 11/18/2017  Pt says she has returned to exerciseing but at a lower level.     Currently in Pain?  No/denies                      [facility] Adult PT Treatment/Exercise - 11/23/17 0001      Self-Care   Other Self-Care Comments   showed pt samples of solaris swell spots       Manual Therapy   Edema Management  pt re donned compression stocking following MLD    Manual Lymphatic Drainage (MLD)  In supine, short neck, superficial and deep abdominals, right inguinal nodes with anterior interinguinal anastamosis, left axillay nodes and left axillo-inguinal anastamosis, left thigh, especially upper thigh at firm area just distal to incision with extra time spent in this area. then to right sidelying for left lateral and posterior hip and posterior interinguinal anastamosis, then back to supine for another pass at left upper thight                     [place] - 11/18/17 1252      CC Long Term Goal  #1   Title  Pt will receive appropriate compression garments  for long term management of lymphedema.    Baseline  has circular knit, still waiting for flat knit and nighttime garment     Time  4    Period  Weeks    Status  On-going      CC Long Term Goal  #2   Title  Pt will be able to perform compression bandaging indpendently for long term managment of edema    Status  Achieved      CC Long Term Goal  #3   Title  Pt will report a 75% improvement in LLE swelling to decrease risk of infection.    Baseline  still has fullness in left upper medial thigh     Time  4    Period  Weeks    Status  On-going      CC Long Term Goal  #4   Title  Pt will demonstrate gross 5/5 strength in LLE to allow pt to return to PLOF    Baseline  4/5 grossly    Time  4    Period  Weeks    Status  On-going         Plan - 11/23/17 9371    Clinical Impression Statement  Pt has increased fullness in left thigh after a long car ride yesterday.  She feels a  little better after the MLD " it helps"  she is looking forward to getting flexi demo tomorrow     PT Treatment/Interventions  ADLs/Self Care Home Management;Therapeutic exercise;Patient/family education;Orthotic Fit/Training;Manual techniques;Manual lymph drainage;Compression bandaging;Vasopneumatic Device;Scar mobilization    PT Next Visit Plan  Cont with complete decongestive therapy until garments arrive. Assess garment once it arrives (ok with circular class 2 or flat knit class 1). Reduce frequency to have pt come back 1-2x if garments have arrived and appear to be a good fit.        Patient will benefit from skilled therapeutic intervention in order to improve the following deficits and impairments:     Visit Diagnosis: Lymphedema, not elsewhere classified  Muscle weakness (generalized)     Problem List Patient Active Problem List   Diagnosis Date Noted  . Genetic testing 09/27/2017  . Family history of brain cancer   . Family history of pancreatic cancer   . Family history of colon cancer   . Family history of ovarian cancer   . Family history of melanoma   . Malignant melanoma ([place])   . Family history of cancer 05/29/2013  . Essential hypertension 01/09/2009  . Dyslipidemia 10/09/2007   Donato Heinz. Owens Shark PT  Norwood Levo 11/23/2017, 9:31 AM  [place], [place], [address] Phone: [phone]   Fax:  [phone]  Name: Dawn Cole MRN: [id number] Date of Birth: [birth year]

## 2017-11-25 ENCOUNTER — Ambulatory Visit: Payer: BLUE CROSS/BLUE SHIELD

## 2017-11-25 DIAGNOSIS — M6281 Muscle weakness (generalized): Secondary | ICD-10-CM | POA: Diagnosis not present

## 2017-11-25 DIAGNOSIS — I890 Lymphedema, not elsewhere classified: Secondary | ICD-10-CM

## 2017-11-25 NOTE — Therapy (Signed)
[place], [place], [address] Phone: [phone]   Fax:  [phone]  Physical Therapy Treatment  Patient Details  Name: Dawn Cole MRN: [id number] Date of Birth: 01/12/1966 Referring Provider: Barry Dienes    Encounter Date: 11/25/2017  PT End of Session - 11/25/17 0848    Visit Number  14    Number of Visits  21    Date for PT Re-Evaluation  12/22/16    PT Start Time  0801    PT Stop Time  0849    PT Time Calculation (min)  48 min    Activity Tolerance  Patient tolerated treatment well    Behavior During Therapy  [facility] for tasks assessed/performed       Past Medical History:  Diagnosis Date  . 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 Family history of brain cancer   . Family history of colon cancer   . Family history of melanoma   . Family history of ovarian cancer   . Family history of pancreatic cancer   . HYPERLIPIDEMIA 10/09/2007  . HYPERTENSION 01/09/2009  . Malignant melanoma ([place])     Past Surgical History:  Procedure Laterality Date  . ABDOMINAL HYSTERECTOMY    . COLONOSCOPY  12/2016  . HYSTEROSCOPY    . LIPOMA EXCISION Left 08/02/2017   Procedure: WIDE LOCAL EXCISION WITH ADVANCEMENT FLAP CLOSURE LEFT LOWER LEG AND SENTINEL LYMPH NODE MAPPING AND BIOPOSY;  Surgeon: [name], MD;  Location: [place];  Service: General;  Laterality: Left;  GENERAL AND LOCAL    There were no vitals filed for this visit.  Subjective Assessment - 11/25/17 0809    Subjective  Got my pump set up yesterday with the rep, I love it. I can already tell a small improvement in the swelling near my groin from that and the garment. I'm debating on the nighttime garment bc it will be alot out of pocket.    Pertinent History  WLE/advanced flap closure and inguinal lymph SNLB 08/02/17 for malignant melanoma    Patient Stated Goals  to get my swelling down and get back to exercising. 11/18/2017  Pt says she  has returned to exerciseing but at a lower level.     Currently in Pain?  No/denies                      [facility] Adult PT Treatment/Exercise - 11/25/17 0001      Self-Care   Other Self-Care Comments   Showed pt options of nighttime garment that we were considering for her and pt agrees with the hip hugger Jovi.       Manual Therapy   Manual Lymphatic Drainage (MLD)  In supine, short neck, superficial and deep abdominals, right inguinal nodes with anterior interinguinal anastamosis, left axillay nodes and left axillo-inguinal anastamosis, left thigh, especially upper thigh at firm area just distal to incision with extra time spent in this area.             PT Education - 11/25/17 0853    Education provided  Yes    Education Details  Issued handout with picture of hip huger nighttime garment    Person(s) Educated  Patient    Methods  Explanation;Handout    Comprehension  Verbalized understanding             [place] - 11/18/17 1252      CC  Long Term Goal  #1   Title  Pt will receive appropriate compression garments for long term management of lymphedema.    Baseline  has circular knit, still waiting for flat knit and nighttime garment     Time  4    Period  Weeks    Status  On-going      CC Long Term Goal  #2   Title  Pt will be able to perform compression bandaging indpendently for long term managment of edema    Status  Achieved      CC Long Term Goal  #3   Title  Pt will report a 75% improvement in LLE swelling to decrease risk of infection.    Baseline  still has fullness in left upper medial thigh     Time  4    Period  Weeks    Status  On-going      CC Long Term Goal  #4   Title  Pt will demonstrate gross 5/5 strength in LLE to allow pt to return to PLOF    Baseline  4/5 grossly    Time  4    Period  Weeks    Status  On-going         Plan - 11/25/17 0850    Clinical Impression Statement  Was able to focus on manual  lymph drainage today for duration of session as pt has her compressionn garments to wear that are well fitting. She still plans to order another flat knit but overall is very happy with garments. Also got the pump set up yesterday and she was able to use it last night and already can tell small iprovement at her Lt upper medial thigh. Palpably this was softer today as well and noticably smaller than when this therapist saw her last a week ago. Pt is considering nighttime garment (hip hugger Jovi) but may wait a few months as it will be more put of pocket (~700) to be ordered.     Rehab Potential  Excellent    PT Frequency  2x / week    PT Treatment/Interventions  ADLs/Self Care Home Management;Therapeutic exercise;Patient/family education;Orthotic Fit/Training;Manual techniques;Manual lymph drainage;Compression bandaging;Vasopneumatic Device;Scar mobilization    PT Next Visit Plan  Cont with manual lymph drainage and assesing circumference. Reduce frequency to have pt come back 1-2x if garments have arrived and appear to be a good fit.     Consulted and Agree with Plan of Care  Patient       Patient will benefit from skilled therapeutic intervention in order to improve the following deficits and impairments:  Pain, Decreased strength, Increased edema  Visit Diagnosis: Lymphedema, not elsewhere classified     Problem List Patient Active Problem List   Diagnosis Date Noted  . Genetic testing 09/27/2017  . Family history of brain cancer   . Family history of pancreatic cancer   . Family history of colon cancer   . Family history of ovarian cancer   . Family history of melanoma   . Malignant melanoma ([place])   . Family history of cancer 05/29/2013  . Essential hypertension 01/09/2009  . Dyslipidemia 10/09/2007    [name], PTA 11/25/2017, 8:54 AM  [place], [place], [address] Phone:  [phone]   Fax:  [phone]  Name: Dawn Cole MRN: [id number] Date of Birth: 04-19-1966

## 2017-12-01 ENCOUNTER — Ambulatory Visit: Payer: BLUE CROSS/BLUE SHIELD

## 2017-12-01 DIAGNOSIS — I890 Lymphedema, not elsewhere classified: Secondary | ICD-10-CM | POA: Diagnosis not present

## 2017-12-01 DIAGNOSIS — M6281 Muscle weakness (generalized): Secondary | ICD-10-CM | POA: Diagnosis not present

## 2017-12-01 DIAGNOSIS — C439 Malignant melanoma of skin, unspecified: Secondary | ICD-10-CM | POA: Diagnosis not present

## 2017-12-01 NOTE — Therapy (Signed)
[place], [place], [address] Phone: [phone]   Fax:  [phone]  Physical Therapy Treatment  Patient Details  Name: Dawn Cole MRN: [id number] Date of Birth: 1966/04/03 Referring Provider: Barry Dienes    Encounter Date: 12/01/2017  PT End of Session - 12/01/17 0850    Visit Number  15    Number of Visits  21    Date for PT Re-Evaluation  12/22/16    PT Start Time  0801    PT Stop Time  0850    PT Time Calculation (min)  49 min    Activity Tolerance  Patient tolerated treatment well    Behavior During Therapy  [facility] for tasks assessed/performed       Past Medical History:  Diagnosis Date  . 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 Family history of brain cancer   . Family history of colon cancer   . Family history of melanoma   . Family history of ovarian cancer   . Family history of pancreatic cancer   . HYPERLIPIDEMIA 10/09/2007  . HYPERTENSION 01/09/2009  . Malignant melanoma ([place])     Past Surgical History:  Procedure Laterality Date  . ABDOMINAL HYSTERECTOMY    . COLONOSCOPY  12/2016  . HYSTEROSCOPY    . LIPOMA EXCISION Left 08/02/2017   Procedure: WIDE LOCAL EXCISION WITH ADVANCEMENT FLAP CLOSURE LEFT LOWER LEG AND SENTINEL LYMPH NODE MAPPING AND BIOPOSY;  Surgeon: [name], MD;  Location: [facility];  Service: General;  Laterality: Left;  GENERAL AND LOCAL    There were no vitals filed for this visit.  Subjective Assessment - 12/01/17 0804    Subjective  The area at my inner upper thigh is still there but it is softer and that has stayed improved. I've been using the pump daily and wearing my garments. Everything is going well.     Pertinent History  WLE/advanced flap closure and inguinal lymph SNLB 08/02/17 for malignant melanoma    Patient Stated Goals  to get my swelling down and get back to exercising. 11/18/2017  Pt says she has returned to exerciseing but at a lower  level.     Currently in Pain?  No/denies            LYMPHEDEMA/ONCOLOGY QUESTIONNAIRE - 12/01/17 0805      Left Lower Extremity Lymphedema   30 cm Proximal to Suprapatella  67.8 cm    20 cm Proximal to Suprapatella  61.9 cm    10 cm Proximal to Suprapatella  52.2 cm    At Midpatella/Popliteal Crease  39.2 cm    30 cm Proximal to Floor at Lateral Plantar Foot  39.8 cm    20 cm Proximal to Floor at Lateral Plantar Foot  30 cm    10 cm Proximal to Floor at Lateral Malleoli  22.1 cm    5 cm Proximal to 1st MTP Joint  20.5 cm    Across MTP Joint  20.9 cm    Around Proximal Great Toe  7.4 cm               [facility] Adult PT Treatment/Exercise - 12/01/17 0001      Manual Therapy   Manual therapy comments  Circumference measurements    Manual Lymphatic Drainage (MLD)  In supine, short neck, superficial and deep abdominals, right inguinal nodes with anterior interinguinal anastamosis, left axillay nodes and left axillo-inguinal anastamosis, left thigh, especially  upper thigh at firm area just distal to incision with extra time spent in this area.                     [facility] Goals - 11/18/17 1252      CC Long Term Goal  #1   Title  Pt will receive appropriate compression garments for long term management of lymphedema.    Baseline  has circular knit, still waiting for flat knit and nighttime garment     Time  4    Period  Weeks    Status  On-going      CC Long Term Goal  #2   Title  Pt will be able to perform compression bandaging indpendently for long term managment of edema    Status  Achieved      CC Long Term Goal  #3   Title  Pt will report a 75% improvement in LLE swelling to decrease risk of infection.    Baseline  still has fullness in left upper medial thigh     Time  4    Period  Weeks    Status  On-going      CC Long Term Goal  #4   Title  Pt will demonstrate gross 5/5 strength in LLE to allow pt to return to PLOF    Baseline  4/5  grossly    Time  4    Period  Weeks    Status  On-going         Plan - 12/01/17 0850    Clinical Impression Statement  Continued to focus on manual lymph drainage to help pt to attain further reductions at upper thigh. Her circumference measurements had increased some at some areas but pt reported was more active yesterday and feels this contributed to that.     Rehab Potential  Excellent    PT Frequency  2x / week    PT Duration  4 weeks    PT Treatment/Interventions  ADLs/Self Care Home Management;Therapeutic exercise;Patient/family education;Orthotic Fit/Training;Manual techniques;Manual lymph drainage;Compression bandaging;Vasopneumatic Device;Scar mobilization    PT Next Visit Plan  Cont with manual lymph drainage and assesing circumference. Reduce frequency to have pt come back 1-2x if garments have arrived and appear to be a good fit.     Consulted and Agree with Plan of Care  Patient       Patient will benefit from skilled therapeutic intervention in order to improve the following deficits and impairments:  Pain, Decreased strength, Increased edema  Visit Diagnosis: Lymphedema, not elsewhere classified     Problem List Patient Active Problem List   Diagnosis Date Noted  . Genetic testing 09/27/2017  . Family history of brain cancer   . Family history of pancreatic cancer   . Family history of colon cancer   . Family history of ovarian cancer   . Family history of melanoma   . Malignant melanoma ([place])   . Family history of cancer 05/29/2013  . Essential hypertension 01/09/2009  . Dyslipidemia 10/09/2007    [name], PTA 12/01/2017, 8:54 AM  [place], [place], [address] Phone: [phone]   Fax:  [phone]  Name: Dawn Cole MRN: [id number] Date of Birth: 01-Aug-1966

## 2017-12-02 ENCOUNTER — Encounter: Payer: BLUE CROSS/BLUE SHIELD | Admitting: Physical Therapy

## 2017-12-05 DIAGNOSIS — L814 Other melanin hyperpigmentation: Secondary | ICD-10-CM | POA: Diagnosis not present

## 2017-12-05 DIAGNOSIS — D1801 Hemangioma of skin and subcutaneous tissue: Secondary | ICD-10-CM | POA: Diagnosis not present

## 2017-12-05 DIAGNOSIS — Z85828 Personal history of other malignant neoplasm of skin: Secondary | ICD-10-CM | POA: Diagnosis not present

## 2017-12-05 DIAGNOSIS — Z85820 Personal history of malignant melanoma of skin: Secondary | ICD-10-CM | POA: Diagnosis not present

## 2017-12-06 ENCOUNTER — Ambulatory Visit: Payer: BLUE CROSS/BLUE SHIELD

## 2017-12-06 DIAGNOSIS — I890 Lymphedema, not elsewhere classified: Secondary | ICD-10-CM

## 2017-12-06 DIAGNOSIS — M6281 Muscle weakness (generalized): Secondary | ICD-10-CM | POA: Diagnosis not present

## 2017-12-06 NOTE — Therapy (Signed)
[place], [place], [address] Phone: [phone]   Fax:  [phone]  Physical Therapy Treatment  Patient Details  Name: Dawn Cole MRN: [id number] Date of Birth: [birth year] Referring Provider: Barry Dienes    Encounter Date: 12/06/2017  PT End of Session - 12/06/17 0849    Visit Number  16    Number of Visits  21    Date for PT Re-Evaluation  12/22/16    PT Start Time  0807 Pt arrived a few mins late    PT Stop Time  0849    PT Time Calculation (min)  42 min    Activity Tolerance  Patient tolerated treatment well    Behavior During Therapy  [facility] for tasks assessed/performed       Past Medical History:  Diagnosis Date  . 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 Family history of brain cancer   . Family history of colon cancer   . Family history of melanoma   . Family history of ovarian cancer   . Family history of pancreatic cancer   . HYPERLIPIDEMIA 10/09/2007  . HYPERTENSION 01/09/2009  . Malignant melanoma ([place])     Past Surgical History:  Procedure Laterality Date  . ABDOMINAL HYSTERECTOMY    . COLONOSCOPY  12/2016  . HYSTEROSCOPY    . LIPOMA EXCISION Left 08/02/2017   Procedure: WIDE LOCAL EXCISION WITH ADVANCEMENT FLAP CLOSURE LEFT LOWER LEG AND SENTINEL LYMPH NODE MAPPING AND BIOPOSY;  Surgeon: [name], MD;  Location: [facility];  Service: General;  Laterality: Left;  GENERAL AND LOCAL    There were no vitals filed for this visit.  Subjective Assessment - 12/06/17 0811    Subjective  I'm maintaining my swelling well overall I think. I went ahead and ordered my nighttime and another daytime garment so I think I'm doing good with getting everything I'll need.     Pertinent History  WLE/advanced flap closure and inguinal lymph SNLB 08/02/17 for malignant melanoma    Patient Stated Goals  to get my swelling down and get back to exercising. 11/18/2017  Pt says she has returned to  exerciseing but at a lower level.     Currently in Pain?  No/denies                      [facility] Adult PT Treatment/Exercise - 12/06/17 0001      Manual Therapy   Manual therapy comments  Circumference measurements    Manual Lymphatic Drainage (MLD)  In supine, short neck, superficial and deep abdominals, right inguinal nodes with anterior interinguinal anastamosis, left axillay nodes and left axillo-inguinal anastamosis, left thigh, especially upper thigh at firm area just distal to incision with extra time spent in this area.                     [facility] Goals - 11/18/17 1252      CC Long Term Goal  #1   Title  Pt will receive appropriate compression garments for long term management of lymphedema.    Baseline  has circular knit, still waiting for flat knit and nighttime garment     Time  4    Period  Weeks    Status  On-going      CC Long Term Goal  #2   Title  Pt will be able to perform compression bandaging indpendently for long term  managment of edema    Status  Achieved      CC Long Term Goal  #3   Title  Pt will report a 75% improvement in LLE swelling to decrease risk of infection.    Baseline  still has fullness in left upper medial thigh     Time  4    Period  Weeks    Status  On-going      CC Long Term Goal  #4   Title  Pt will demonstrate gross 5/5 strength in LLE to allow pt to return to PLOF    Baseline  4/5 grossly    Time  4    Period  Weeks    Status  On-going         Plan - 12/06/17 0850    Clinical Impression Statement  Pt continues to report doing well with maintaining her circumference with use of her garments and compression pump at home. Her swelling at Lt upper medial thigh continues to feel softened. Overall pt conts to make great progress towards the Maintenance Phase of treatment.     Rehab Potential  Excellent    PT Frequency  2x / week    PT Duration  4 weeks    PT Treatment/Interventions  ADLs/Self Care  Home Management;Therapeutic exercise;Patient/family education;Orthotic Fit/Training;Manual techniques;Manual lymph drainage;Compression bandaging;Vasopneumatic Device;Scar mobilization    PT Next Visit Plan  Measure circumference; Cont with manual lymph drainage and assesing circumference.     Consulted and Agree with Plan of Care  Patient       Patient will benefit from skilled therapeutic intervention in order to improve the following deficits and impairments:  Pain, Decreased strength, Increased edema  Visit Diagnosis: Lymphedema, not elsewhere classified     Problem List Patient Active Problem List   Diagnosis Date Noted  . Genetic testing 09/27/2017  . Family history of brain cancer   . Family history of pancreatic cancer   . Family history of colon cancer   . Family history of ovarian cancer   . Family history of melanoma   . Malignant melanoma ([place])   . Family history of cancer 05/29/2013  . Essential hypertension 01/09/2009  . Dyslipidemia 10/09/2007    [name], PTA 12/06/2017, 8:52 AM  [place], [place], [address] Phone: [phone]   Fax:  [phone]  Name: MARAM BENTLY MRN: [id number] Date of Birth: 04/29/1966

## 2017-12-08 DIAGNOSIS — C439 Malignant melanoma of skin, unspecified: Secondary | ICD-10-CM | POA: Diagnosis not present

## 2017-12-09 ENCOUNTER — Encounter: Payer: Self-pay | Admitting: Physical Therapy

## 2017-12-09 ENCOUNTER — Ambulatory Visit: Payer: BLUE CROSS/BLUE SHIELD | Admitting: Physical Therapy

## 2017-12-09 DIAGNOSIS — I890 Lymphedema, not elsewhere classified: Secondary | ICD-10-CM

## 2017-12-09 DIAGNOSIS — M6281 Muscle weakness (generalized): Secondary | ICD-10-CM | POA: Diagnosis not present

## 2017-12-09 DIAGNOSIS — C439 Malignant melanoma of skin, unspecified: Secondary | ICD-10-CM | POA: Diagnosis not present

## 2017-12-09 NOTE — Therapy (Signed)
[place], [place], [address] Phone: [phone]   Fax:  [phone]  Physical Therapy Treatment  Patient Details  Name: Dawn Cole MRN: [id number] Date of Birth: 1966-11-01 Referring Provider: Barry Dienes    Encounter Date: 12/09/2017  PT End of Session - 12/09/17 0849    Visit Number  17    Number of Visits  21    Date for PT Re-Evaluation  12/22/16    PT Start Time  0803    PT Stop Time  0846    PT Time Calculation (min)  43 min    Activity Tolerance  Patient tolerated treatment well    Behavior During Therapy  [facility] for tasks assessed/performed       Past Medical History:  Diagnosis Date  . 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 Family history of brain cancer   . Family history of colon cancer   . Family history of melanoma   . Family history of ovarian cancer   . Family history of pancreatic cancer   . HYPERLIPIDEMIA 10/09/2007  . HYPERTENSION 01/09/2009  . Malignant melanoma ([place])     Past Surgical History:  Procedure Laterality Date  . ABDOMINAL HYSTERECTOMY    . COLONOSCOPY  12/2016  . HYSTEROSCOPY    . LIPOMA EXCISION Left 08/02/2017   Procedure: WIDE LOCAL EXCISION WITH ADVANCEMENT FLAP CLOSURE LEFT LOWER LEG AND SENTINEL LYMPH NODE MAPPING AND BIOPOSY;  Surgeon: [name], MD;  Location: [facility];  Service: General;  Laterality: Left;  GENERAL AND LOCAL    There were no vitals filed for this visit.  Subjective Assessment - 12/09/17 0806    Subjective  I ordered the flat knit day time garment and the night time garment. I picked up a closed toe circular yesterday. My leg is doing pretty good.     Pertinent History  WLE/advanced flap closure and inguinal lymph SNLB 08/02/17 for malignant melanoma    Patient Stated Goals  to get my swelling down and get back to exercising. 11/18/2017  Pt says she has returned to exerciseing but at a lower level.     Currently in Pain?   No/denies            LYMPHEDEMA/ONCOLOGY QUESTIONNAIRE - 12/09/17 0807      Left Lower Extremity Lymphedema   30 cm Proximal to Suprapatella  66.5 cm    20 cm Proximal to Suprapatella  60.5 cm    10 cm Proximal to Suprapatella  53 cm    At Midpatella/Popliteal Crease  40 cm    30 cm Proximal to Floor at Lateral Plantar Foot  41.7 cm    20 cm Proximal to Floor at Lateral Plantar Foot  32.5 cm    10 cm Proximal to Floor at Lateral Malleoli  24.5 cm    5 cm Proximal to 1st MTP Joint  20.6 cm    Across MTP Joint  21 cm    Around Proximal Great Toe  7.5 cm               [facility] Adult PT Treatment/Exercise - 12/09/17 0001      Manual Therapy   Manual therapy comments  Circumference measurements    Manual Lymphatic Drainage (MLD)  In supine, short neck, superficial and deep abdominals, right inguinal nodes with anterior interinguinal anastamosis, left axillay nodes and left axillo-inguinal anastamosis, left thigh, especially upper thigh at firm area just  distal to incision with extra time spent in this area.                     [facility] Goals - 11/18/17 1252      CC Long Term Goal  #1   Title  Pt will receive appropriate compression garments for long term management of lymphedema.    Baseline  has circular knit, still waiting for flat knit and nighttime garment     Time  4    Period  Weeks    Status  On-going      CC Long Term Goal  #2   Title  Pt will be able to perform compression bandaging indpendently for long term managment of edema    Status  Achieved      CC Long Term Goal  #3   Title  Pt will report a 75% improvement in LLE swelling to decrease risk of infection.    Baseline  still has fullness in left upper medial thigh     Time  4    Period  Weeks    Status  On-going      CC Long Term Goal  #4   Title  Pt will demonstrate gross 5/5 strength in LLE to allow pt to return to PLOF    Baseline  4/5 grossly    Time  4    Period  Weeks     Status  On-going         Plan - 12/09/17 0850    Clinical Impression Statement  Pt demonstrates reduction of thigh but some increase in swelling in lower leg. She reports her circular garment got wet so she took it off a little earlier last night and did not reapply until the end of the session today which could be why her lower leg measurements are elevated. Pt is awaiting arrival of flat knit garment and night time garment.     Rehab Potential  Excellent    PT Frequency  2x / week    PT Duration  4 weeks    PT Treatment/Interventions  ADLs/Self Care Home Management;Therapeutic exercise;Patient/family education;Orthotic Fit/Training;Manual techniques;Manual lymph drainage;Compression bandaging;Vasopneumatic Device;Scar mobilization    PT Next Visit Plan  After this cert is up recert pt at 1x/wk for 4 weeks to be able to assess new garments once they arrive, Measure circumference; Cont with manual lymph drainage and assesing circumference.     Consulted and Agree with Plan of Care  Patient       Patient will benefit from skilled therapeutic intervention in order to improve the following deficits and impairments:  Pain, Decreased strength, Increased edema  Visit Diagnosis: Lymphedema, not elsewhere classified     Problem List Patient Active Problem List   Diagnosis Date Noted  . Genetic testing 09/27/2017  . Family history of brain cancer   . Family history of pancreatic cancer   . Family history of colon cancer   . Family history of ovarian cancer   . Family history of melanoma   . Malignant melanoma ([place])   . Family history of cancer 05/29/2013  . Essential hypertension 01/09/2009  . Dyslipidemia 10/09/2007    Allyson Sabal [facility] 12/09/2017, 8:52 AM  [place], [place], [address] Phone: [phone]   Fax:  [phone]  Name: Dawn Cole MRN: [id number] Date of Birth:  03-07-1966  Manus Gunning, PT 12/09/17 8:52 AM

## 2017-12-15 ENCOUNTER — Ambulatory Visit: Payer: BLUE CROSS/BLUE SHIELD

## 2017-12-15 DIAGNOSIS — I890 Lymphedema, not elsewhere classified: Secondary | ICD-10-CM

## 2017-12-15 DIAGNOSIS — M6281 Muscle weakness (generalized): Secondary | ICD-10-CM | POA: Diagnosis not present

## 2017-12-15 NOTE — Therapy (Signed)
[place], [place], [address] Phone: [phone]   Fax:  [phone]  Physical Therapy Treatment  Patient Details  Name: Dawn Cole MRN: [id number] Date of Birth: 01/17/1966 Referring Provider: Barry Dienes    Encounter Date: 12/15/2017  PT End of Session - 12/15/17 0850    Visit Number  18    Number of Visits  21    Date for PT Re-Evaluation  12/22/16    PT Start Time  0802    PT Stop Time  0850    PT Time Calculation (min)  48 min    Activity Tolerance  Patient tolerated treatment well    Behavior During Therapy  [facility] for tasks assessed/performed       Past Medical History:  Diagnosis Date  . 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 Family history of brain cancer   . Family history of colon cancer   . Family history of melanoma   . Family history of ovarian cancer   . Family history of pancreatic cancer   . HYPERLIPIDEMIA 10/09/2007  . HYPERTENSION 01/09/2009  . Malignant melanoma ([place])     Past Surgical History:  Procedure Laterality Date  . ABDOMINAL HYSTERECTOMY    . COLONOSCOPY  12/2016  . HYSTEROSCOPY    . LIPOMA EXCISION Left 08/02/2017   Procedure: WIDE LOCAL EXCISION WITH ADVANCEMENT FLAP CLOSURE LEFT LOWER LEG AND SENTINEL LYMPH NODE MAPPING AND BIOPOSY;  Surgeon: [name], MD;  Location: [facility];  Service: General;  Laterality: Left;  GENERAL AND LOCAL    There were no vitals filed for this visit.  Subjective Assessment - 12/15/17 0806    Subjective  My other new garments should be in next week. Overall my leg is doing well.     Pertinent History  WLE/advanced flap closure and inguinal lymph SNLB 08/02/17 for malignant melanoma    Patient Stated Goals  to get my swelling down and get back to exercising. 11/18/2017  Pt says she has returned to exerciseing but at a lower level.     Currently in Pain?  No/denies            LYMPHEDEMA/ONCOLOGY QUESTIONNAIRE -  12/15/17 0807      Left Lower Extremity Lymphedema   30 cm Proximal to Suprapatella  69.1 cm    20 cm Proximal to Suprapatella  62.6 cm    10 cm Proximal to Suprapatella  53.1 cm    At Midpatella/Popliteal Crease  40.7 cm    30 cm Proximal to Floor at Lateral Plantar Foot  40.9 cm    20 cm Proximal to Floor at Lateral Plantar Foot  31.4 cm    10 cm Proximal to Floor at Lateral Malleoli  22.9 cm    5 cm Proximal to 1st MTP Joint  21 cm    Across MTP Joint  20.8 cm    Around Proximal Great Toe  7.4 cm               [facility] Adult PT Treatment/Exercise - 12/15/17 0001      Manual Therapy   Manual therapy comments  Circumference measurements    Manual Lymphatic Drainage (MLD)  In supine, short neck, superficial and deep abdominals, right inguinal nodes with anterior interinguinal anastamosis, left axillay nodes and left axillo-inguinal anastamosis, left thigh, especially upper thigh at firm area just distal to incision with extra time spent in this area.  [facility] Goals - 11/18/17 1252      CC Long Term Goal  #1   Title  Pt will receive appropriate compression garments for long term management of lymphedema.    Baseline  has circular knit, still waiting for flat knit and nighttime garment     Time  4    Period  Weeks    Status  On-going      CC Long Term Goal  #2   Title  Pt will be able to perform compression bandaging indpendently for long term managment of edema    Status  Achieved      CC Long Term Goal  #3   Title  Pt will report a 75% improvement in LLE swelling to decrease risk of infection.    Baseline  still has fullness in left upper medial thigh     Time  4    Period  Weeks    Status  On-going      CC Long Term Goal  #4   Title  Pt will demonstrate gross 5/5 strength in LLE to allow pt to return to PLOF    Baseline  4/5 grossly    Time  4    Period  Weeks    Status  On-going         Plan - 12/15/17 8676     Clinical Impression Statement  Pt had some increased fullness at her medial upper thigh so focused on here for large part of time with manual lymph drainage. Also assisted her with donning garment after to help evenly spread out garment over leg. Showed and used gloves which pt liked and reported would get rubber kitchen gloves to help herself at home with donning.     Rehab Potential  Excellent    PT Frequency  2x / week    PT Duration  4 weeks    PT Treatment/Interventions  ADLs/Self Care Home Management;Therapeutic exercise;Patient/family education;Orthotic Fit/Training;Manual techniques;Manual lymph drainage;Compression bandaging;Vasopneumatic Device;Scar mobilization    PT Next Visit Plan  After this cert is up recert pt at 1x/wk for 4 weeks to be able to assess new garments once they arrive, Measure circumference; Cont with manual lymph drainage and assesing circumference.     Consulted and Agree with Plan of Care  Patient       Patient will benefit from skilled therapeutic intervention in order to improve the following deficits and impairments:  Pain, Decreased strength, Increased edema  Visit Diagnosis: Lymphedema, not elsewhere classified     Problem List Patient Active Problem List   Diagnosis Date Noted  . Genetic testing 09/27/2017  . Family history of brain cancer   . Family history of pancreatic cancer   . Family history of colon cancer   . Family history of ovarian cancer   . Family history of melanoma   . Malignant melanoma ([place])   . Family history of cancer 05/29/2013  . Essential hypertension 01/09/2009  . Dyslipidemia 10/09/2007    [name], PTA 12/15/2017, 8:54 AM  [place], [place], [address] Phone: [phone]   Fax:  [phone]  Name: Dawn Cole MRN: [id number] Date of Birth: [birth year]

## 2017-12-16 ENCOUNTER — Ambulatory Visit: Payer: BLUE CROSS/BLUE SHIELD | Admitting: Physical Therapy

## 2017-12-17 DIAGNOSIS — I890 Lymphedema, not elsewhere classified: Secondary | ICD-10-CM | POA: Diagnosis not present

## 2017-12-19 ENCOUNTER — Ambulatory Visit: Payer: BLUE CROSS/BLUE SHIELD

## 2017-12-19 DIAGNOSIS — I890 Lymphedema, not elsewhere classified: Secondary | ICD-10-CM | POA: Diagnosis not present

## 2017-12-19 DIAGNOSIS — M6281 Muscle weakness (generalized): Secondary | ICD-10-CM | POA: Diagnosis not present

## 2017-12-19 NOTE — Therapy (Signed)
[place], [place], [address] Phone: [phone]   Fax:  [phone]  Physical Therapy Treatment  Patient Details  Name: WISDOM SEYBOLD MRN: [id number] Date of Birth: [birth year] Referring Provider: Barry Dienes    Encounter Date: 12/19/2017  PT End of Session - 12/19/17 0847    Visit Number  19    Number of Visits  21    Date for PT Re-Evaluation  12/22/16    PT Start Time  0801    PT Stop Time  1572    PT Time Calculation (min)  46 min    Activity Tolerance  Patient tolerated treatment well    Behavior During Therapy  [facility] for tasks assessed/performed       Past Medical History:  Diagnosis Date  . 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 Family history of brain cancer   . Family history of colon cancer   . Family history of melanoma   . Family history of ovarian cancer   . Family history of pancreatic cancer   . HYPERLIPIDEMIA 10/09/2007  . HYPERTENSION 01/09/2009  . Malignant melanoma ([place])     Past Surgical History:  Procedure Laterality Date  . ABDOMINAL HYSTERECTOMY    . COLONOSCOPY  12/2016  . HYSTEROSCOPY    . LIPOMA EXCISION Left 08/02/2017   Procedure: WIDE LOCAL EXCISION WITH ADVANCEMENT FLAP CLOSURE LEFT LOWER LEG AND SENTINEL LYMPH NODE MAPPING AND BIOPOSY;  Surgeon: [name], MD;  Location: [facility];  Service: General;  Laterality: Left;  GENERAL AND LOCAL    There were no vitals filed for this visit.  Subjective Assessment - 12/19/17 0805    Subjective  Nothing new since I was here last. Wearing my circular knit garments daily. My flat knit onmes should be arriving this week.     Pertinent History  WLE/advanced flap closure and inguinal lymph SNLB 08/02/17 for malignant melanoma    Patient Stated Goals  to get my swelling down and get back to exercising. 11/18/2017  Pt says she has returned to exerciseing but at a lower level.     Currently in Pain?  No/denies                       [facility] Adult PT Treatment/Exercise - 12/19/17 0001      Manual Therapy   Manual Lymphatic Drainage (MLD)  In supine, short neck, superficial and deep abdominals, right inguinal nodes with anterior interinguinal anastamosis, left axillay nodes and left axillo-inguinal anastamosis, left thigh, especially upper thigh at firm area just distal to incision with extra time spent in this area.                     [facility] Goals - 12/19/17 0849      CC Long Term Goal  #1   Title  Pt will receive appropriate compression garments for long term management of lymphedema.    Baseline  has circular knit, still waiting for flat knit and nighttime garment     Status  Partially Met      CC Long Term Goal  #2   Title  Pt will be able to perform compression bandaging indpendently for long term managment of edema    Status  Achieved      CC Long Term Goal  #3   Title  Pt will report a 75% improvement in LLE swelling to  decrease risk of infection.    Baseline  still has fullness in left upper medial thigh     Status  On-going      CC Long Term Goal  #4   Title  Pt will demonstrate gross 5/5 strength in LLE to allow pt to return to PLOF    Status  On-going         Plan - 12/19/17 0847    Clinical Impression Statement  Pt continues with mod increased fluid at Lt medial upper thigh near groin which softens with manual lymph drainage. Her flat knit garments (has been wearing circular knit) should arrive this week and these should be more effective than circular knit.     Rehab Potential  Excellent    PT Frequency  2x / week    PT Duration  4 weeks    PT Treatment/Interventions  ADLs/Self Care Home Management;Therapeutic exercise;Patient/family education;Orthotic Fit/Training;Manual techniques;Manual lymph drainage;Compression bandaging;Vasopneumatic Device;Scar mobilization    PT Next Visit Plan  After this cert is up recert pt at 1x/wk for 4 weeks  to be able to assess new garments once they arrive, Measure circumference; Cont with manual lymph drainage and assesing circumference.     Consulted and Agree with Plan of Care  Patient       Patient will benefit from skilled therapeutic intervention in order to improve the following deficits and impairments:  Pain, Decreased strength, Increased edema  Visit Diagnosis: Lymphedema, not elsewhere classified     Problem List Patient Active Problem List   Diagnosis Date Noted  . Genetic testing 09/27/2017  . Family history of brain cancer   . Family history of pancreatic cancer   . Family history of colon cancer   . Family history of ovarian cancer   . Family history of melanoma   . Malignant melanoma ([place])   . Family history of cancer 05/29/2013  . Essential hypertension 01/09/2009  . Dyslipidemia 10/09/2007    [name], PTA 12/19/2017, 8:50 AM  [place], [place], [address] Phone: [phone]   Fax:  [phone]  Name: LAMEES GABLE MRN: [id number] Date of Birth: 05-09-1966

## 2017-12-27 ENCOUNTER — Ambulatory Visit: Payer: BLUE CROSS/BLUE SHIELD | Attending: General Surgery

## 2017-12-27 DIAGNOSIS — M6281 Muscle weakness (generalized): Secondary | ICD-10-CM | POA: Diagnosis not present

## 2017-12-27 DIAGNOSIS — I890 Lymphedema, not elsewhere classified: Secondary | ICD-10-CM

## 2017-12-27 NOTE — Therapy (Signed)
[place], [place], [address] Phone: [phone]   Fax:  [phone]  Physical Therapy Treatment  Patient Details  Name: Dawn Cole MRN: [id number] Date of Birth: 12/30/1965 Referring Provider: Barry Dienes    Encounter Date: 12/27/2017  PT End of Session - 12/27/17 0848    Visit Number  20    Number of Visits  25    Date for PT Re-Evaluation  01/24/18    PT Start Time  0803    PT Stop Time  7425    PT Time Calculation (min)  44 min    Activity Tolerance  Patient tolerated treatment well    Behavior During Therapy  [facility] for tasks assessed/performed       Past Medical History:  Diagnosis Date  . 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 Family history of brain cancer   . Family history of colon cancer   . Family history of melanoma   . Family history of ovarian cancer   . Family history of pancreatic cancer   . HYPERLIPIDEMIA 10/09/2007  . HYPERTENSION 01/09/2009  . Malignant melanoma ([place])     Past Surgical History:  Procedure Laterality Date  . ABDOMINAL HYSTERECTOMY    . COLONOSCOPY  12/2016  . HYSTEROSCOPY    . LIPOMA EXCISION Left 08/02/2017   Procedure: WIDE LOCAL EXCISION WITH ADVANCEMENT FLAP CLOSURE LEFT LOWER LEG AND SENTINEL LYMPH NODE MAPPING AND BIOPOSY;  Surgeon: [name], MD;  Location: [facility];  Service: General;  Laterality: Left;  GENERAL AND LOCAL    There were no vitals filed for this visit.  Subjective Assessment - 12/27/17 0809    Subjective  The swelling in my Lt upper thigh overall is maintaining well though the upper inner thigh does seem to fluctuate alot. I'm hoping once I get the flat knit garments that it will stabilize more. I'm going to call [facility] today and see if it's come in yet, it's been almost 3 weeks.     Patient Stated Goals  to get my swelling down and get back to exercising. 11/18/2017  Pt says she has returned to exerciseing but at  a lower level.     Currently in Pain?  No/denies                      [facility] Adult PT Treatment/Exercise - 12/27/17 0001      Manual Therapy   Manual Lymphatic Drainage (MLD)  In supine, short neck, superficial and deep abdominals, right inguinal nodes with anterior interinguinal anastamosis, left axillay nodes and left axillo-inguinal anastamosis, left thigh, especially upper thigh at firm area just distal to incision with extra time spent in this area.                     [facility] Goals - 12/27/17 0850      CC Long Term Goal  #1   Title  Pt will receive appropriate compression garments for long term management of lymphedema.    Baseline  has circular knit, still waiting for flat knit and nighttime garment     Status  Partially Met      CC Long Term Goal  #2   Title  Pt will be able to perform compression bandaging indpendently for long term managment of edema    Status  Achieved      CC Long Term Goal  #  3   Title  Pt will report a 75% improvement in LLE swelling to decrease risk of infection.    Baseline  still has fullness in left upper medial thigh; 85% improvement-12/27/17    Status  Achieved      CC Long Term Goal  #4   Title  Pt will demonstrate gross 5/5 strength in LLE to allow pt to return to PLOF    Baseline  4/5 grossly; didn't measure officially today but pt reports feels her strength has improved and she is working out 5-6x/week-12/27/17    Status  Partially Met         Plan - 12/27/17 0853    Clinical Impression Statement  Pt overall feels her fluid is beginning to maintain well though she still has flare ups of fibrosis at her Lt upper medial thigh. though these episodes are not as frequent or intense. She is still awaiting arrival of her flat knit garments and would like to cont coming once/week until then to help keep her flare ups of fluid maintained.     Rehab Potential  Excellent    PT Frequency  1x / week    PT Duration  4  weeks    PT Treatment/Interventions  ADLs/Self Care Home Management;Therapeutic exercise;Patient/family education;Orthotic Fit/Training;Manual techniques;Manual lymph drainage;Compression bandaging;Vasopneumatic Device;Scar mobilization    PT Next Visit Plan  Recert for 1x/wk to be able to assess new flat knit garments once they arrive. Measure circumference and cont with manual lymph drainage.     Consulted and Agree with Plan of Care  Patient       Patient will benefit from skilled therapeutic intervention in order to improve the following deficits and impairments:  Pain, Decreased strength, Increased edema  Visit Diagnosis: Lymphedema, not elsewhere classified  Muscle weakness (generalized)     Problem List Patient Active Problem List   Diagnosis Date Noted  . Genetic testing 09/27/2017  . Family history of brain cancer   . Family history of pancreatic cancer   . Family history of colon cancer   . Family history of ovarian cancer   . Family history of melanoma   . Malignant melanoma ([place])   . Family history of cancer 05/29/2013  . Essential hypertension 01/09/2009  . Dyslipidemia 10/09/2007    [name], PTA 12/27/2017, 8:59 AM  [place], [place], [address] Phone: [phone]   Fax:  [phone]  Name: Dawn Cole MRN: [id number] Date of Birth: 1966/02/16

## 2018-01-02 ENCOUNTER — Ambulatory Visit: Payer: BLUE CROSS/BLUE SHIELD | Admitting: Family Medicine

## 2018-01-02 ENCOUNTER — Encounter: Payer: Self-pay | Admitting: Family Medicine

## 2018-01-02 ENCOUNTER — Ambulatory Visit: Payer: Self-pay | Admitting: *Deleted

## 2018-01-02 VITALS — BP 100/60 | HR 70 | Temp 97.8°F | Ht 63.0 in | Wt 179.7 lb

## 2018-01-02 DIAGNOSIS — R21 Rash and other nonspecific skin eruption: Secondary | ICD-10-CM

## 2018-01-02 MED ORDER — CEPHALEXIN 500 MG PO CAPS: 500.0000 mg | ORAL_CAPSULE | Freq: Two times a day (BID) | ORAL | 0 refills | Status: DC

## 2018-01-02 MED ORDER — TRIAMCINOLONE ACETONIDE 0.1 % EX CREA: 1.0000 "application " | TOPICAL_CREAM | Freq: Two times a day (BID) | CUTANEOUS | 0 refills | Status: DC

## 2018-01-02 NOTE — Progress Notes (Signed)
HPI:  Acute visit for Rash: -started a few days ago -L leg only -mildly pruritic -has shaved -wears compression on that leg only as has hx lymphedema surgical tx melanoma that leg -new compression socks, but thinks rash started before using -denies rash elsewhere, fevers, malaise, increased LE edema  ROS: See pertinent positives and negatives per HPI.  Past Medical History:  Diagnosis Date  . 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 Family history of brain cancer   . Family history of colon cancer   . Family history of melanoma   . Family history of ovarian cancer   . Family history of pancreatic cancer   . HYPERLIPIDEMIA 10/09/2007  . HYPERTENSION 01/09/2009  . Malignant melanoma ([place])     Past Surgical History:  Procedure Laterality Date  . ABDOMINAL HYSTERECTOMY    . COLONOSCOPY  12/2016  . HYSTEROSCOPY    . LIPOMA EXCISION Left 08/02/2017   Procedure: WIDE LOCAL EXCISION WITH ADVANCEMENT FLAP CLOSURE LEFT LOWER LEG AND SENTINEL LYMPH NODE MAPPING AND BIOPOSY;  Surgeon: [name], MD;  [facility];  Service: General;  Laterality: Left;  GENERAL AND LOCAL    Family History  Problem Relation Age of Onset  . Colon cancer Mother        dx in her late 77s, mets to pancrease, liver and lungs  . Brain cancer Father 75  . Lung cancer Brother 42       heavy smoker  . Lung cancer Maternal Uncle   . Colon cancer Maternal Grandfather 51  . Pancreatic cancer Brother 46       maternal half brother - was exposed to agent orange  . Ovarian cancer Maternal Aunt        dx in her late 41s  . Lung cancer Maternal Aunt   . Melanoma Maternal Aunt   . Multiple myeloma Cousin        maternal first cousin  . Kidney cancer Cousin   . Heart failure Paternal Grandmother 83    Social History   Socioeconomic History  . Marital status: Married    Spouse name: None  . Number of children: None  . Years of education: None  . Highest education level: None  Social  Needs  . Financial resource strain: None  . Food insecurity - worry: None  . Food insecurity - inability: None  . Transportation needs - medical: None  . Transportation needs - non-medical: None  Occupational History  . None  Tobacco Use  . Smoking status: Never Smoker  . Smokeless tobacco: Never Used  Substance and Sexual Activity  . Alcohol use: No  . Drug use: No  . Sexual activity: None  Other Topics Concern  . None  Social History Narrative  . None     Current Outpatient Medications:  .  amLODipine-benazepril (LOTREL) 5-10 MG capsule, TAKE 1 BY MOUTH DAILY (Patient taking differently: Take 1 capsule by mouth daily. TAKE 1 BY MOUTH DAILY), Disp: 30 capsule, Rfl: 0 .  calcium carbonate (OS-CAL) 600 MG TABS tablet, Take 600 mg by mouth daily., Disp: , Rfl:  .  estradiol (VIVELLE-DOT) 0.075 MG/24HR, Place 0.5 patches onto the skin 2 (two) times a week. , Disp: , Rfl:  .  oxyCODONE (OXY IR/ROXICODONE) 5 MG immediate release tablet, Take 1-2 tablets (5-10 mg total) by mouth every 6 (six) hours as needed for moderate pain, severe pain or breakthrough pain., Disp: 20 tablet, Rfl: 0 .  tolterodine (DETROL LA) 2 MG 24 hr capsule, Take 2 mg by mouth daily. , Disp: , Rfl:  .  cephALEXin (KEFLEX) 500 MG capsule, Take 1 capsule (500 mg total) by mouth 2 (two) times daily., Disp: 10 capsule, Rfl: 0 .  triamcinolone cream (KENALOG) 0.1 %, Apply 1 application topically 2 (two) times daily., Disp: 30 g, Rfl: 0  EXAM:  Vitals:   01/02/18 1317  BP: 100/60  Pulse: 70  Temp: 97.8 F (36.6 C)    Body mass index is 31.83 kg/m.  GENERAL: vitals reviewed and listed above, alert, oriented, appears well hydrated and in no acute distress  HEENT: atraumatic, conjunttiva clear, no obvious abnormalities on inspection of external nose and ears  NECK: no obvious masses on inspection  MS: moves all extremities without noticeable abnormality  SKIN: erythematous papule L leg - all areas where  shaves, no sig edema in this leg  PSYCH: pleasant and cooperative, no obvious depression or anxiety  ASSESSMENT AND PLAN:  Discussed the following assessment and plan:  Skin rash  -query folliculitis vs contact dermatitis vs other -she is very worried about infection as she was told is succeptable - will tx with keflex, also antihistamine and top steroid for possible contact derm  -advised f/u hereo r with derm if worsening or does not resolve with  treatment    Patient Instructions  Take the antibiotic as prescribed.  Zyrtec nightly.  Topical steroid also sent to the pharmacy.  I hope you are feeling better soon! Seek care promptly if your symptoms worsen, new concerns arise or you are not improving with treatment.     Colin Benton R., DO

## 2018-01-02 NOTE — Telephone Encounter (Signed)
Pt states that she had surgery to remove melanoma on left leg and lymph nodes in August 2018; she states that she has lymphedema related to this;  last Wednesday 12/28/16 she noticed red marks around her toes it progressed to a rash initially above her ankle and has progressed to the bottom half of leg; she is having no pain but is experiencing itching and hydrocortisone cream does stops the itching; she also says that she feels the itching the most when her compression hose are off; she has not changed detergents or anything; per nurse triage protocol recommended that pt see physician within 3 days; pt offered and accepted 1315 appointment with Dr Maudie Mercury today; pt verbalizes understanding.      Reason for Disposition . [1] Pimples (localized) AND [place].Ok ] NO improvement after using Care Advice  Answer Assessment - Initial Assessment Questions 1. APPEARANCE of RASH: "Describe the rash."      Red with bumps 2. LOCATION: "Where is the rash located?"      Left leg 3. NUMBER: "How many spots are there?"      Too numerous to count 4. SIZE: "How big are the spots?" (Inches, centimeters or compare to size of a coin)      Clusters of little bumps 5. ONSET: "When did the rash start?"      12/28/16 6. ITCHING: "Does the rash itch?" If so, ask: "How bad is the itch?"  (Scale 1-10; or mild, moderate, severe)     Mild during the day but itches a lot at night 7. PAIN: "Does the rash hurt?" If so, ask: "How bad is the pain?"  (Scale 1-10; or mild, moderate, severe)     no 8. OTHER SYMPTOMS: "Do you have any other symptoms?" (e.g., fever)     Head congestion, stuffy nose  9. PREGNANCY: "Is there any chance you are pregnant?" "When was your last menstrual period?"     No hysterectomy  Protocols used: RASH OR REDNESS - LOCALIZED-A-AH

## 2018-01-02 NOTE — Patient Instructions (Signed)
Take the antibiotic as prescribed.  Zyrtec nightly.  Topical steroid also sent to the pharmacy.  I hope you are feeling better soon! Seek care promptly if your symptoms worsen, new concerns arise or you are not improving with treatment.

## 2018-01-03 ENCOUNTER — Ambulatory Visit: Payer: BLUE CROSS/BLUE SHIELD

## 2018-01-03 DIAGNOSIS — I890 Lymphedema, not elsewhere classified: Secondary | ICD-10-CM

## 2018-01-03 NOTE — Therapy (Addendum)
[place], [place], [address] Phone: [phone]   Fax:  [phone]  Physical Therapy Treatment  Patient Details  Name: KRITHIKA TOME MRN: [id number] Date of Birth: 1966-12-10 Referring Provider: Barry Dienes    Encounter Date: 01/03/2018  PT End of Session - 01/03/18 0823    Visit Number  21    Number of Visits  25    Date for PT Re-Evaluation  01/24/18    PT Start Time  0806    PT Stop Time  0820 No charge visit    PT Time Calculation (min)  14 min       Past Medical History:  Diagnosis Date  . 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 Family history of brain cancer   . Family history of colon cancer   . Family history of melanoma   . Family history of ovarian cancer   . Family history of pancreatic cancer   . HYPERLIPIDEMIA 10/09/2007  . HYPERTENSION 01/09/2009  . Malignant melanoma ([place])     Past Surgical History:  Procedure Laterality Date  . ABDOMINAL HYSTERECTOMY    . COLONOSCOPY  12/2016  . HYSTEROSCOPY    . LIPOMA EXCISION Left 08/02/2017   Procedure: WIDE LOCAL EXCISION WITH ADVANCEMENT FLAP CLOSURE LEFT LOWER LEG AND SENTINEL LYMPH NODE MAPPING AND BIOPOSY;  Surgeon: [name], MD;  Location: [facility];  Service: General;  Laterality: Left;  GENERAL AND LOCAL    There were no vitals filed for this visit.  Subjective Assessment - 01/03/18 5284    Subjective  Pt came in reporting she had been put on an antibiotic yesterday by her PCP for a rash that has progressively been getting worse since Wednesday. Started on Lt dorsal aspect of foot (looks like small raised bumps that are palpable) and since Wed has worked it's way up her lower leg and she reports a few spots on her thigh.     Pertinent History  WLE/advanced flap closure and inguinal lymph SNLB 08/02/17 for malignant melanoma    Patient Stated Goals  to get my swelling down and get back to exercising. 11/18/2017  Pt says  she has returned to exerciseing but at a lower level.     Currently in Pain?  No/denies         UNABLE TO TREAT PT TODAY AS SHE HAS A RASH ON HER LT LEG THAT HAS BEEN PROGRESSING SINCE LAST Wednesday AND SHE WAS STARTED ON AN ANTIBIOTIC BY HER PCP YESTERDAY.                              [facility] Goals - 12/27/17 0850      CC Long Term Goal  #1   Title  Pt will receive appropriate compression garments for long term management of lymphedema.    Baseline  has circular knit, still waiting for flat knit and nighttime garment     Status  Partially Met      CC Long Term Goal  #2   Title  Pt will be able to perform compression bandaging indpendently for long term managment of edema    Status  Achieved      CC Long Term Goal  #3   Title  Pt will report a 75% improvement in LLE swelling to decrease risk of infection.    Baseline  still has fullness in  left upper medial thigh; 85% improvement-12/27/17    Status  Achieved      CC Long Term Goal  #4   Title  Pt will demonstrate gross 5/5 strength in LLE to allow pt to return to PLOF    Baseline  4/5 grossly; didn't measure officially today but pt reports feels her strength has improved and she is working out 5-6x/week-12/27/17    Status  Partially Met         Plan - 01/03/18 0824    Clinical Impression Statement  Unable to treat pt today. She arrived with small, raised bumps on her Lt dorsal foot, lower leg and a few spots on her thigh. She reports this started last Wednesday and has progressively been getting worse. Saw her PCP yesterday and she said it looked like razor burn however, this has been progressing. PCP did start her on an antibiotic which pt started last night. Instructed her that if she doesn't see any improvement or symptoms progress she should see her dermatologist (as she was instructed to do by her PCP) or go to ED if symptoms seem to rapidly progress. Pt verbalized understanding this.     PT  Next Visit Plan  D/C this visit per pt request.     Consulted and Agree with Plan of Care  Patient       Patient will benefit from skilled therapeutic intervention in order to improve the following deficits and impairments:     Visit Diagnosis: Lymphedema, not elsewhere classified     Problem List Patient Active Problem List   Diagnosis Date Noted  . Genetic testing 09/27/2017  . Family history of brain cancer   . Family history of pancreatic cancer   . Family history of colon cancer   . Family history of ovarian cancer   . Family history of melanoma   . Malignant melanoma ([place])   . Family history of cancer 05/29/2013  . Essential hypertension 01/09/2009  . Dyslipidemia 10/09/2007    [name], PTA 01/03/2018, 9:46 AM  [place], [place], [address] Phone: [phone]   Fax:  [phone]  Name: SHAIMA SARDINAS MRN: [id number] Date of Birth: 21-Jul-1966  PHYSICAL THERAPY DISCHARGE SUMMARY  Visits from Start of Care: 21  Current functional level related to goals / functional outcomes: Per pt she has met all goals for therapy and has received appropriate compression garments and requested to be discharged   Remaining deficits: None   Education / Equipment: Compression garments  Plan: Patient agrees to discharge.  Patient goals were met. Patient is being discharged due to meeting the stated rehab goals.  ?????    Allyson Sabal [place], [place] 01/04/18 12:13 PM

## 2018-01-06 DIAGNOSIS — I8312 Varicose veins of left lower extremity with inflammation: Secondary | ICD-10-CM | POA: Diagnosis not present

## 2018-01-06 DIAGNOSIS — I872 Venous insufficiency (chronic) (peripheral): Secondary | ICD-10-CM | POA: Diagnosis not present

## 2018-01-10 IMAGING — MG 2D DIGITAL SCREENING BILATERAL MAMMOGRAM WITH CAD AND ADJUNCT TO
9 of 12 series · 9 of 28 positions shown · non-contrast
Comparison: Previous exam(s).

CLINICAL DATA: Screening.

EXAM:
2D DIGITAL SCREENING BILATERAL MAMMOGRAM WITH CAD AND ADJUNCT TOMO

[R CC synth-2D]
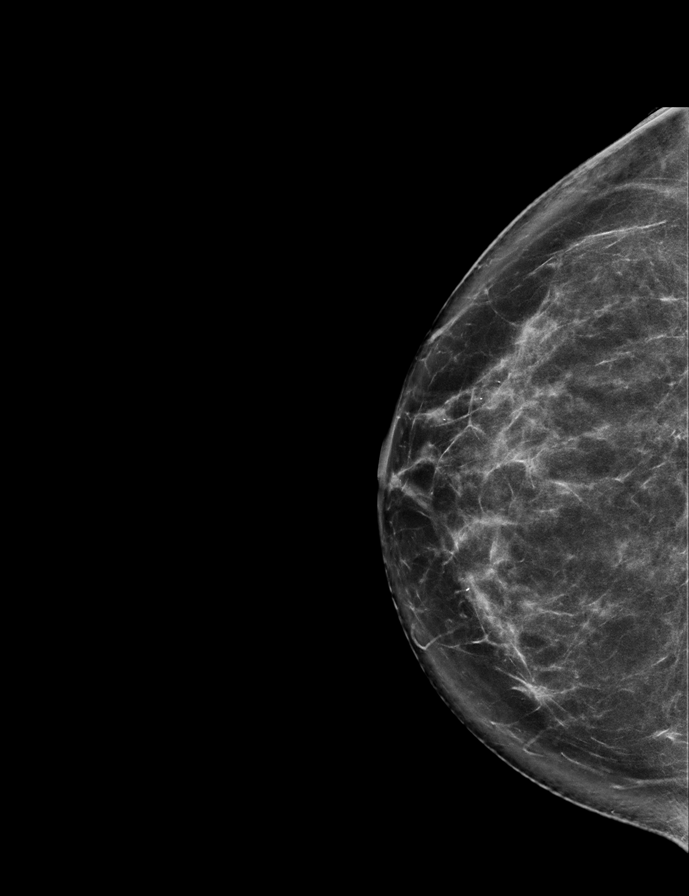

[R CC]
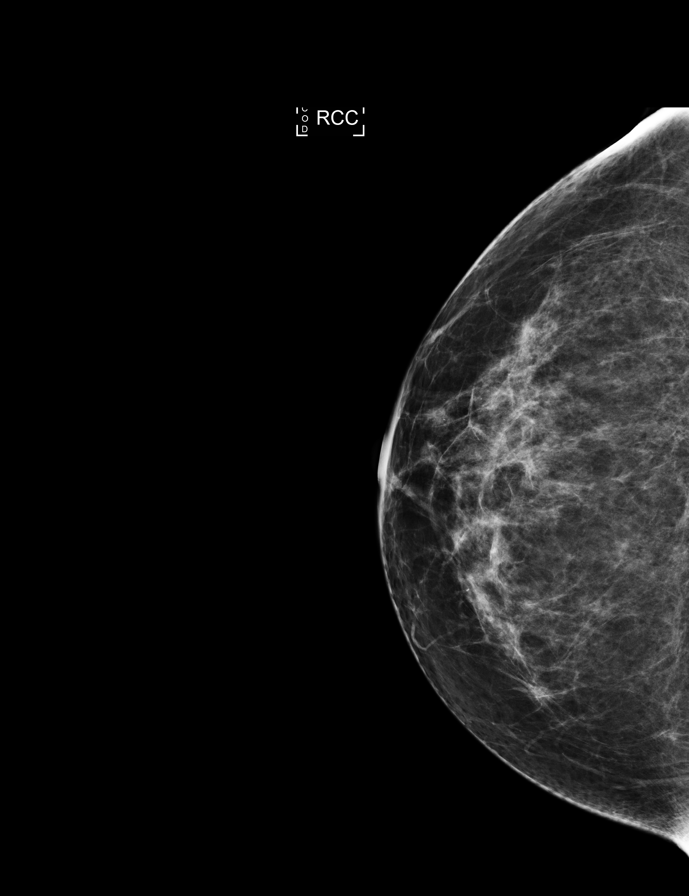

[L CC]
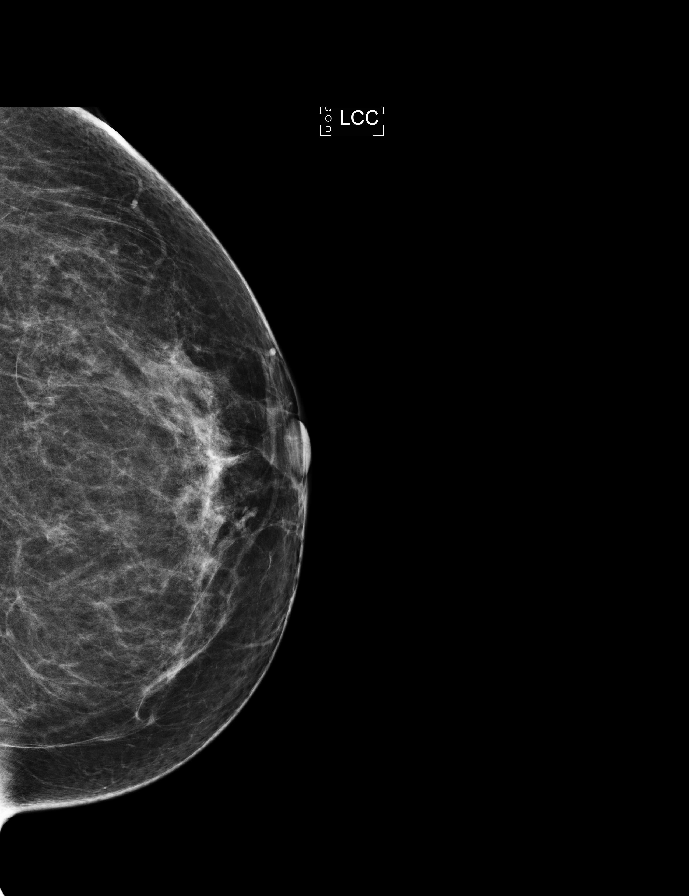

[L MLO synth-2D]
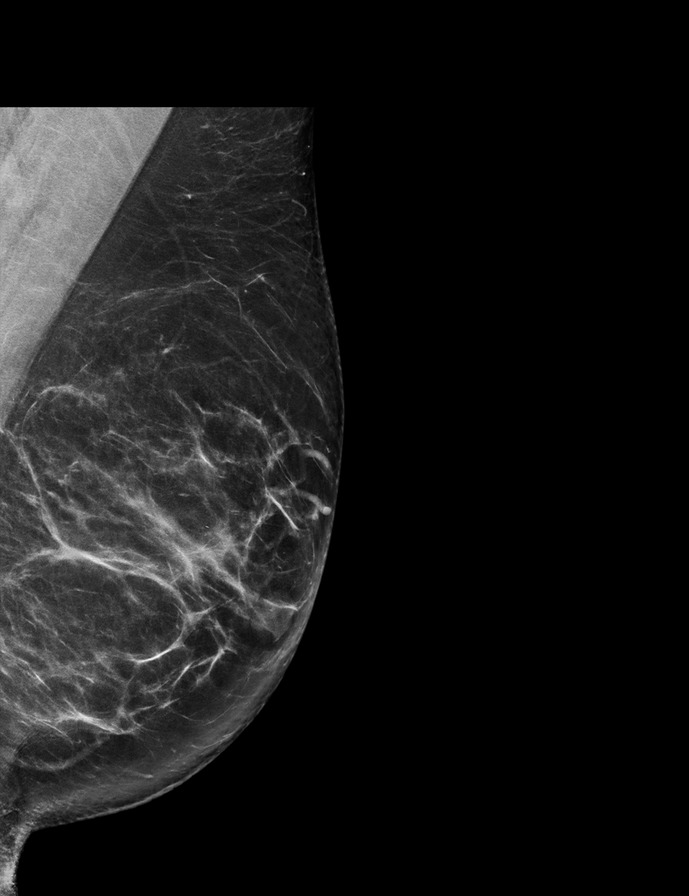

[L CC synth-2D]
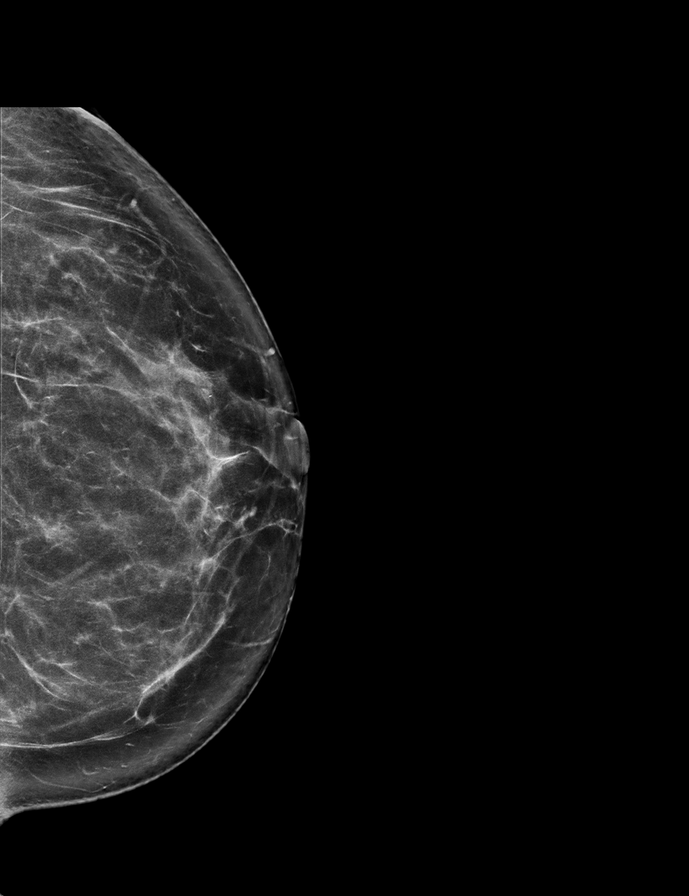

[L MLO]
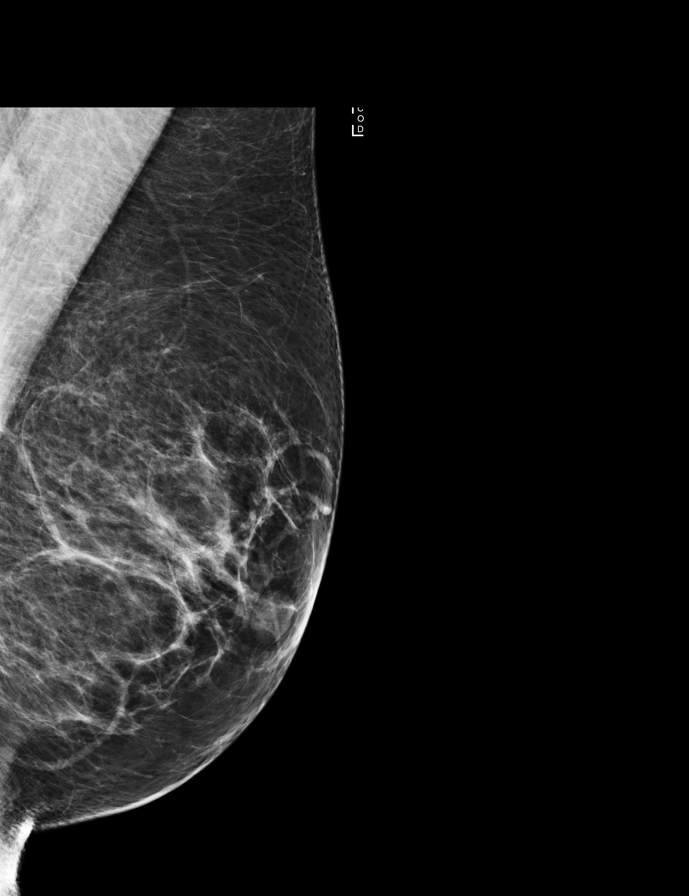

[R MLO]
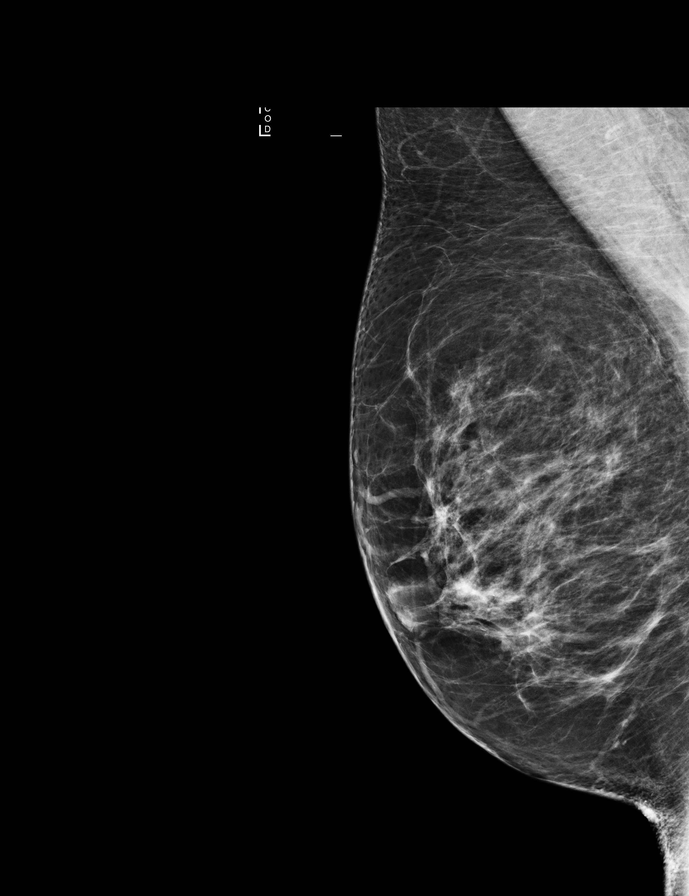

[R MLO synth-2D]
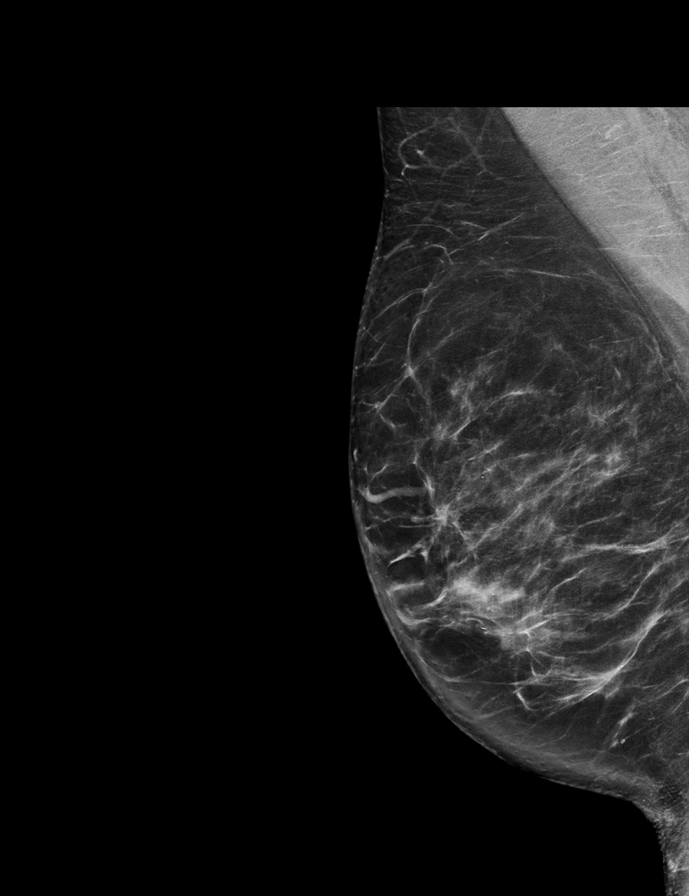

[R MLO tomo · tomo slice 39/78.0]
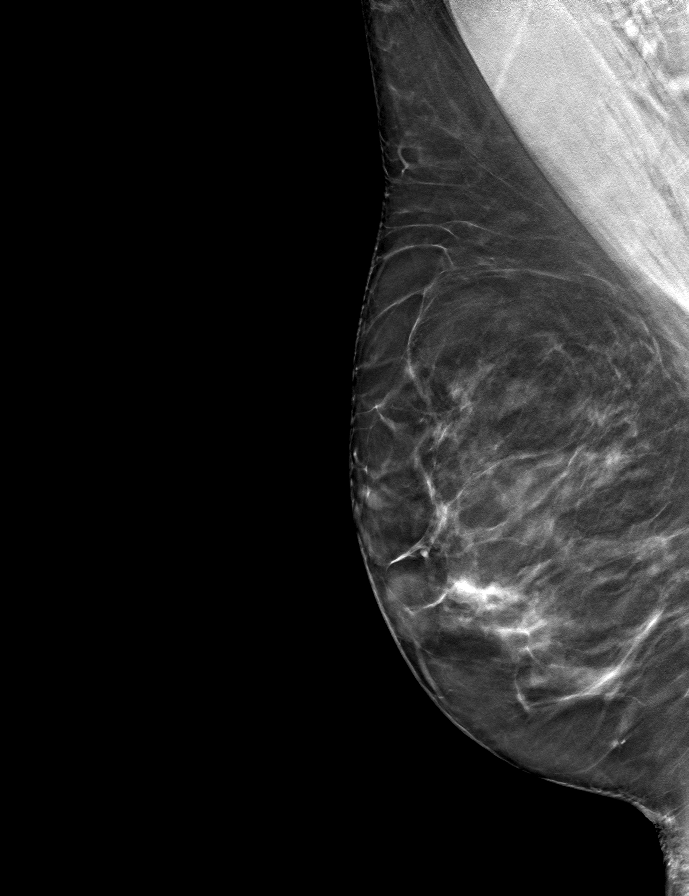

[9 of 28 positions shown; findings below may reference images not displayed]

ACR Breast Density Category b: There are scattered areas of
fibroglandular density.
FINDINGS: There are no findings suspicious for malignancy. Images were
processed with CAD.
IMPRESSION: No mammographic evidence of malignancy. A result letter of this
screening mammogram will be mailed directly to the patient.

RECOMMENDATION:
Screening mammogram in one year. (Code:97-6-RS4)

BI-RADS CATEGORY  1: Negative.

## 2018-01-31 ENCOUNTER — Ambulatory Visit (INDEPENDENT_AMBULATORY_CARE_PROVIDER_SITE_OTHER): Payer: BLUE CROSS/BLUE SHIELD | Admitting: Internal Medicine

## 2018-01-31 ENCOUNTER — Encounter: Payer: Self-pay | Admitting: Internal Medicine

## 2018-01-31 VITALS — BP 120/80 | HR 78 | Temp 98.2°F | Ht 64.0 in | Wt 183.0 lb

## 2018-01-31 DIAGNOSIS — Z0000 Encounter for general adult medical examination without abnormal findings: Secondary | ICD-10-CM | POA: Diagnosis not present

## 2018-01-31 DIAGNOSIS — R32 Unspecified urinary incontinence: Secondary | ICD-10-CM | POA: Insufficient documentation

## 2018-01-31 DIAGNOSIS — Z780 Asymptomatic menopausal state: Secondary | ICD-10-CM | POA: Insufficient documentation

## 2018-01-31 LAB — LIPID PANEL
Cholesterol: 243 mg/dL — ABNORMAL HIGH (ref 0–200)
HDL: 50 mg/dL (ref >39.00)
LDL Cholesterol: 156 mg/dL — ABNORMAL HIGH (ref 0–99)
NonHDL: 192.75
Total CHOL/HDL Ratio: 5
Triglycerides: 183 mg/dL — ABNORMAL HIGH (ref 0.0–149.0)
VLDL: 36.6 mg/dL (ref 0.0–40.0)

## 2018-01-31 LAB — TSH: TSH: 2.22 u[IU]/mL (ref 0.35–4.50)

## 2018-01-31 NOTE — Progress Notes (Signed)
Subjective:    Patient ID: Dawn Cole, female    DOB: 11/24/1966, 52 y.o.   MRN: [id number]  HPI  52 year old patient who is seen today for an annual examination.  She has a history of essential hypertension. She is doing quite well.  In the past year she has had a melanoma resection involving the left calf area.  She has had genetic testing performed due to a family history that includes brain, pancreatic ovarian and melanoma and colon cancer.  Genetic testing negative  She has had a mammogram in the fall and a colonoscopy approximately 1 year ago.  She is scheduled for gynecologic follow-up soon  Past Medical History:  Diagnosis Date  . 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 Family history of brain cancer   . Family history of colon cancer   . Family history of melanoma   . Family history of ovarian cancer   . Family history of pancreatic cancer   . HYPERLIPIDEMIA 10/09/2007  . HYPERTENSION 01/09/2009  . Malignant melanoma ([place])      Social History   Socioeconomic History  . Marital status: Married    Spouse name: Not on file  . Number of children: Not on file  . Years of education: Not on file  . Highest education level: Not on file  Social Needs  . Financial resource strain: Not on file  . Food insecurity - worry: Not on file  . Food insecurity - inability: Not on file  . Transportation needs - medical: Not on file  . Transportation needs - non-medical: Not on file  Occupational History  . Not on file  Tobacco Use  . Smoking status: Never Smoker  . Smokeless tobacco: Never Used  Substance and Sexual Activity  . Alcohol use: No  . Drug use: No  . Sexual activity: Not on file  Other Topics Concern  . Not on file  Social History Narrative  . Not on file    Past Surgical History:  Procedure Laterality Date  . ABDOMINAL HYSTERECTOMY    . COLONOSCOPY  12/2016  . HYSTEROSCOPY    . LIPOMA EXCISION Left 08/02/2017   Procedure: WIDE LOCAL  EXCISION WITH ADVANCEMENT FLAP CLOSURE LEFT LOWER LEG AND SENTINEL LYMPH NODE MAPPING AND BIOPOSY;  Surgeon: [name], MD;  Location: [facility] OR;  Service: General;  Laterality: Left;  GENERAL AND LOCAL    Family History  Problem Relation Age of Onset  . Colon cancer Mother        dx in her late 29s, mets to pancrease, liver and lungs  . Brain cancer Father 83  . Lung cancer Brother 56       heavy smoker  . Lung cancer Maternal Uncle   . Colon cancer Maternal Grandfather 5  . Pancreatic cancer Brother 24       maternal half brother - was exposed to agent orange  . Ovarian cancer Maternal Aunt        dx in her late 59s  . Lung cancer Maternal Aunt   . Melanoma Maternal Aunt   . Multiple myeloma Cousin        maternal first cousin  . Kidney cancer Cousin   . Heart failure Paternal Grandmother 60    Allergies  Allergen Reactions  . Adhesive [Tape] Other (See Comments)    BANDAIDS- RIPS SKIN OFF     Current Outpatient Medications on File Prior to Visit  Medication Sig  Dispense Refill  . amLODipine-benazepril (LOTREL) 5-10 MG capsule TAKE 1 BY MOUTH DAILY (Patient taking differently: Take 1 capsule by mouth daily. TAKE 1 BY MOUTH DAILY) 30 capsule 0  . calcium carbonate (OS-CAL) 600 MG TABS tablet Take 600 mg by mouth daily.    Marland Kitchen estradiol (VIVELLE-DOT) 0.075 MG/24HR Place 0.5 patches onto the skin 2 (two) times a week.     . tolterodine (DETROL LA) 2 MG 24 hr capsule Take 2 mg by mouth daily.     Marland Kitchen triamcinolone cream (KENALOG) 0.1 % Apply 1 application topically 2 (two) times daily. 30 g 0   No current facility-administered medications on file prior to visit.     BP 120/80 (BP Location: Right Arm, Patient Position: Sitting, Cuff Size: Normal)   Pulse 78   Temp 98.2 F (36.8 C) (Oral)   Ht '5\' 4"'$  (1.626 m)   Wt 183 lb (83 kg)   SpO2 98%   BMI 31.41 kg/m       Review of Systems  Constitutional: Negative.   HENT: Negative for congestion, dental problem, hearing  loss, rhinorrhea, sinus pressure, sore throat and tinnitus.   Eyes: Negative for pain, discharge and visual disturbance.  Respiratory: Negative for cough and shortness of breath.   Cardiovascular: Positive for leg swelling. Negative for chest pain and palpitations.  Gastrointestinal: Negative for abdominal distention, abdominal pain, blood in stool, constipation, diarrhea, nausea and vomiting.  Genitourinary: Negative for difficulty urinating, dysuria, flank pain, frequency, hematuria, pelvic pain, urgency, vaginal bleeding, vaginal discharge and vaginal pain.  Musculoskeletal: Negative for arthralgias, gait problem and joint swelling.  Skin: Negative for rash.  Neurological: Negative for dizziness, syncope, speech difficulty, weakness, numbness and headaches.  Hematological: Negative for adenopathy.  Psychiatric/Behavioral: Negative for agitation, behavioral problems and dysphoric mood. The patient is not nervous/anxious.        Objective:   Physical Exam  Constitutional: She is oriented to person, place, and time. She appears well-developed and well-nourished.   Blood pressure 118/78 Weight 183   HENT:  Head: Normocephalic.  Right Ear: External ear normal.  Left Ear: External ear normal.  Mouth/Throat: Oropharynx is clear and moist.  Eyes: Conjunctivae and EOM are normal. Pupils are equal, round, and reactive to light.  Neck: Normal range of motion. Neck supple. No thyromegaly present.  Cardiovascular: Normal rate, regular rhythm, normal heart sounds and intact distal pulses.  Pulmonary/Chest: Effort normal and breath sounds normal.  Abdominal: Soft. Bowel sounds are normal. She exhibits no mass. There is no tenderness.  Musculoskeletal: Normal range of motion. She exhibits edema.  Chronic left leg lymphedema  Lymphadenopathy:    She has no cervical adenopathy.  Neurological: She is alert and oriented to person, place, and time.  Skin: Skin is warm and dry. No rash noted.    Psychiatric: She has a normal mood and affect. Her behavior is normal.          Assessment & Plan:   Preventive health examination Essential hypertension well-controlled Strong family history of multiple neoplasms.  Negative genetic testing Chronic left leg lymphedema History of mild dyslipidemia Will review a lipid profile and TSH.  Patient has other laboratory studies performed earlier this past fall Continue home blood pressure monitoring  No change in medical regimen Gynecologic follow-up as scheduled  Nyoka Cowden

## 2018-01-31 NOTE — Patient Instructions (Addendum)
Limit your sodium (Salt) intake  Please check your blood pressure on a regular basis.  If it is consistently greater than 150/90, please make an office appointment.  Return in one year for follow-up   Health Maintenance for Postmenopausal Women Menopause is a normal process in which your reproductive ability comes to an end. This process happens gradually over a span of months to years, usually between the ages of 67 and 70. Menopause is complete when you have missed 12 consecutive menstrual periods. It is important to talk with your health care provider about some of the most common conditions that affect postmenopausal women, such as heart disease, cancer, and bone loss (osteoporosis). Adopting a healthy lifestyle and getting preventive care can help to promote your health and wellness. Those actions can also lower your chances of developing some of these common conditions. What should I know about menopause? During menopause, you may experience a number of symptoms, such as:  Moderate-to-severe hot flashes.  Night sweats.  Decrease in sex drive.  Mood swings.  Headaches.  Tiredness.  Irritability.  Memory problems.  Insomnia.  Choosing to treat or not to treat menopausal changes is an individual decision that you make with your health care provider. What should I know about hormone replacement therapy and supplements? Hormone therapy products are effective for treating symptoms that are associated with menopause, such as hot flashes and night sweats. Hormone replacement carries certain risks, especially as you become older. If you are thinking about using estrogen or estrogen with progestin treatments, discuss the benefits and risks with your health care provider. What should I know about heart disease and stroke? Heart disease, heart attack, and stroke become more likely as you age. This may be due, in part, to the hormonal changes that your body experiences during menopause.  These can affect how your body processes dietary fats, triglycerides, and cholesterol. Heart attack and stroke are both medical emergencies. There are many things that you can do to help prevent heart disease and stroke:  Have your blood pressure checked at least every 1-2 years. High blood pressure causes heart disease and increases the risk of stroke.  If you are 37-74 years old, ask your health care provider if you should take aspirin to prevent a heart attack or a stroke.  Do not use any tobacco products, including cigarettes, chewing tobacco, or electronic cigarettes. If you need help quitting, ask your health care provider.  It is important to eat a healthy diet and maintain a healthy weight. ? Be sure to include plenty of vegetables, fruits, low-fat dairy products, and lean protein. ? Avoid eating foods that are high in solid fats, added sugars, or salt (sodium).  Get regular exercise. This is one of the most important things that you can do for your health. ? Try to exercise for at least 150 minutes each week. The type of exercise that you do should increase your heart rate and make you sweat. This is known as moderate-intensity exercise. ? Try to do strengthening exercises at least twice each week. Do these in addition to the moderate-intensity exercise.  Know your numbers.Ask your health care provider to check your cholesterol and your blood glucose. Continue to have your blood tested as directed by your health care provider.  What should I know about cancer screening? There are several types of cancer. Take the following steps to reduce your risk and to catch any cancer development as early as possible. Breast Cancer  Practice breast self-awareness. ?  This means understanding how your breasts normally appear and feel. ? It also means doing regular breast self-exams. Let your health care provider know about any changes, no matter how small.  If you are 22 or older, have a  clinician do a breast exam (clinical breast exam or CBE) every year. Depending on your age, family history, and medical history, it may be recommended that you also have a yearly breast X-ray (mammogram).  If you have a family history of breast cancer, talk with your health care provider about genetic screening.  If you are at high risk for breast cancer, talk with your health care provider about having an MRI and a mammogram every year.  Breast cancer (BRCA) gene test is recommended for women who have family members with BRCA-related cancers. Results of the assessment will determine the need for genetic counseling and BRCA1 and for BRCA2 testing. BRCA-related cancers include these types: ? Breast. This occurs in males or females. ? Ovarian. ? Tubal. This may also be called fallopian tube cancer. ? Cancer of the abdominal or pelvic lining (peritoneal cancer). ? Prostate. ? Pancreatic.  Cervical, Uterine, and Ovarian Cancer Your health care provider may recommend that you be screened regularly for cancer of the pelvic organs. These include your ovaries, uterus, and vagina. This screening involves a pelvic exam, which includes checking for microscopic changes to the surface of your cervix (Pap test).  For women ages 21-65, health care providers may recommend a pelvic exam and a Pap test every three years. For women ages 65-65, they may recommend the Pap test and pelvic exam, combined with testing for human papilloma virus (HPV), every five years. Some types of HPV increase your risk of cervical cancer. Testing for HPV may also be done on women of any age who have unclear Pap test results.  Other health care providers may not recommend any screening for nonpregnant women who are considered low risk for pelvic cancer and have no symptoms. Ask your health care provider if a screening pelvic exam is right for you.  If you have had past treatment for cervical cancer or a condition that could lead to  cancer, you need Pap tests and screening for cancer for at least 20 years after your treatment. If Pap tests have been discontinued for you, your risk factors (such as having a new sexual partner) need to be reassessed to determine if you should start having screenings again. Some women have medical problems that increase the chance of getting cervical cancer. In these cases, your health care provider may recommend that you have screening and Pap tests more often.  If you have a family history of uterine cancer or ovarian cancer, talk with your health care provider about genetic screening.  If you have vaginal bleeding after reaching menopause, tell your health care provider.  There are currently no reliable tests available to screen for ovarian cancer.  Lung Cancer Lung cancer screening is recommended for adults 73-29 years old who are at high risk for lung cancer because of a history of smoking. A yearly low-dose CT scan of the lungs is recommended if you:  Currently smoke.  Have a history of at least 30 pack-years of smoking and you currently smoke or have quit within the past 15 years. A pack-year is smoking an average of one pack of cigarettes per day for one year.  Yearly screening should:  Continue until it has been 15 years since you quit.  Stop if you develop  a health problem that would prevent you from having lung cancer treatment.  Colorectal Cancer  This type of cancer can be detected and can often be prevented.  Routine colorectal cancer screening usually begins at age 56 and continues through age 64.  If you have risk factors for colon cancer, your health care provider may recommend that you be screened at an earlier age.  If you have a family history of colorectal cancer, talk with your health care provider about genetic screening.  Your health care provider may also recommend using home test kits to check for hidden blood in your stool.  A small camera at the end of a  tube can be used to examine your colon directly (sigmoidoscopy or colonoscopy). This is done to check for the earliest forms of colorectal cancer.  Direct examination of the colon should be repeated every 5-10 years until age 83. However, if early forms of precancerous polyps or small growths are found or if you have a family history or genetic risk for colorectal cancer, you may need to be screened more often.  Skin Cancer  Check your skin from head to toe regularly.  Monitor any moles. Be sure to tell your health care provider: ? About any new moles or changes in moles, especially if there is a change in a mole's shape or color. ? If you have a mole that is larger than the size of a pencil eraser.  If any of your family members has a history of skin cancer, especially at a young age, talk with your health care provider about genetic screening.  Always use sunscreen. Apply sunscreen liberally and repeatedly throughout the day.  Whenever you are outside, protect yourself by wearing long sleeves, pants, a wide-brimmed hat, and sunglasses.  What should I know about osteoporosis? Osteoporosis is a condition in which bone destruction happens more quickly than new bone creation. After menopause, you may be at an increased risk for osteoporosis. To help prevent osteoporosis or the bone fractures that can happen because of osteoporosis, the following is recommended:  If you are 54-67 years old, get at least 1,000 mg of calcium and at least 600 mg of vitamin D per day.  If you are older than age 67 but younger than age 53, get at least 1,200 mg of calcium and at least 600 mg of vitamin D per day.  If you are older than age 41, get at least 1,200 mg of calcium and at least 800 mg of vitamin D per day.  Smoking and excessive alcohol intake increase the risk of osteoporosis. Eat foods that are rich in calcium and vitamin D, and do weight-bearing exercises several times each week as directed by your  health care provider. What should I know about how menopause affects my mental health? Depression may occur at any age, but it is more common as you become older. Common symptoms of depression include:  Low or sad mood.  Changes in sleep patterns.  Changes in appetite or eating patterns.  Feeling an overall lack of motivation or enjoyment of activities that you previously enjoyed.  Frequent crying spells.  Talk with your health care provider if you think that you are experiencing depression. What should I know about immunizations? It is important that you get and maintain your immunizations. These include:  Tetanus, diphtheria, and pertussis (Tdap) booster vaccine.  Influenza every year before the flu season begins.  Pneumonia vaccine.  Shingles vaccine.  Your health care provider may  also recommend other immunizations. This information is not intended to replace advice given to you by your health care provider. Make sure you discuss any questions you have with your health care provider. Document Released: 01/28/2006 Document Revised: 06/25/2016 Document Reviewed: 09/09/2015 Elsevier Interactive Patient Education  2018 [employer].

## 2018-02-03 DIAGNOSIS — I890 Lymphedema, not elsewhere classified: Secondary | ICD-10-CM | POA: Diagnosis not present

## 2018-02-06 ENCOUNTER — Telehealth: Payer: Self-pay | Admitting: Internal Medicine

## 2018-02-06 ENCOUNTER — Other Ambulatory Visit: Payer: Self-pay | Admitting: *Deleted

## 2018-02-06 MED ORDER — AMLODIPINE BESY-BENAZEPRIL HCL 5-10 MG PO CAPS: ORAL_CAPSULE | ORAL | 1 refills | Status: DC

## 2018-02-06 MED ORDER — AMLODIPINE BESY-BENAZEPRIL HCL 5-10 MG PO CAPS: 1.0000 | ORAL_CAPSULE | Freq: Every day | ORAL | 0 refills | Status: DC

## 2018-02-06 NOTE — Telephone Encounter (Signed)
Copied from [place] [id number]. Topic: Quick Communication - Rx Refill/Question >> Feb 06, 2018  3:21 PM Bea Graff, NT wrote: Medication: amLODipine-benazepril   Has the patient contacted their pharmacy? Yes.     (Agent: If no, request that the patient contact the pharmacy for the refill.)   Preferred Pharmacy (with phone number or street name): [facility]. She is also requesting a partial refill sent to [facility] in [place]. She is completely out   Agent: Please be advised that RX refills may take up to 3 business days. We ask that you follow-up with your pharmacy.

## 2018-02-06 NOTE — Progress Notes (Signed)
Temporary supply given until mail order arrives.

## 2018-02-07 DIAGNOSIS — Z1389 Encounter for screening for other disorder: Secondary | ICD-10-CM | POA: Diagnosis not present

## 2018-02-07 DIAGNOSIS — Z79890 Hormone replacement therapy (postmenopausal): Secondary | ICD-10-CM | POA: Diagnosis not present

## 2018-02-07 DIAGNOSIS — Z780 Asymptomatic menopausal state: Secondary | ICD-10-CM | POA: Diagnosis not present

## 2018-02-07 DIAGNOSIS — Z130 Encounter for screening for diseases of the blood and blood-forming organs and certain disorders involving the immune mechanism: Secondary | ICD-10-CM | POA: Diagnosis not present

## 2018-02-07 DIAGNOSIS — Z6832 Body mass index (BMI) 32.0-32.9, adult: Secondary | ICD-10-CM | POA: Diagnosis not present

## 2018-02-07 DIAGNOSIS — Z01419 Encounter for gynecological examination (general) (routine) without abnormal findings: Secondary | ICD-10-CM | POA: Diagnosis not present

## 2018-02-14 DIAGNOSIS — N951 Menopausal and female climacteric states: Secondary | ICD-10-CM | POA: Diagnosis not present

## 2018-02-14 DIAGNOSIS — R635 Abnormal weight gain: Secondary | ICD-10-CM | POA: Diagnosis not present

## 2018-02-17 DIAGNOSIS — M2550 Pain in unspecified joint: Secondary | ICD-10-CM | POA: Diagnosis not present

## 2018-02-17 DIAGNOSIS — G479 Sleep disorder, unspecified: Secondary | ICD-10-CM | POA: Diagnosis not present

## 2018-02-17 DIAGNOSIS — E782 Mixed hyperlipidemia: Secondary | ICD-10-CM | POA: Diagnosis not present

## 2018-02-17 DIAGNOSIS — Z1339 Encounter for screening examination for other mental health and behavioral disorders: Secondary | ICD-10-CM | POA: Diagnosis not present

## 2018-02-17 DIAGNOSIS — Z1331 Encounter for screening for depression: Secondary | ICD-10-CM | POA: Diagnosis not present

## 2018-02-17 DIAGNOSIS — I10 Essential (primary) hypertension: Secondary | ICD-10-CM | POA: Diagnosis not present

## 2018-02-22 DIAGNOSIS — I10 Essential (primary) hypertension: Secondary | ICD-10-CM | POA: Diagnosis not present

## 2018-02-22 DIAGNOSIS — E782 Mixed hyperlipidemia: Secondary | ICD-10-CM | POA: Diagnosis not present

## 2018-03-01 DIAGNOSIS — E782 Mixed hyperlipidemia: Secondary | ICD-10-CM | POA: Diagnosis not present

## 2018-03-01 DIAGNOSIS — E669 Obesity, unspecified: Secondary | ICD-10-CM | POA: Diagnosis not present

## 2018-03-01 DIAGNOSIS — Z713 Dietary counseling and surveillance: Secondary | ICD-10-CM | POA: Diagnosis not present

## 2018-03-06 DIAGNOSIS — Z85820 Personal history of malignant melanoma of skin: Secondary | ICD-10-CM | POA: Diagnosis not present

## 2018-03-06 DIAGNOSIS — L570 Actinic keratosis: Secondary | ICD-10-CM | POA: Diagnosis not present

## 2018-03-06 DIAGNOSIS — Z85828 Personal history of other malignant neoplasm of skin: Secondary | ICD-10-CM | POA: Diagnosis not present

## 2018-03-06 DIAGNOSIS — D1801 Hemangioma of skin and subcutaneous tissue: Secondary | ICD-10-CM | POA: Diagnosis not present

## 2018-03-06 DIAGNOSIS — L738 Other specified follicular disorders: Secondary | ICD-10-CM | POA: Diagnosis not present

## 2018-03-08 DIAGNOSIS — I10 Essential (primary) hypertension: Secondary | ICD-10-CM | POA: Diagnosis not present

## 2018-03-15 DIAGNOSIS — E782 Mixed hyperlipidemia: Secondary | ICD-10-CM | POA: Diagnosis not present

## 2018-03-15 DIAGNOSIS — I10 Essential (primary) hypertension: Secondary | ICD-10-CM | POA: Diagnosis not present

## 2018-03-17 DIAGNOSIS — L0390 Cellulitis, unspecified: Secondary | ICD-10-CM | POA: Diagnosis not present

## 2018-03-21 DIAGNOSIS — C4372 Malignant melanoma of left lower limb, including hip: Secondary | ICD-10-CM | POA: Diagnosis not present

## 2018-03-22 DIAGNOSIS — I10 Essential (primary) hypertension: Secondary | ICD-10-CM | POA: Diagnosis not present

## 2018-03-22 DIAGNOSIS — E782 Mixed hyperlipidemia: Secondary | ICD-10-CM | POA: Diagnosis not present

## 2018-03-30 DIAGNOSIS — I10 Essential (primary) hypertension: Secondary | ICD-10-CM | POA: Diagnosis not present

## 2018-04-06 DIAGNOSIS — I10 Essential (primary) hypertension: Secondary | ICD-10-CM | POA: Diagnosis not present

## 2018-04-10 DIAGNOSIS — C4372 Malignant melanoma of left lower limb, including hip: Secondary | ICD-10-CM | POA: Diagnosis not present

## 2018-04-12 DIAGNOSIS — K5900 Constipation, unspecified: Secondary | ICD-10-CM | POA: Diagnosis not present

## 2018-04-12 DIAGNOSIS — I10 Essential (primary) hypertension: Secondary | ICD-10-CM | POA: Diagnosis not present

## 2018-04-12 DIAGNOSIS — E782 Mixed hyperlipidemia: Secondary | ICD-10-CM | POA: Diagnosis not present

## 2018-04-19 DIAGNOSIS — E782 Mixed hyperlipidemia: Secondary | ICD-10-CM | POA: Diagnosis not present

## 2018-04-19 DIAGNOSIS — I10 Essential (primary) hypertension: Secondary | ICD-10-CM | POA: Diagnosis not present

## 2018-04-26 ENCOUNTER — Telehealth: Payer: Self-pay | Admitting: Family Medicine

## 2018-04-26 DIAGNOSIS — I10 Essential (primary) hypertension: Secondary | ICD-10-CM | POA: Diagnosis not present

## 2018-04-26 NOTE — Telephone Encounter (Signed)
Copied from [place] [id number]. Topic: Appointment Scheduling - Scheduling Inquiry for Clinic >> Apr 26, 2018  8:29 AM Aurelio Brash B wrote: Reason for CRM: After watching the news, Pt is asking if Dr Raliegh Ip thinks she needs to have measles booster vaccine.

## 2018-04-28 NOTE — Telephone Encounter (Signed)
Left message to return call 

## 2018-05-03 DIAGNOSIS — I10 Essential (primary) hypertension: Secondary | ICD-10-CM | POA: Diagnosis not present

## 2018-05-03 NOTE — Telephone Encounter (Signed)
Message routed to Dr. Marthann Schiller nurse

## 2018-05-03 NOTE — Telephone Encounter (Signed)
Patient calling back for [facility] regarding the measles vaccine. Please call her.

## 2018-05-03 NOTE — Telephone Encounter (Signed)
Pt was informed that as long as she had 2 doses of the measles she will be fine.Pt verbalized understanding.

## 2018-05-10 DIAGNOSIS — E782 Mixed hyperlipidemia: Secondary | ICD-10-CM | POA: Diagnosis not present

## 2018-05-10 DIAGNOSIS — I10 Essential (primary) hypertension: Secondary | ICD-10-CM | POA: Diagnosis not present

## 2018-05-17 DIAGNOSIS — E663 Overweight: Secondary | ICD-10-CM | POA: Diagnosis not present

## 2018-05-17 DIAGNOSIS — I10 Essential (primary) hypertension: Secondary | ICD-10-CM | POA: Diagnosis not present

## 2018-05-17 DIAGNOSIS — E782 Mixed hyperlipidemia: Secondary | ICD-10-CM | POA: Diagnosis not present

## 2018-05-18 DIAGNOSIS — C439 Malignant melanoma of skin, unspecified: Secondary | ICD-10-CM | POA: Diagnosis not present

## 2018-05-22 DIAGNOSIS — I890 Lymphedema, not elsewhere classified: Secondary | ICD-10-CM | POA: Diagnosis not present

## 2018-05-22 DIAGNOSIS — C4372 Malignant melanoma of left lower limb, including hip: Secondary | ICD-10-CM | POA: Diagnosis not present

## 2018-05-26 DIAGNOSIS — C439 Malignant melanoma of skin, unspecified: Secondary | ICD-10-CM | POA: Diagnosis not present

## 2018-05-31 DIAGNOSIS — I10 Essential (primary) hypertension: Secondary | ICD-10-CM | POA: Diagnosis not present

## 2018-06-02 DIAGNOSIS — Z85828 Personal history of other malignant neoplasm of skin: Secondary | ICD-10-CM | POA: Diagnosis not present

## 2018-06-02 DIAGNOSIS — L821 Other seborrheic keratosis: Secondary | ICD-10-CM | POA: Diagnosis not present

## 2018-06-02 DIAGNOSIS — Z85820 Personal history of malignant melanoma of skin: Secondary | ICD-10-CM | POA: Diagnosis not present

## 2018-06-02 DIAGNOSIS — L820 Inflamed seborrheic keratosis: Secondary | ICD-10-CM | POA: Diagnosis not present

## 2018-06-02 DIAGNOSIS — D1801 Hemangioma of skin and subcutaneous tissue: Secondary | ICD-10-CM | POA: Diagnosis not present

## 2018-06-04 DIAGNOSIS — C439 Malignant melanoma of skin, unspecified: Secondary | ICD-10-CM | POA: Diagnosis not present

## 2018-06-07 DIAGNOSIS — E782 Mixed hyperlipidemia: Secondary | ICD-10-CM | POA: Diagnosis not present

## 2018-06-07 DIAGNOSIS — I10 Essential (primary) hypertension: Secondary | ICD-10-CM | POA: Diagnosis not present

## 2018-06-07 DIAGNOSIS — N951 Menopausal and female climacteric states: Secondary | ICD-10-CM | POA: Diagnosis not present

## 2018-06-12 DIAGNOSIS — R6882 Decreased libido: Secondary | ICD-10-CM | POA: Diagnosis not present

## 2018-06-12 DIAGNOSIS — M2550 Pain in unspecified joint: Secondary | ICD-10-CM | POA: Diagnosis not present

## 2018-06-12 DIAGNOSIS — G479 Sleep disorder, unspecified: Secondary | ICD-10-CM | POA: Diagnosis not present

## 2018-06-12 DIAGNOSIS — N951 Menopausal and female climacteric states: Secondary | ICD-10-CM | POA: Diagnosis not present

## 2018-06-14 DIAGNOSIS — C439 Malignant melanoma of skin, unspecified: Secondary | ICD-10-CM | POA: Diagnosis not present

## 2018-06-21 DIAGNOSIS — I10 Essential (primary) hypertension: Secondary | ICD-10-CM | POA: Diagnosis not present

## 2018-06-26 DIAGNOSIS — C4372 Malignant melanoma of left lower limb, including hip: Secondary | ICD-10-CM | POA: Diagnosis not present

## 2018-07-11 DIAGNOSIS — R6882 Decreased libido: Secondary | ICD-10-CM | POA: Diagnosis not present

## 2018-07-11 DIAGNOSIS — G479 Sleep disorder, unspecified: Secondary | ICD-10-CM | POA: Diagnosis not present

## 2018-07-11 DIAGNOSIS — M2550 Pain in unspecified joint: Secondary | ICD-10-CM | POA: Diagnosis not present

## 2018-07-11 DIAGNOSIS — N951 Menopausal and female climacteric states: Secondary | ICD-10-CM | POA: Diagnosis not present

## 2018-07-19 DIAGNOSIS — G479 Sleep disorder, unspecified: Secondary | ICD-10-CM | POA: Diagnosis not present

## 2018-07-19 DIAGNOSIS — C4372 Malignant melanoma of left lower limb, including hip: Secondary | ICD-10-CM | POA: Diagnosis not present

## 2018-07-19 DIAGNOSIS — R6882 Decreased libido: Secondary | ICD-10-CM | POA: Diagnosis not present

## 2018-07-19 DIAGNOSIS — I10 Essential (primary) hypertension: Secondary | ICD-10-CM | POA: Diagnosis not present

## 2018-07-19 DIAGNOSIS — E663 Overweight: Secondary | ICD-10-CM | POA: Diagnosis not present

## 2018-08-07 ENCOUNTER — Other Ambulatory Visit: Payer: Self-pay | Admitting: Internal Medicine

## 2018-08-16 DIAGNOSIS — M2550 Pain in unspecified joint: Secondary | ICD-10-CM | POA: Diagnosis not present

## 2018-09-04 DIAGNOSIS — L570 Actinic keratosis: Secondary | ICD-10-CM | POA: Diagnosis not present

## 2018-09-04 DIAGNOSIS — Z85828 Personal history of other malignant neoplasm of skin: Secondary | ICD-10-CM | POA: Diagnosis not present

## 2018-09-04 DIAGNOSIS — I890 Lymphedema, not elsewhere classified: Secondary | ICD-10-CM | POA: Diagnosis not present

## 2018-09-04 DIAGNOSIS — L738 Other specified follicular disorders: Secondary | ICD-10-CM | POA: Diagnosis not present

## 2018-09-04 DIAGNOSIS — Z85820 Personal history of malignant melanoma of skin: Secondary | ICD-10-CM | POA: Diagnosis not present

## 2018-09-04 DIAGNOSIS — C4372 Malignant melanoma of left lower limb, including hip: Secondary | ICD-10-CM | POA: Diagnosis not present

## 2018-09-04 DIAGNOSIS — D225 Melanocytic nevi of trunk: Secondary | ICD-10-CM | POA: Diagnosis not present

## 2018-09-13 DIAGNOSIS — G479 Sleep disorder, unspecified: Secondary | ICD-10-CM | POA: Diagnosis not present

## 2018-09-13 DIAGNOSIS — R6882 Decreased libido: Secondary | ICD-10-CM | POA: Diagnosis not present

## 2018-09-21 DIAGNOSIS — N951 Menopausal and female climacteric states: Secondary | ICD-10-CM | POA: Diagnosis not present

## 2018-09-21 DIAGNOSIS — G479 Sleep disorder, unspecified: Secondary | ICD-10-CM | POA: Diagnosis not present

## 2018-09-21 DIAGNOSIS — I10 Essential (primary) hypertension: Secondary | ICD-10-CM | POA: Diagnosis not present

## 2018-09-21 DIAGNOSIS — R232 Flushing: Secondary | ICD-10-CM | POA: Diagnosis not present

## 2018-09-27 DIAGNOSIS — Z23 Encounter for immunization: Secondary | ICD-10-CM | POA: Diagnosis not present

## 2018-10-17 ENCOUNTER — Other Ambulatory Visit: Payer: Self-pay | Admitting: Gynecology

## 2018-10-17 DIAGNOSIS — Z1231 Encounter for screening mammogram for malignant neoplasm of breast: Secondary | ICD-10-CM

## 2018-10-19 DIAGNOSIS — M2550 Pain in unspecified joint: Secondary | ICD-10-CM | POA: Diagnosis not present

## 2018-10-19 DIAGNOSIS — I10 Essential (primary) hypertension: Secondary | ICD-10-CM | POA: Diagnosis not present

## 2018-10-19 DIAGNOSIS — E782 Mixed hyperlipidemia: Secondary | ICD-10-CM | POA: Diagnosis not present

## 2018-11-03 DIAGNOSIS — E663 Overweight: Secondary | ICD-10-CM | POA: Diagnosis not present

## 2018-11-03 DIAGNOSIS — M2550 Pain in unspecified joint: Secondary | ICD-10-CM | POA: Diagnosis not present

## 2018-11-23 DIAGNOSIS — E663 Overweight: Secondary | ICD-10-CM | POA: Diagnosis not present

## 2018-11-23 DIAGNOSIS — E782 Mixed hyperlipidemia: Secondary | ICD-10-CM | POA: Diagnosis not present

## 2018-11-29 ENCOUNTER — Ambulatory Visit
Admission: RE | Admit: 2018-11-29 | Discharge: 2018-11-29 | Disposition: A | Discharge status: Home / Self Care | Payer: BLUE CROSS/BLUE SHIELD | Source: Ambulatory Visit | Attending: Gynecology | Admitting: Gynecology

## 2018-11-29 DIAGNOSIS — Z1231 Encounter for screening mammogram for malignant neoplasm of breast: Secondary | ICD-10-CM

## 2018-12-05 DIAGNOSIS — C4372 Malignant melanoma of left lower limb, including hip: Secondary | ICD-10-CM | POA: Diagnosis not present

## 2018-12-05 DIAGNOSIS — I890 Lymphedema, not elsewhere classified: Secondary | ICD-10-CM | POA: Diagnosis not present

## 2019-02-02 ENCOUNTER — Encounter: Payer: BLUE CROSS/BLUE SHIELD | Admitting: Family Medicine

## 2019-02-07 ENCOUNTER — Ambulatory Visit: Payer: BLUE CROSS/BLUE SHIELD | Admitting: Family Medicine

## 2019-02-07 ENCOUNTER — Encounter: Payer: Self-pay | Admitting: Family Medicine

## 2019-02-07 VITALS — BP 120/80 | HR 80 | Temp 98.0°F | Wt 156.8 lb

## 2019-02-07 DIAGNOSIS — Z1159 Encounter for screening for other viral diseases: Secondary | ICD-10-CM

## 2019-02-07 DIAGNOSIS — Z114 Encounter for screening for human immunodeficiency virus [HIV]: Secondary | ICD-10-CM

## 2019-02-07 DIAGNOSIS — E785 Hyperlipidemia, unspecified: Secondary | ICD-10-CM | POA: Diagnosis not present

## 2019-02-07 DIAGNOSIS — I10 Essential (primary) hypertension: Secondary | ICD-10-CM | POA: Diagnosis not present

## 2019-02-07 LAB — CBC WITH DIFFERENTIAL/PLATELET
BASOS PCT: 0.6 % (ref 0.0–3.0)
Basophils Absolute: 0.1 10*3/uL (ref 0.0–0.1)
Eosinophils Absolute: 0.1 10*3/uL (ref 0.0–0.7)
Eosinophils Relative: 1.3 % (ref 0.0–5.0)
HCT: 44 % (ref 36.0–46.0)
Hemoglobin: 15.1 g/dL — ABNORMAL HIGH (ref 12.0–15.0)
LYMPHS ABS: 1.9 10*3/uL (ref 0.7–4.0)
LYMPHS PCT: 20.4 % (ref 12.0–46.0)
MCHC: 34.3 g/dL (ref 30.0–36.0)
MCV: 88 fl (ref 78.0–100.0)
MONO ABS: 0.5 10*3/uL (ref 0.1–1.0)
Monocytes Relative: 5.8 % (ref 3.0–12.0)
NEUTROS ABS: 6.6 10*3/uL (ref 1.4–7.7)
NEUTROS PCT: 71.9 % (ref 43.0–77.0)
Platelets: 263 10*3/uL (ref 150.0–400.0)
RBC: 5 Mil/uL (ref 3.87–5.11)
RDW: 12.7 % (ref 11.5–15.5)
WBC: 9.2 10*3/uL (ref 4.0–10.5)

## 2019-02-07 LAB — COMPREHENSIVE METABOLIC PANEL
ALT: 21 U/L (ref 0–35)
AST: 23 U/L (ref 0–37)
Albumin: 4.3 g/dL (ref 3.5–5.2)
Alkaline Phosphatase: 56 U/L (ref 39–117)
BUN: 21 mg/dL (ref 6–23)
CHLORIDE: 103 meq/L (ref 96–112)
CO2: 27 mEq/L (ref 19–32)
Calcium: 9.5 mg/dL (ref 8.4–10.5)
Creatinine, Ser: 0.8 mg/dL (ref 0.40–1.20)
GFR: 74.62 mL/min (ref >60.00)
Glucose, Bld: 87 mg/dL (ref 70–99)
POTASSIUM: 4.8 meq/L (ref 3.5–5.1)
SODIUM: 138 meq/L (ref 135–145)
Total Bilirubin: 0.5 mg/dL (ref 0.2–1.2)
Total Protein: 6.9 g/dL (ref 6.0–8.3)

## 2019-02-07 LAB — LIPID PANEL
CHOL/HDL RATIO: 5
Cholesterol: 209 mg/dL — ABNORMAL HIGH (ref 0–200)
HDL: 46.1 mg/dL (ref >39.00)
LDL CALC: 146 mg/dL — AB (ref 0–99)
NONHDL: 162.89
Triglycerides: 85 mg/dL (ref 0.0–149.0)
VLDL: 17 mg/dL (ref 0.0–40.0)

## 2019-02-07 LAB — TSH: TSH: 1.44 u[IU]/mL (ref 0.35–4.50)

## 2019-02-07 NOTE — Addendum Note (Signed)
Addended by: Caren Macadam on: 02/07/2019 08:46 AM   Modules accepted: Level of Service

## 2019-02-07 NOTE — Addendum Note (Signed)
Addended by: Gwynne Edinger on: 02/07/2019 08:48 AM   Modules accepted: Orders

## 2019-02-07 NOTE — Addendum Note (Signed)
Addended by: Gwynne Edinger on: 02/07/2019 08:49 AM   Modules accepted: Orders

## 2019-02-07 NOTE — Patient Instructions (Signed)
Give me update on blood pressures in a week - let me know how running specifically a couple of hours after taking medication. We will determine change in medication pending your update through mychart.

## 2019-02-07 NOTE — Progress Notes (Signed)
Dawn Cole DOB: 03/17/1966 Encounter date: 02/07/2019  This is a 53 y.o. female who presents to establish care. Chief Complaint  Patient presents with  . Transitions Of Care    History of present illness: No specific concerns today.   HTN:on amlodipine-benazepril 5-'10mg'$  daily-does not regularly check pressures at home. Does mail order pharmacy. Has 2 weeks left of medication. Has been doing beach body and working on healthy eating which has helped with weight loss.   Hyperlipidemia: not been on medication in the past.   Melanoma (left calf): Follows with dermatology. Had some issues with lymphedema after surgery; has been working with them and this is doing better.   Genetic testing due to significant family hx of cancer which was negative.   Follows with gyn:E[facility] - has follow up March-April.   Past Medical History:  Diagnosis Date  . 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 Family history of brain cancer   . Family history of colon cancer   . Family history of melanoma   . Family history of ovarian cancer   . Family history of pancreatic cancer   . HYPERLIPIDEMIA 10/09/2007  . HYPERTENSION 01/09/2009  . Malignant melanoma ([place])    Past Surgical History:  Procedure Laterality Date  . ABDOMINAL HYSTERECTOMY     heavy bleeding/irregular periods; fibroids. no cancer concerns.  . COLONOSCOPY  02/2017  . HYSTEROSCOPY     x2  . LIPOMA EXCISION Left 08/02/2017   Procedure: WIDE LOCAL EXCISION WITH ADVANCEMENT FLAP CLOSURE LEFT LOWER LEG AND SENTINEL LYMPH NODE MAPPING AND BIOPOSY;  Surgeon: [name], MD;  Location: [facility];  Service: General;  Laterality: Left;  GENERAL AND LOCAL  . MELANOMA EXCISION  2018   Allergies  Allergen Reactions  . Adhesive [Tape] Other (See Comments)    BANDAIDS- RIPS SKIN OFF    Current Meds  Medication Sig  . amLODipine-benazepril (LOTREL) 5-10 MG capsule TAKE 1 BY MOUTH DAILY  . calcium carbonate (OS-CAL) 600 MG TABS tablet  Take 600 mg by mouth daily.  Marland Kitchen estradiol (VIVELLE-DOT) 0.075 MG/24HR Place 0.5 patches onto the skin 2 (two) times a week.   . tolterodine (DETROL LA) 2 MG 24 hr capsule Take 2 mg by mouth daily.   Marland Kitchen triamcinolone cream (KENALOG) 0.1 % Apply 1 application topically 2 (two) times daily.   Social History   Tobacco Use  . Smoking status: Never Smoker  . Smokeless tobacco: Never Used  Substance Use Topics  . Alcohol use: No   Family History  Problem Relation Age of Onset  . Colon cancer Mother        dx in her late 45s, mets to pancreas, liver and lungs  . High blood pressure Mother   . High Cholesterol Mother   . Kidney Stones Mother   . Brain cancer Father 50  . COPD Sister   . Dementia Sister 33  . Lung cancer Brother 40       heavy smoker  . Lung cancer Maternal Uncle   . Colon cancer Maternal Grandfather 31  . Kidney Stones Sister   . COPD Sister   . Drug abuse Sister   . Pancreatic cancer Brother 37       maternal half brother - was exposed to agent orange  . Ovarian cancer Maternal Aunt        dx in her late 34s  . Lung cancer Maternal Aunt   . Melanoma Maternal  Aunt   . Multiple myeloma Cousin        maternal first cousin  . Kidney cancer Cousin   . Heart failure Paternal Grandmother 83     Review of Systems  Constitutional: Negative for activity change, appetite change, chills, fatigue, fever and unexpected weight change.  HENT: Negative for congestion, ear pain, hearing loss, sinus pressure, sinus pain, sore throat and trouble swallowing.   Eyes: Negative for pain and visual disturbance.  Respiratory: Negative for cough, chest tightness, shortness of breath and wheezing.   Cardiovascular: Negative for chest pain, palpitations and leg swelling.  Gastrointestinal: Negative for abdominal pain, blood in stool, constipation, diarrhea, nausea and vomiting.  Genitourinary: Negative for difficulty urinating and menstrual problem.  Musculoskeletal: Negative for  arthralgias and back pain.  Skin: Negative for rash.  Neurological: Negative for dizziness, weakness, numbness and headaches.  Hematological: Negative for adenopathy. Does not bruise/bleed easily.  Psychiatric/Behavioral: Negative for sleep disturbance and suicidal ideas. The patient is not nervous/anxious.     Objective:  BP 120/80 (BP Location: Left Arm, Patient Position: Sitting, Cuff Size: Normal)   Pulse 80   Temp 98 F (36.7 C) (Oral)   Wt 156 lb 12.8 oz (71.1 kg)   SpO2 99%   BMI 26.91 kg/m   Weight: 156 lb 12.8 oz (71.1 kg)   BP Readings from Last 3 Encounters:  02/07/19 120/80  01/31/18 120/80  01/02/18 100/60   Wt Readings from Last 3 Encounters:  02/07/19 156 lb 12.8 oz (71.1 kg)  01/31/18 183 lb (83 kg)  01/02/18 179 lb 11.2 oz (81.5 kg)    Physical Exam Constitutional:      General: She is not in acute distress.    Appearance: She is well-developed.  HENT:     Head: Normocephalic and atraumatic.     Right Ear: External ear normal.     Left Ear: External ear normal.     Mouth/Throat:     Pharynx: No oropharyngeal exudate.  Eyes:     Conjunctiva/sclera: Conjunctivae normal.     Pupils: Pupils are equal, round, and reactive to light.  Neck:     Musculoskeletal: Normal range of motion and neck supple.     Thyroid: No thyromegaly.  Cardiovascular:     Rate and Rhythm: Normal rate and regular rhythm.     Heart sounds: Normal heart sounds. No murmur. No friction rub. No gallop.   Pulmonary:     Effort: Pulmonary effort is normal.     Breath sounds: Normal breath sounds.  Abdominal:     General: Bowel sounds are normal. There is no distension.     Palpations: Abdomen is soft. There is no mass.     Tenderness: There is no abdominal tenderness. There is no guarding.     Hernia: No hernia is present.  Musculoskeletal: Normal range of motion.        General: No tenderness or deformity.  Lymphadenopathy:     Cervical: No cervical adenopathy.  Skin:     General: Skin is warm and dry.     Findings: No rash.  Neurological:     Mental Status: She is alert and oriented to person, place, and time.     Deep Tendon Reflexes: Reflexes normal.     Reflex Scores:      Tricep reflexes are 2+ on the right side and 2+ on the left side.      Bicep reflexes are 2+ on the right side and 2+ on  the left side.      Brachioradialis reflexes are 2+ on the right side and 2+ on the left side.      Patellar reflexes are 2+ on the right side and 2+ on the left side. Psychiatric:        Speech: Speech normal.        Behavior: Behavior normal.        Thought Content: Thought content normal.     Assessment/Plan:  1. Essential hypertension Stable and will let me know how pressures look in next week as she would like to decrease bp medication if possible.  - Comprehensive metabolic panel; Future - CBC with Differential/Platelet; Future  2. Dyslipidemia  - Lipid panel; Future - TSH; Future  3. Encounter for hepatitis C screening test for low risk patient  - Hepatitis c antibody (reflex); Future  4. Encounter for screening for HIV  - HIV Antibody (routine testing w rflx); Future    Return in about 6 months (around 08/08/2019) for Chronic condition visit.  Micheline Rough, MD   Encouraged her to have specialists forward [place] notes from upcoming visits.

## 2019-02-08 ENCOUNTER — Encounter: Payer: Self-pay | Admitting: Family Medicine

## 2019-02-08 LAB — HEPATITIS C ANTIBODY
HEP C AB: NONREACTIVE
SIGNAL TO CUT-OFF: 0.01 (ref <1.00)

## 2019-02-08 LAB — HIV ANTIBODY (ROUTINE TESTING W REFLEX): HIV: NONREACTIVE

## 2019-03-02 ENCOUNTER — Encounter: Payer: Self-pay | Admitting: Family Medicine

## 2019-03-05 ENCOUNTER — Other Ambulatory Visit: Payer: Self-pay | Admitting: Family Medicine

## 2019-03-05 MED ORDER — AMLODIPINE BESY-BENAZEPRIL HCL 5-10 MG PO CAPS
ORAL_CAPSULE | ORAL | 1 refills | Status: DC
Start: 2019-03-05 — End: 2019-10-01

## 2019-04-04 DIAGNOSIS — H35033 Hypertensive retinopathy, bilateral: Secondary | ICD-10-CM | POA: Diagnosis not present

## 2019-04-16 DIAGNOSIS — N951 Menopausal and female climacteric states: Secondary | ICD-10-CM | POA: Diagnosis not present

## 2019-04-16 DIAGNOSIS — G479 Sleep disorder, unspecified: Secondary | ICD-10-CM | POA: Diagnosis not present

## 2019-04-16 DIAGNOSIS — R232 Flushing: Secondary | ICD-10-CM | POA: Diagnosis not present

## 2019-04-19 DIAGNOSIS — R232 Flushing: Secondary | ICD-10-CM | POA: Diagnosis not present

## 2019-04-19 DIAGNOSIS — G479 Sleep disorder, unspecified: Secondary | ICD-10-CM | POA: Diagnosis not present

## 2019-04-19 DIAGNOSIS — R6882 Decreased libido: Secondary | ICD-10-CM | POA: Diagnosis not present

## 2019-04-19 DIAGNOSIS — N951 Menopausal and female climacteric states: Secondary | ICD-10-CM | POA: Diagnosis not present

## 2019-04-27 DIAGNOSIS — R635 Abnormal weight gain: Secondary | ICD-10-CM | POA: Diagnosis not present

## 2019-05-08 DIAGNOSIS — Z6827 Body mass index (BMI) 27.0-27.9, adult: Secondary | ICD-10-CM | POA: Diagnosis not present

## 2019-05-08 DIAGNOSIS — I10 Essential (primary) hypertension: Secondary | ICD-10-CM | POA: Diagnosis not present

## 2019-05-08 DIAGNOSIS — E782 Mixed hyperlipidemia: Secondary | ICD-10-CM | POA: Diagnosis not present

## 2019-06-06 DIAGNOSIS — Z6827 Body mass index (BMI) 27.0-27.9, adult: Secondary | ICD-10-CM | POA: Diagnosis not present

## 2019-06-06 DIAGNOSIS — Z780 Asymptomatic menopausal state: Secondary | ICD-10-CM | POA: Diagnosis not present

## 2019-06-06 DIAGNOSIS — N393 Stress incontinence (female) (male): Secondary | ICD-10-CM | POA: Diagnosis not present

## 2019-06-06 DIAGNOSIS — Z01411 Encounter for gynecological examination (general) (routine) with abnormal findings: Secondary | ICD-10-CM | POA: Diagnosis not present

## 2019-06-06 DIAGNOSIS — Z130 Encounter for screening for diseases of the blood and blood-forming organs and certain disorders involving the immune mechanism: Secondary | ICD-10-CM | POA: Diagnosis not present

## 2019-06-08 DIAGNOSIS — Z6827 Body mass index (BMI) 27.0-27.9, adult: Secondary | ICD-10-CM | POA: Diagnosis not present

## 2019-06-08 DIAGNOSIS — E782 Mixed hyperlipidemia: Secondary | ICD-10-CM | POA: Diagnosis not present

## 2019-08-01 DIAGNOSIS — R232 Flushing: Secondary | ICD-10-CM | POA: Diagnosis not present

## 2019-08-01 DIAGNOSIS — R6882 Decreased libido: Secondary | ICD-10-CM | POA: Diagnosis not present

## 2019-08-01 DIAGNOSIS — N951 Menopausal and female climacteric states: Secondary | ICD-10-CM | POA: Diagnosis not present

## 2019-08-06 DIAGNOSIS — R232 Flushing: Secondary | ICD-10-CM | POA: Diagnosis not present

## 2019-08-06 DIAGNOSIS — G479 Sleep disorder, unspecified: Secondary | ICD-10-CM | POA: Diagnosis not present

## 2019-08-06 DIAGNOSIS — N951 Menopausal and female climacteric states: Secondary | ICD-10-CM | POA: Diagnosis not present

## 2019-10-01 ENCOUNTER — Other Ambulatory Visit: Payer: Self-pay | Admitting: Family Medicine

## 2019-10-01 DIAGNOSIS — D225 Melanocytic nevi of trunk: Secondary | ICD-10-CM | POA: Diagnosis not present

## 2019-10-01 DIAGNOSIS — L570 Actinic keratosis: Secondary | ICD-10-CM | POA: Diagnosis not present

## 2019-10-01 DIAGNOSIS — I10 Essential (primary) hypertension: Secondary | ICD-10-CM

## 2019-10-01 DIAGNOSIS — L814 Other melanin hyperpigmentation: Secondary | ICD-10-CM | POA: Diagnosis not present

## 2019-10-01 DIAGNOSIS — Z85828 Personal history of other malignant neoplasm of skin: Secondary | ICD-10-CM | POA: Diagnosis not present

## 2019-10-01 DIAGNOSIS — Z85820 Personal history of malignant melanoma of skin: Secondary | ICD-10-CM | POA: Diagnosis not present

## 2019-10-01 MED ORDER — AMLODIPINE BESY-BENAZEPRIL HCL 5-10 MG PO CAPS: ORAL_CAPSULE | ORAL | 0 refills | Status: DC

## 2019-10-01 NOTE — Telephone Encounter (Signed)
Requested medication (s) are due for refill today: yes  Requested medication (s) are on the active medication list: yes  Last refill:  03/05/2019  Future visit scheduled: no  Notes to clinic:  Only has 2 pills left Overdue for office visit    Requested Prescriptions  Pending Prescriptions Disp Refills   amLODipine-benazepril (LOTREL) 5-10 MG capsule 90 capsule 1    Sig: TAKE 1 BY MOUTH DAILY     Cardiovascular: CCB + ACEI Combos Failed - 10/01/2019  9:53 AM      Failed - Cr in normal range and within 180 days    Creatinine, Ser  Date Value Ref Range Status  02/07/2019 0.80 0.40 - 1.20 mg/dL Final         Failed - K in normal range and within 180 days    Potassium  Date Value Ref Range Status  02/07/2019 4.8 3.5 - 5.1 mEq/L Final         Failed - Valid encounter within last 6 months    Recent Outpatient Visits          7 months ago Essential hypertension   Therapist, [facility] at [facility], Steele Berg, MD   1 year ago Encounter for preventive health examination   Therapist, music at [facility], Doretha Sou, MD   1 year ago Skin rash   Therapist, music at [facility], Nickola Major, DO   2 years ago Encounter for preventive health examination   Therapist, music at [facility], Doretha Sou, MD   2 years ago [place] [facility], Alinda Sierras, MD             Passed - Patient is not pregnant      Passed - Last BP in normal range    BP Readings from Last 1 Encounters:  02/07/19 120/80

## 2019-10-01 NOTE — Telephone Encounter (Signed)
Medication Refill - Medication: amLODipine-benazepril (LOTREL) 5-10 MG capsule  Has the patient contacted their pharmacy? Yes - states pharmacy has been trying to get [place] to refill for a week now.  Pt only has 2 pills left. (Agent: If no, request that the patient contact the pharmacy for the refill.) (Agent: If yes, when and what did the pharmacy advise?)  Preferred Pharmacy (with phone number or street name):  [facility], [place] [phone] (Phone) [phone] (Fax)   Agent: Please be advised that RX refills may take up to 3 business days. We ask that you follow-up with your pharmacy.

## 2019-10-01 NOTE — Telephone Encounter (Signed)
Refill sent in for 3 months. Pt was requested to have follow up 8/20 and this has not been completed. Pt will need to complete this visit for further refills.

## 2019-11-08 DIAGNOSIS — G479 Sleep disorder, unspecified: Secondary | ICD-10-CM | POA: Diagnosis not present

## 2019-11-08 DIAGNOSIS — N951 Menopausal and female climacteric states: Secondary | ICD-10-CM | POA: Diagnosis not present

## 2019-11-19 DIAGNOSIS — N951 Menopausal and female climacteric states: Secondary | ICD-10-CM | POA: Diagnosis not present

## 2019-11-19 DIAGNOSIS — R635 Abnormal weight gain: Secondary | ICD-10-CM | POA: Diagnosis not present

## 2019-11-19 DIAGNOSIS — G479 Sleep disorder, unspecified: Secondary | ICD-10-CM | POA: Diagnosis not present

## 2019-11-19 DIAGNOSIS — R232 Flushing: Secondary | ICD-10-CM | POA: Diagnosis not present

## 2019-12-18 DIAGNOSIS — Z6826 Body mass index (BMI) 26.0-26.9, adult: Secondary | ICD-10-CM | POA: Diagnosis not present

## 2019-12-18 DIAGNOSIS — I10 Essential (primary) hypertension: Secondary | ICD-10-CM | POA: Diagnosis not present

## 2019-12-19 ENCOUNTER — Other Ambulatory Visit: Payer: Self-pay | Admitting: Gynecology

## 2019-12-19 DIAGNOSIS — Z1231 Encounter for screening mammogram for malignant neoplasm of breast: Secondary | ICD-10-CM

## 2020-01-02 ENCOUNTER — Other Ambulatory Visit: Payer: Self-pay

## 2020-01-02 ENCOUNTER — Encounter: Payer: Self-pay | Admitting: Family Medicine

## 2020-01-02 ENCOUNTER — Ambulatory Visit (INDEPENDENT_AMBULATORY_CARE_PROVIDER_SITE_OTHER): Payer: BC Managed Care – PPO | Admitting: Family Medicine

## 2020-01-02 VITALS — BP 121/80 | HR 75 | Wt 148.5 lb

## 2020-01-02 DIAGNOSIS — I10 Essential (primary) hypertension: Secondary | ICD-10-CM | POA: Diagnosis not present

## 2020-01-02 DIAGNOSIS — E785 Hyperlipidemia, unspecified: Secondary | ICD-10-CM | POA: Diagnosis not present

## 2020-01-02 MED ORDER — AMLODIPINE BESY-BENAZEPRIL HCL 5-10 MG PO CAPS: ORAL_CAPSULE | ORAL | 1 refills | Status: DC

## 2020-01-02 NOTE — Progress Notes (Signed)
Virtual Visit via Video Note  I connected with Dawn Cole  on 01/02/20 at 10:15 AM EST by a video enabled telemedicine application and verified that I am speaking with the correct person using two identifiers.  Location patient: home Location provider:work or home office Persons participating in the virtual visit: patient, provider  I discussed the limitations of evaluation and management by telemedicine and the availability of in person appointments. The patient expressed understanding and agreed to proceed.   Dawn Cole DOB: [birth year] Encounter date: 01/02/2020  This is a 54 y.o. female who presents with Chief Complaint  Patient presents with  . Follow-up    History of present illness: Last visit was in February/2020 for establishing care.  Working from home now; so home most of the time. Enjoys not having commute. Ends work day earlier.   Hypertension: Amlodipine-benazepril 5-10 mg daily.  At last visit she was regularly doing beach body and working on healthy eating as well as weight loss. Doing new work outs now with [employer]. BP usually running around 115-120/75-80's.   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 Saw derm in last 3 months. Has appointment in March. Some days at end of day will get some swelling but this is doing better now. Does pump at night and then it improves.   Follows with gynecology regularly [facility]). Last visit was in June 2020. Has mammogram scheduled in next month as well.   Allergies  Allergen Reactions  . Adhesive [Tape] Other (See Comments)    BANDAIDS- RIPS SKIN OFF    Current Meds  Medication Sig  . amLODipine-benazepril (LOTREL) 5-10 MG capsule TAKE 1 BY MOUTH DAILY  . calcium carbonate (OS-CAL) 600 MG TABS tablet Take 600 mg by mouth daily.  Marland Kitchen estradiol (VIVELLE-DOT) 0.075 MG/24HR Place 0.5 patches onto the skin 2 (two) times a week.   . tolterodine (DETROL LA) 2 MG 24 hr capsule Take 2 mg by mouth daily.    Marland Kitchen triamcinolone cream (KENALOG) 0.1 % Apply 1 application topically 2 (two) times daily.    Review of Systems  Constitutional: Negative for chills, fatigue and fever.  Respiratory: Negative for cough, chest tightness, shortness of breath and wheezing.   Cardiovascular: Negative for chest pain, palpitations and leg swelling.    Objective:  BP 121/80   Pulse 75   Wt 148 lb 8 oz (67.4 kg)   BMI 25.49 kg/m   Weight: 148 lb 8 oz (67.4 kg)   BP Readings from Last 3 Encounters:  01/02/20 121/80  02/07/19 120/80  01/31/18 120/80   Wt Readings from Last 3 Encounters:  01/02/20 148 lb 8 oz (67.4 kg)  02/07/19 156 lb 12.8 oz (71.1 kg)  01/31/18 183 lb (83 kg)    EXAM:  GENERAL: alert, oriented, appears well and in no acute distress  HEENT: atraumatic, conjunctiva clear, no obvious abnormalities on inspection of external nose and ears  NECK: normal movements of the head and neck  LUNGS: on inspection no signs of respiratory distress, breathing rate appears normal, no obvious gross SOB, gasping or wheezing  CV: no obvious cyanosis  MS: moves all visible extremities without noticeable abnormality  PSYCH/NEURO: pleasant and cooperative, no obvious depression or anxiety, speech and thought processing grossly intact   Assessment/Plan  1. Essential hypertension Well controlled with current medication. bloodwork when able.  - CBC with Differential/Platelet; Future - Comprehensive metabolic panel; Future - amLODipine-benazepril (LOTREL) 5-10 MG capsule; TAKE 1 BY  MOUTH DAILY  Dispense: 90 capsule; Refill: 1  2. Dyslipidemia She has continued to work on diet and exercise. Will recheck for baseline. - Comprehensive metabolic panel; Future - Lipid panel; Future    Return in about 6 months (around 07/01/2020) for physical exam.   I discussed the assessment and treatment plan with the patient. The patient was provided an opportunity to ask questions and all were answered. The  patient agreed with the plan and demonstrated an understanding of the instructions.   The patient was advised to call back or seek an in-person evaluation if the symptoms worsen or if the condition fails to improve as anticipated.  I provided 22 minutes of non-face-to-face time during this encounter.   Micheline Rough, MD

## 2020-01-03 ENCOUNTER — Telehealth: Payer: Self-pay | Admitting: *Deleted

## 2020-01-03 DIAGNOSIS — Z6826 Body mass index (BMI) 26.0-26.9, adult: Secondary | ICD-10-CM | POA: Diagnosis not present

## 2020-01-03 DIAGNOSIS — M2550 Pain in unspecified joint: Secondary | ICD-10-CM | POA: Diagnosis not present

## 2020-01-03 DIAGNOSIS — E782 Mixed hyperlipidemia: Secondary | ICD-10-CM | POA: Diagnosis not present

## 2020-01-03 NOTE — Telephone Encounter (Signed)
-----   Message from Caren Macadam, MD sent at 01/02/2020 10:31 AM EST ----- Please set up lab visit when convenient and then physical in 6 months time. She would like to use flex spending card for copay and wondered if she could do that over the phone? I told her I would ask since I don't know. Perhaps she can do this when here for blood draw?

## 2020-01-03 NOTE — Telephone Encounter (Signed)
I left a detailed message at the pts cell number to call back for a lab appt and physical exam as below.  Also advised the pt to call the billing office at [phone] or come in to the office in order to use the flex spending card.

## 2020-01-10 ENCOUNTER — Other Ambulatory Visit: Payer: Self-pay

## 2020-01-10 ENCOUNTER — Other Ambulatory Visit (INDEPENDENT_AMBULATORY_CARE_PROVIDER_SITE_OTHER): Payer: BC Managed Care – PPO

## 2020-01-10 DIAGNOSIS — I10 Essential (primary) hypertension: Secondary | ICD-10-CM | POA: Diagnosis not present

## 2020-01-10 DIAGNOSIS — E785 Hyperlipidemia, unspecified: Secondary | ICD-10-CM

## 2020-01-10 LAB — CBC WITH DIFFERENTIAL/PLATELET
Basophils Absolute: 0.1 10*3/uL (ref 0.0–0.1)
Basophils Relative: 0.7 % (ref 0.0–3.0)
Eosinophils Absolute: 0.3 10*3/uL (ref 0.0–0.7)
Eosinophils Relative: 3.4 % (ref 0.0–5.0)
HCT: 41.9 % (ref 36.0–46.0)
Hemoglobin: 14.1 g/dL (ref 12.0–15.0)
Lymphocytes Relative: 25.6 % (ref 12.0–46.0)
Lymphs Abs: 2.1 10*3/uL (ref 0.7–4.0)
MCHC: 33.8 g/dL (ref 30.0–36.0)
MCV: 89.8 fl (ref 78.0–100.0)
Monocytes Absolute: 0.5 10*3/uL (ref 0.1–1.0)
Monocytes Relative: 6.5 % (ref 3.0–12.0)
Neutro Abs: 5.1 10*3/uL (ref 1.4–7.7)
Neutrophils Relative %: 63.8 % (ref 43.0–77.0)
Platelets: 239 10*3/uL (ref 150.0–400.0)
RBC: 4.66 Mil/uL (ref 3.87–5.11)
RDW: 12.8 % (ref 11.5–15.5)
WBC: 8.1 10*3/uL (ref 4.0–10.5)

## 2020-01-10 LAB — LIPID PANEL
Cholesterol: 186 mg/dL (ref 0–200)
HDL: 51.8 mg/dL (ref >39.00)
LDL Cholesterol: 117 mg/dL — ABNORMAL HIGH (ref 0–99)
NonHDL: 134.67
Total CHOL/HDL Ratio: 4
Triglycerides: 86 mg/dL (ref 0.0–149.0)
VLDL: 17.2 mg/dL (ref 0.0–40.0)

## 2020-01-10 LAB — COMPREHENSIVE METABOLIC PANEL
ALT: 21 U/L (ref 0–35)
AST: 19 U/L (ref 0–37)
Albumin: 4.1 g/dL (ref 3.5–5.2)
Alkaline Phosphatase: 64 U/L (ref 39–117)
BUN: 21 mg/dL (ref 6–23)
CO2: 28 mEq/L (ref 19–32)
Calcium: 9.1 mg/dL (ref 8.4–10.5)
Chloride: 105 mEq/L (ref 96–112)
Creatinine, Ser: 0.76 mg/dL (ref 0.40–1.20)
GFR: 78.9 mL/min (ref >60.00)
Glucose, Bld: 84 mg/dL (ref 70–99)
Potassium: 4.4 mEq/L (ref 3.5–5.1)
Sodium: 137 mEq/L (ref 135–145)
Total Bilirubin: 0.4 mg/dL (ref 0.2–1.2)
Total Protein: 6.6 g/dL (ref 6.0–8.3)

## 2020-01-22 DIAGNOSIS — Z6827 Body mass index (BMI) 27.0-27.9, adult: Secondary | ICD-10-CM | POA: Diagnosis not present

## 2020-01-22 DIAGNOSIS — I10 Essential (primary) hypertension: Secondary | ICD-10-CM | POA: Diagnosis not present

## 2020-01-30 ENCOUNTER — Ambulatory Visit: Payer: BLUE CROSS/BLUE SHIELD

## 2020-02-11 DIAGNOSIS — I10 Essential (primary) hypertension: Secondary | ICD-10-CM | POA: Diagnosis not present

## 2020-02-11 DIAGNOSIS — E782 Mixed hyperlipidemia: Secondary | ICD-10-CM | POA: Diagnosis not present

## 2020-02-11 DIAGNOSIS — Z6826 Body mass index (BMI) 26.0-26.9, adult: Secondary | ICD-10-CM | POA: Diagnosis not present

## 2020-02-20 ENCOUNTER — Ambulatory Visit
Admission: RE | Admit: 2020-02-20 | Discharge: 2020-02-20 | Disposition: A | Discharge status: Home / Self Care | Payer: BC Managed Care – PPO | Source: Ambulatory Visit | Attending: Gynecology | Admitting: Gynecology

## 2020-02-20 ENCOUNTER — Other Ambulatory Visit: Payer: Self-pay

## 2020-02-20 DIAGNOSIS — Z1231 Encounter for screening mammogram for malignant neoplasm of breast: Secondary | ICD-10-CM

## 2020-02-25 DIAGNOSIS — Z6826 Body mass index (BMI) 26.0-26.9, adult: Secondary | ICD-10-CM | POA: Diagnosis not present

## 2020-02-25 DIAGNOSIS — M2550 Pain in unspecified joint: Secondary | ICD-10-CM | POA: Diagnosis not present

## 2020-02-25 DIAGNOSIS — N951 Menopausal and female climacteric states: Secondary | ICD-10-CM | POA: Diagnosis not present

## 2020-02-25 DIAGNOSIS — G479 Sleep disorder, unspecified: Secondary | ICD-10-CM | POA: Diagnosis not present

## 2020-02-25 DIAGNOSIS — R232 Flushing: Secondary | ICD-10-CM | POA: Diagnosis not present

## 2020-03-03 DIAGNOSIS — N951 Menopausal and female climacteric states: Secondary | ICD-10-CM | POA: Diagnosis not present

## 2020-03-03 DIAGNOSIS — R232 Flushing: Secondary | ICD-10-CM | POA: Diagnosis not present

## 2020-03-03 DIAGNOSIS — G479 Sleep disorder, unspecified: Secondary | ICD-10-CM | POA: Diagnosis not present

## 2020-04-01 DIAGNOSIS — Z85820 Personal history of malignant melanoma of skin: Secondary | ICD-10-CM | POA: Diagnosis not present

## 2020-04-01 DIAGNOSIS — D225 Melanocytic nevi of trunk: Secondary | ICD-10-CM | POA: Diagnosis not present

## 2020-04-01 DIAGNOSIS — L821 Other seborrheic keratosis: Secondary | ICD-10-CM | POA: Diagnosis not present

## 2020-04-01 DIAGNOSIS — Z85828 Personal history of other malignant neoplasm of skin: Secondary | ICD-10-CM | POA: Diagnosis not present

## 2020-04-01 DIAGNOSIS — L570 Actinic keratosis: Secondary | ICD-10-CM | POA: Diagnosis not present

## 2020-04-07 DIAGNOSIS — H35033 Hypertensive retinopathy, bilateral: Secondary | ICD-10-CM | POA: Diagnosis not present

## 2020-05-13 DIAGNOSIS — L430 Hypertrophic lichen planus: Secondary | ICD-10-CM | POA: Diagnosis not present

## 2020-05-13 DIAGNOSIS — D485 Neoplasm of uncertain behavior of skin: Secondary | ICD-10-CM | POA: Diagnosis not present

## 2020-05-13 DIAGNOSIS — L821 Other seborrheic keratosis: Secondary | ICD-10-CM | POA: Diagnosis not present

## 2020-05-19 IMAGING — MG DIGITAL SCREENING BILAT W/ TOMO W/ CAD
3 series · 3 of 11 positions shown · non-contrast
Comparison: Previous exam(s).

CLINICAL DATA: Screening.

EXAM:
DIGITAL SCREENING BILATERAL MAMMOGRAM WITH TOMO AND CAD

[R CC synth-2D]
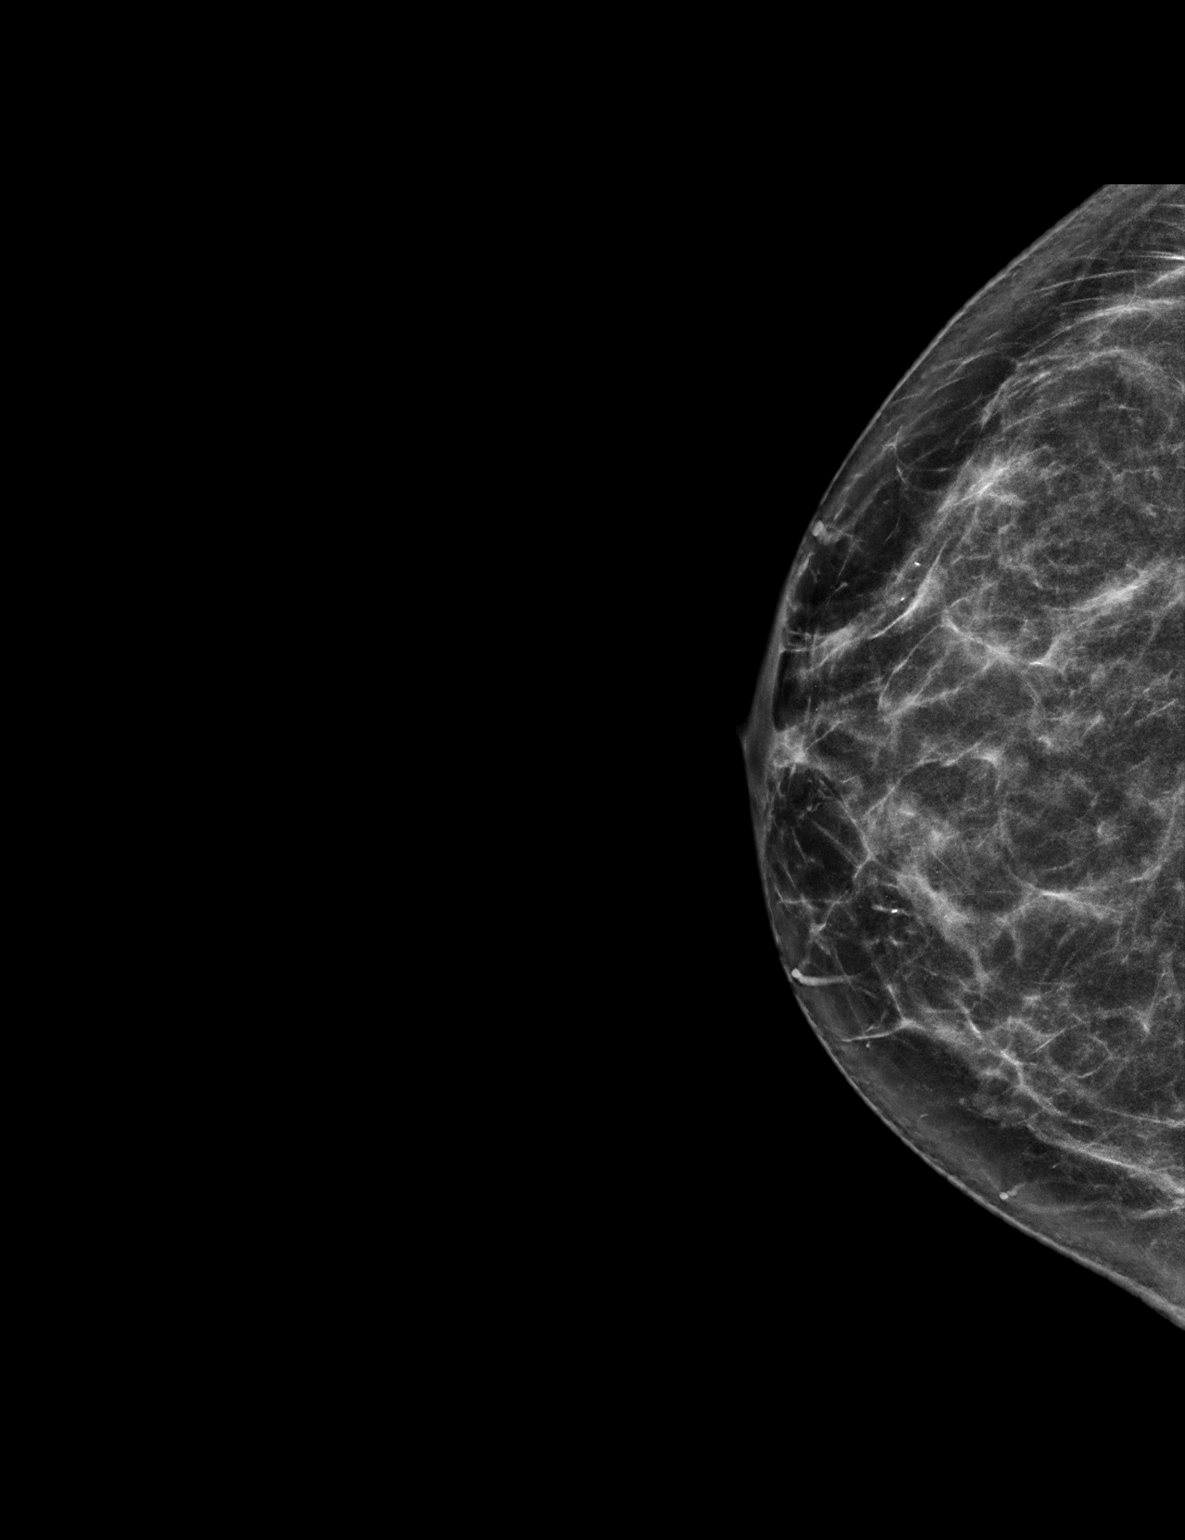

[L CC tomo · tomo slice 30/59.0]
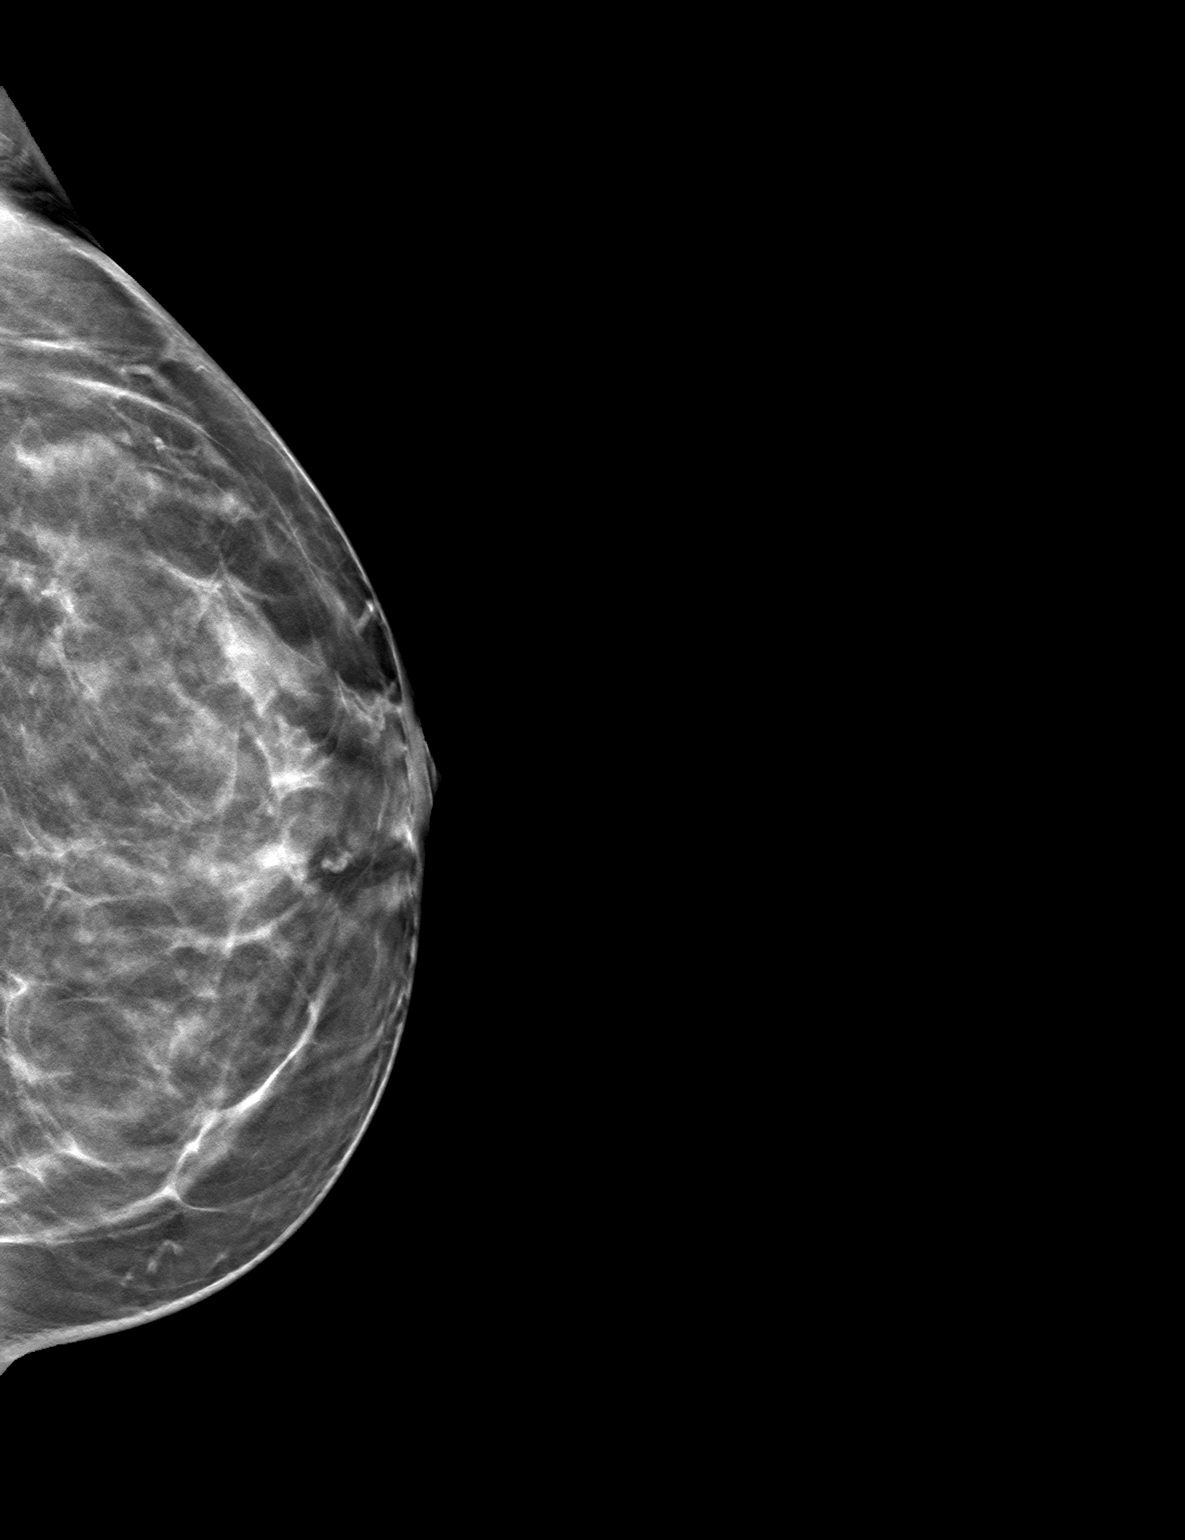

[R CC tomo · tomo slice 32/63.0]
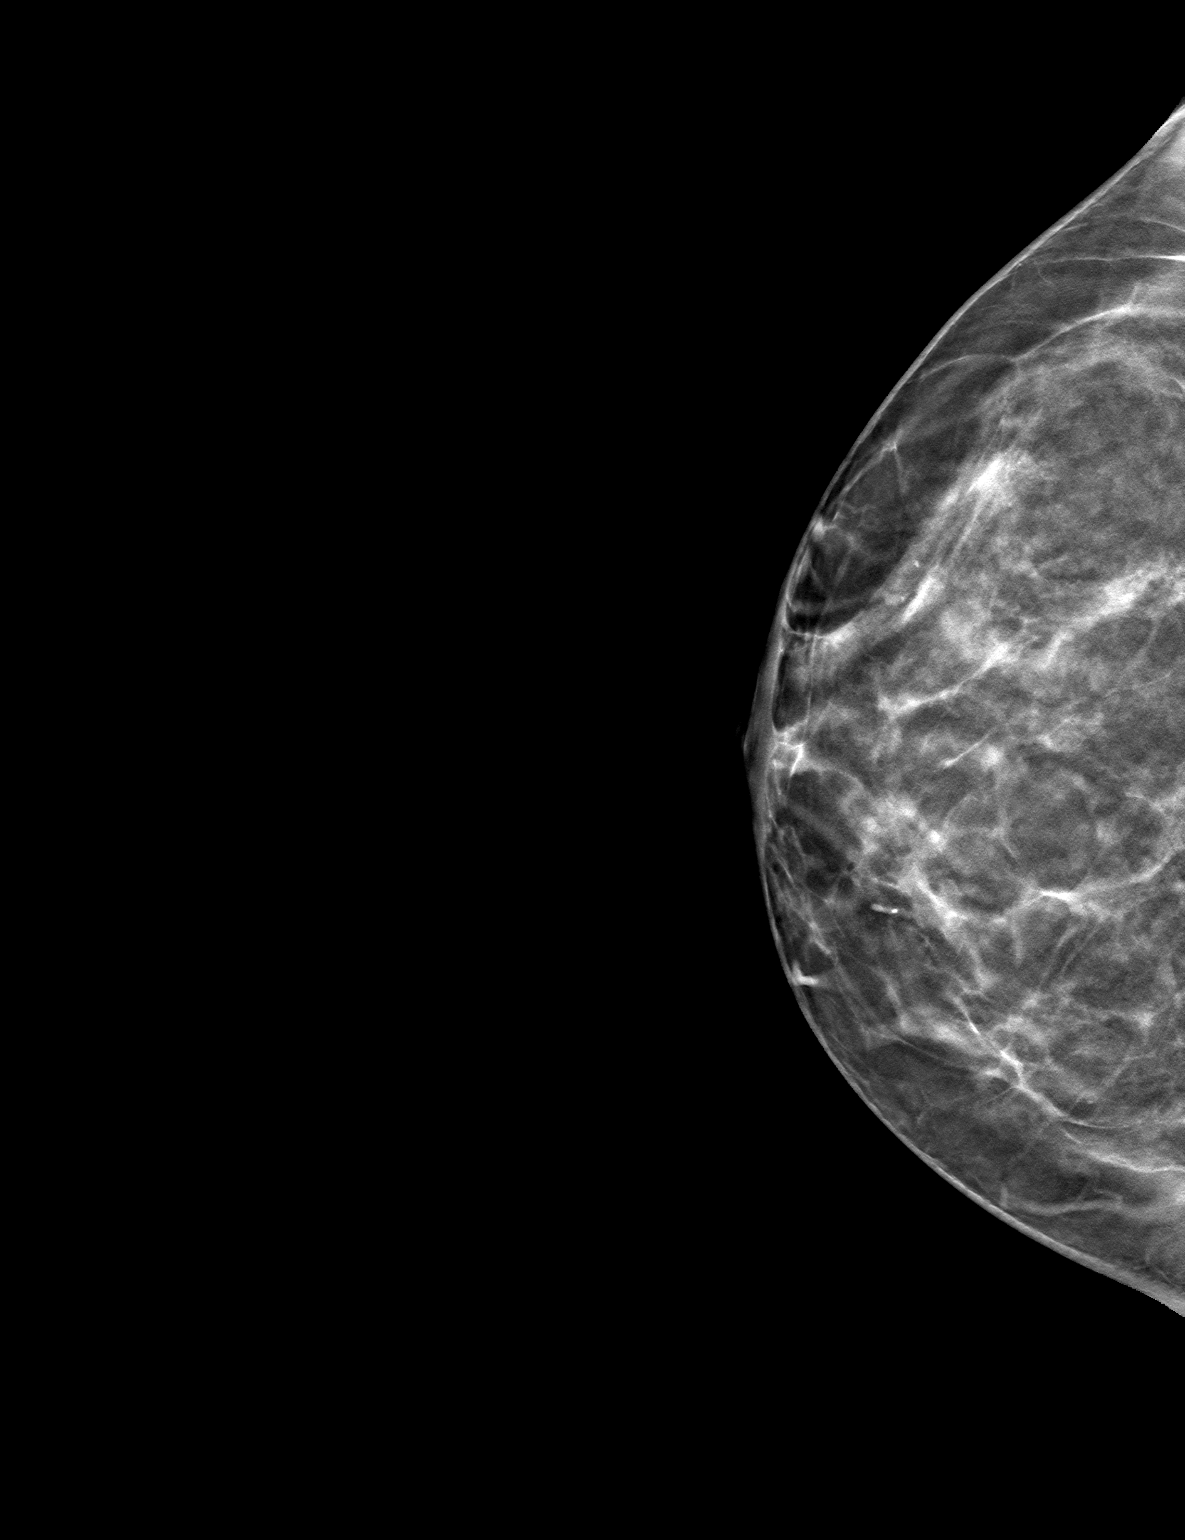

[3 of 11 positions shown; findings below may reference images not displayed]

ACR Breast Density Category c: The breast tissue is heterogeneously
dense, which may obscure small masses.
FINDINGS: There are no findings suspicious for malignancy. Images were
processed with CAD.
IMPRESSION: No mammographic evidence of malignancy. A result letter of this
screening mammogram will be mailed directly to the patient.

RECOMMENDATION:
Screening mammogram in one year. (Code:[id number])

BI-RADS CATEGORY  1: Negative.

## 2020-06-09 DIAGNOSIS — G479 Sleep disorder, unspecified: Secondary | ICD-10-CM | POA: Diagnosis not present

## 2020-06-09 DIAGNOSIS — I10 Essential (primary) hypertension: Secondary | ICD-10-CM | POA: Diagnosis not present

## 2020-06-09 DIAGNOSIS — N951 Menopausal and female climacteric states: Secondary | ICD-10-CM | POA: Diagnosis not present

## 2020-06-09 DIAGNOSIS — E782 Mixed hyperlipidemia: Secondary | ICD-10-CM | POA: Diagnosis not present

## 2020-06-16 DIAGNOSIS — R6882 Decreased libido: Secondary | ICD-10-CM | POA: Diagnosis not present

## 2020-06-16 DIAGNOSIS — N951 Menopausal and female climacteric states: Secondary | ICD-10-CM | POA: Diagnosis not present

## 2020-06-16 DIAGNOSIS — G479 Sleep disorder, unspecified: Secondary | ICD-10-CM | POA: Diagnosis not present

## 2020-06-30 ENCOUNTER — Telehealth: Payer: Self-pay | Admitting: Family Medicine

## 2020-06-30 DIAGNOSIS — I10 Essential (primary) hypertension: Secondary | ICD-10-CM

## 2020-06-30 MED ORDER — AMLODIPINE BESY-BENAZEPRIL HCL 5-10 MG PO CAPS: ORAL_CAPSULE | ORAL | 0 refills | Status: DC

## 2020-06-30 NOTE — Telephone Encounter (Signed)
Rx done. 

## 2020-06-30 NOTE — Telephone Encounter (Signed)
amLODipine-benazepril (LOTREL) 5-10 MG capsule   [place] [address] [facility], [place] Phone:  [phone]  Fax:  [phone]       She has her physical scheduled for 08/08/2020

## 2020-06-30 NOTE — Addendum Note (Signed)
Addended by: Agnes Lawrence on: 06/30/2020 11:44 AM   Modules accepted: Orders

## 2020-07-03 DIAGNOSIS — N393 Stress incontinence (female) (male): Secondary | ICD-10-CM | POA: Diagnosis not present

## 2020-07-03 DIAGNOSIS — Z79890 Hormone replacement therapy (postmenopausal): Secondary | ICD-10-CM | POA: Diagnosis not present

## 2020-07-03 DIAGNOSIS — Z130 Encounter for screening for diseases of the blood and blood-forming organs and certain disorders involving the immune mechanism: Secondary | ICD-10-CM | POA: Diagnosis not present

## 2020-07-03 DIAGNOSIS — Z01419 Encounter for gynecological examination (general) (routine) without abnormal findings: Secondary | ICD-10-CM | POA: Diagnosis not present

## 2020-07-03 DIAGNOSIS — Z780 Asymptomatic menopausal state: Secondary | ICD-10-CM | POA: Diagnosis not present

## 2020-08-08 ENCOUNTER — Ambulatory Visit: Payer: BC Managed Care – PPO | Admitting: Family Medicine

## 2020-09-15 DIAGNOSIS — R6882 Decreased libido: Secondary | ICD-10-CM | POA: Diagnosis not present

## 2020-09-15 DIAGNOSIS — G479 Sleep disorder, unspecified: Secondary | ICD-10-CM | POA: Diagnosis not present

## 2020-09-15 DIAGNOSIS — N951 Menopausal and female climacteric states: Secondary | ICD-10-CM | POA: Diagnosis not present

## 2020-09-22 DIAGNOSIS — I890 Lymphedema, not elsewhere classified: Secondary | ICD-10-CM | POA: Diagnosis not present

## 2020-09-22 DIAGNOSIS — R232 Flushing: Secondary | ICD-10-CM | POA: Diagnosis not present

## 2020-09-22 DIAGNOSIS — N958 Other specified menopausal and perimenopausal disorders: Secondary | ICD-10-CM | POA: Diagnosis not present

## 2020-09-22 DIAGNOSIS — Z6827 Body mass index (BMI) 27.0-27.9, adult: Secondary | ICD-10-CM | POA: Diagnosis not present

## 2020-10-27 ENCOUNTER — Telehealth: Payer: Self-pay | Admitting: Family Medicine

## 2020-10-27 NOTE — Telephone Encounter (Signed)
Pt is calling in wanting to have her CPE labs done before her appointment on Friday and would like to see if she can get it done on Thursday she is aware that we do them the same day for insurance purpose.  Pt stated that she is not able to not eat up to 1:00 p.m.

## 2020-10-29 ENCOUNTER — Other Ambulatory Visit: Payer: Self-pay | Admitting: Family Medicine

## 2020-10-29 DIAGNOSIS — I10 Essential (primary) hypertension: Secondary | ICD-10-CM

## 2020-10-29 DIAGNOSIS — E785 Hyperlipidemia, unspecified: Secondary | ICD-10-CM

## 2020-10-29 NOTE — Telephone Encounter (Signed)
I'm fine with her doing ahead of time. I have put in orders for her.

## 2020-10-29 NOTE — Telephone Encounter (Signed)
Pt was called and scheduled !

## 2020-10-29 NOTE — Telephone Encounter (Signed)
Noted  

## 2020-10-31 ENCOUNTER — Ambulatory Visit (INDEPENDENT_AMBULATORY_CARE_PROVIDER_SITE_OTHER): Payer: BC Managed Care – PPO | Admitting: Family Medicine

## 2020-10-31 ENCOUNTER — Other Ambulatory Visit: Payer: Self-pay

## 2020-10-31 ENCOUNTER — Encounter: Payer: Self-pay | Admitting: Family Medicine

## 2020-10-31 VITALS — BP 108/80 | HR 61 | Temp 97.4°F | Ht 63.75 in | Wt 157.4 lb

## 2020-10-31 DIAGNOSIS — Z0000 Encounter for general adult medical examination without abnormal findings: Secondary | ICD-10-CM | POA: Diagnosis not present

## 2020-10-31 DIAGNOSIS — E785 Hyperlipidemia, unspecified: Secondary | ICD-10-CM | POA: Diagnosis not present

## 2020-10-31 DIAGNOSIS — C4372 Malignant melanoma of left lower limb, including hip: Secondary | ICD-10-CM | POA: Diagnosis not present

## 2020-10-31 DIAGNOSIS — I10 Essential (primary) hypertension: Secondary | ICD-10-CM

## 2020-10-31 MED ORDER — AMLODIPINE BESYLATE 5 MG PO TABS: 5.0000 mg | ORAL_TABLET | Freq: Every day | ORAL | 1 refills | Status: DC

## 2020-10-31 NOTE — Patient Instructions (Addendum)
We are going to change blood pressure medication to just the amlodipine 5mg  daily. Please check pressures at home daily or every other day and report to me in mychart in 2 weeks time.    Can go to [email or web address] to get more information on COVID vaccination. They try to keep up with up to date information.

## 2020-10-31 NOTE — Progress Notes (Signed)
Dawn Cole DOB: 22-Oct-1966 Encounter date: 10/31/2020  This is a 54 y.o. female who presents for complete physical   History of present illness/Additional concerns:  Hypertension: Amlodipine-benazepril 5-10 mg daily.  At last visit she was regularly doing beach body and working on healthy eating as well as weight loss. Doing new work outs now with [employer]. BP usually running around 115-120/75-80's at last visit. Would like to come off of blood pressure medications if possible. Is working on healthy eating. Still doing beach body regularly. Plans to add back in evening walking.   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 had visit in march. Follows with [facility].   Follows with gynecology regularly [facility]). Mammogram completed 02/20/20 - normal  Last colonoscopy was 03/07/2017; repeat 5 years due to fam history.  Past Medical History:  Diagnosis Date  . 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 Family history of brain cancer   . Family history of colon cancer   . Family history of melanoma   . Family history of ovarian cancer   . Family history of pancreatic cancer   . HYPERLIPIDEMIA 10/09/2007  . HYPERTENSION 01/09/2009  . Malignant melanoma ([place])    Past Surgical History:  Procedure Laterality Date  . ABDOMINAL HYSTERECTOMY     heavy bleeding/irregular periods; fibroids. no cancer concerns.  . COLONOSCOPY  02/2017  . HYSTEROSCOPY     x2  . LIPOMA EXCISION Left 08/02/2017   Procedure: WIDE LOCAL EXCISION WITH ADVANCEMENT FLAP CLOSURE LEFT LOWER LEG AND SENTINEL LYMPH NODE MAPPING AND BIOPOSY;  Surgeon: [name], MD;  Location: [facility];  Service: General;  Laterality: Left;  GENERAL AND LOCAL  . MELANOMA EXCISION  2018   Allergies  Allergen Reactions  . Adhesive [Tape] Other (See Comments)    BANDAIDS- RIPS SKIN OFF    Current Meds  Medication Sig  . calcium carbonate (OS-CAL) 600 MG TABS tablet Take 600 mg by mouth daily.  Marland Kitchen  estradiol (VIVELLE-DOT) 0.075 MG/24HR Place 0.5 patches onto the skin 2 (two) times a week.   . tolterodine (DETROL LA) 2 MG 24 hr capsule Take 2 mg by mouth daily.   . [DISCONTINUED] amLODipine-benazepril (LOTREL) 5-10 MG capsule TAKE 1 BY MOUTH DAILY   Social History   Tobacco Use  . Smoking status: Never Smoker  . Smokeless tobacco: Never Used  Substance Use Topics  . Alcohol use: No   Family History  Problem Relation Age of Onset  . Colon cancer Mother        dx in her late 75s, mets to pancreas, liver and lungs  . High blood pressure Mother   . High Cholesterol Mother   . Kidney Stones Mother   . Brain cancer Father 56  . COPD Sister   . Dementia Sister 64  . Lung cancer Brother 29       heavy smoker  . Lung cancer Maternal Uncle   . Colon cancer Maternal Grandfather 41  . Kidney Stones Sister   . COPD Sister   . Drug abuse Sister   . Pancreatic cancer Brother 38       maternal half brother - was exposed to agent orange  . Ovarian cancer Maternal Aunt        dx in her late 6s  . Lung cancer Maternal Aunt   . Melanoma Maternal Aunt   . Multiple myeloma Cousin  maternal first cousin  . Kidney cancer Cousin   . Heart failure Paternal Grandmother 83     Review of Systems  Constitutional: Negative for activity change, appetite change, chills, fatigue, fever and unexpected weight change.  HENT: Negative for congestion, ear pain, hearing loss, sinus pressure, sinus pain, sore throat and trouble swallowing.   Eyes: Negative for pain and visual disturbance.  Respiratory: Negative for cough, chest tightness, shortness of breath and wheezing.   Cardiovascular: Negative for chest pain, palpitations and leg swelling.  Gastrointestinal: Negative for abdominal pain, blood in stool, constipation, diarrhea, nausea and vomiting.  Genitourinary: Negative for difficulty urinating and menstrual problem.  Musculoskeletal: Negative for arthralgias and back pain.  Skin:  Negative for rash.  Neurological: Negative for dizziness, weakness, numbness and headaches.  Hematological: Negative for adenopathy. Does not bruise/bleed easily.  Psychiatric/Behavioral: Negative for sleep disturbance and suicidal ideas. The patient is not nervous/anxious.     CBC:  Lab Results  Component Value Date   WBC 8.1 01/10/2020   HGB 14.1 01/10/2020   HCT 41.9 01/10/2020   MCH 29.1 07/22/2017   MCHC 33.8 01/10/2020   RDW 12.8 01/10/2020   PLT 239.0 01/10/2020   CMP: Lab Results  Component Value Date   NA 137 01/10/2020   K 4.4 01/10/2020   CL 105 01/10/2020   CO2 28 01/10/2020   ANIONGAP 6 07/22/2017   GLUCOSE 84 01/10/2020   BUN 21 01/10/2020   CREATININE 0.76 01/10/2020   GFRAA >60 07/22/2017   CALCIUM 9.1 01/10/2020   PROT 6.6 01/10/2020   BILITOT 0.4 01/10/2020   ALKPHOS 64 01/10/2020   ALT 21 01/10/2020   AST 19 01/10/2020   LIPID: Lab Results  Component Value Date   CHOL 186 01/10/2020   TRIG 86.0 01/10/2020   HDL 51.80 01/10/2020   LDLCALC 117 (H) 01/10/2020    Objective:  BP 108/80 (BP Location: Left Arm, Patient Position: Sitting, Cuff Size: Normal)   Pulse 61   Temp (!) 97.4 F (36.3 C) (Oral)   Ht 5' 3.75" (1.619 m)   Wt 157 lb 6.4 oz (71.4 kg)   BMI 27.23 kg/m   Weight: 157 lb 6.4 oz (71.4 kg)   BP Readings from Last 3 Encounters:  10/31/20 108/80  01/02/20 121/80  02/07/19 120/80   Wt Readings from Last 3 Encounters:  10/31/20 157 lb 6.4 oz (71.4 kg)  01/02/20 148 lb 8 oz (67.4 kg)  02/07/19 156 lb 12.8 oz (71.1 kg)    Physical Exam Constitutional:      General: She is not in acute distress.    Appearance: She is well-developed.  HENT:     Head: Normocephalic and atraumatic.     Right Ear: External ear normal.     Left Ear: External ear normal.     Mouth/Throat:     Pharynx: No oropharyngeal exudate.  Eyes:     Conjunctiva/sclera: Conjunctivae normal.     Pupils: Pupils are equal, round, and reactive to light.   Neck:     Thyroid: No thyromegaly.  Cardiovascular:     Rate and Rhythm: Normal rate and regular rhythm.     Heart sounds: Normal heart sounds. No murmur heard.  No friction rub. No gallop.   Pulmonary:     Effort: Pulmonary effort is normal.     Breath sounds: Normal breath sounds.  Abdominal:     General: Bowel sounds are normal. There is no distension.     Palpations: Abdomen is  soft. There is no mass.     Tenderness: There is no abdominal tenderness. There is no guarding.     Hernia: No hernia is present.  Musculoskeletal:        General: No tenderness or deformity. Normal range of motion.     Cervical back: Normal range of motion and neck supple.  Lymphadenopathy:     Cervical: No cervical adenopathy.  Skin:    General: Skin is warm and dry.     Findings: No rash.  Neurological:     Mental Status: She is alert and oriented to person, place, and time.     Deep Tendon Reflexes: Reflexes normal.     Reflex Scores:      Tricep reflexes are 2+ on the right side and 2+ on the left side.      Bicep reflexes are 2+ on the right side and 2+ on the left side.      Brachioradialis reflexes are 2+ on the right side and 2+ on the left side.      Patellar reflexes are 2+ on the right side and 2+ on the left side. Psychiatric:        Speech: Speech normal.        Behavior: Behavior normal.        Thought Content: Thought content normal.     Assessment/Plan: There are no preventive care reminders to display for this patient. Health Maintenance reviewed.  1. Preventative health care We did discuss COVID vaccination today.   2. Essential hypertension Blood pressure has been well controlled and she would like to wean off medication if possible. We are going to decrease to just the amlodipine 38m.   3. Dyslipidemia Has been diet controlled.   4. 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    We did discuss covid  vaccination; she is not comfortable getting it at this time. Encouraged her to look at [facility] web site or ask if specific concerns. She was exposed last year and did not get sick.  Return for pending blood pressure report.  JMicheline Rough MD

## 2020-11-04 ENCOUNTER — Other Ambulatory Visit: Payer: BC Managed Care – PPO

## 2020-11-16 ENCOUNTER — Encounter: Payer: Self-pay | Admitting: Family Medicine

## 2020-11-17 ENCOUNTER — Other Ambulatory Visit: Payer: Self-pay

## 2020-11-17 ENCOUNTER — Other Ambulatory Visit (INDEPENDENT_AMBULATORY_CARE_PROVIDER_SITE_OTHER): Payer: BC Managed Care – PPO

## 2020-11-17 DIAGNOSIS — I10 Essential (primary) hypertension: Secondary | ICD-10-CM | POA: Diagnosis not present

## 2020-11-17 DIAGNOSIS — E785 Hyperlipidemia, unspecified: Secondary | ICD-10-CM

## 2020-11-17 LAB — CBC WITH DIFFERENTIAL/PLATELET
Basophils Absolute: 0 10*3/uL (ref 0.0–0.1)
Basophils Relative: 0.5 % (ref 0.0–3.0)
Eosinophils Absolute: 0.3 10*3/uL (ref 0.0–0.7)
Eosinophils Relative: 3.9 % (ref 0.0–5.0)
HCT: 43.4 % (ref 36.0–46.0)
Hemoglobin: 14.7 g/dL (ref 12.0–15.0)
Lymphocytes Relative: 25.6 % (ref 12.0–46.0)
Lymphs Abs: 2.2 10*3/uL (ref 0.7–4.0)
MCHC: 33.8 g/dL (ref 30.0–36.0)
MCV: 87.8 fl (ref 78.0–100.0)
Monocytes Absolute: 0.7 10*3/uL (ref 0.1–1.0)
Monocytes Relative: 7.5 % (ref 3.0–12.0)
Neutro Abs: 5.5 10*3/uL (ref 1.4–7.7)
Neutrophils Relative %: 62.5 % (ref 43.0–77.0)
Platelets: 261 10*3/uL (ref 150.0–400.0)
RBC: 4.94 Mil/uL (ref 3.87–5.11)
RDW: 12.4 % (ref 11.5–15.5)
WBC: 8.7 10*3/uL (ref 4.0–10.5)

## 2020-11-17 LAB — COMPREHENSIVE METABOLIC PANEL
ALT: 22 U/L (ref 0–35)
AST: 24 U/L (ref 0–37)
Albumin: 4.4 g/dL (ref 3.5–5.2)
Alkaline Phosphatase: 62 U/L (ref 39–117)
BUN: 15 mg/dL (ref 6–23)
CO2: 30 mEq/L (ref 19–32)
Calcium: 9.8 mg/dL (ref 8.4–10.5)
Chloride: 101 mEq/L (ref 96–112)
Creatinine, Ser: 0.73 mg/dL (ref 0.40–1.20)
GFR: 92.06 mL/min (ref >60.00)
Glucose, Bld: 84 mg/dL (ref 70–99)
Potassium: 4.9 mEq/L (ref 3.5–5.1)
Sodium: 137 mEq/L (ref 135–145)
Total Bilirubin: 0.6 mg/dL (ref 0.2–1.2)
Total Protein: 7.3 g/dL (ref 6.0–8.3)

## 2020-11-17 LAB — LIPID PANEL
Cholesterol: 204 mg/dL — ABNORMAL HIGH (ref 0–200)
HDL: 49.4 mg/dL (ref >39.00)
LDL Cholesterol: 132 mg/dL — ABNORMAL HIGH (ref 0–99)
NonHDL: 154.63
Total CHOL/HDL Ratio: 4
Triglycerides: 115 mg/dL (ref 0.0–149.0)
VLDL: 23 mg/dL (ref 0.0–40.0)

## 2020-11-17 NOTE — Addendum Note (Signed)
Addended by: Marrion Coy on: 11/17/2020 07:05 AM   Modules accepted: Orders

## 2020-12-03 ENCOUNTER — Encounter: Payer: Self-pay | Admitting: Family Medicine

## 2020-12-04 ENCOUNTER — Telehealth (INDEPENDENT_AMBULATORY_CARE_PROVIDER_SITE_OTHER): Payer: BC Managed Care – PPO | Admitting: Family Medicine

## 2020-12-04 ENCOUNTER — Encounter: Payer: Self-pay | Admitting: Family Medicine

## 2020-12-04 VITALS — BP 120/68

## 2020-12-04 DIAGNOSIS — R0981 Nasal congestion: Secondary | ICD-10-CM | POA: Diagnosis not present

## 2020-12-04 MED ORDER — AMOXICILLIN-POT CLAVULANATE 875-125 MG PO TABS: 1.0000 | ORAL_TABLET | Freq: Two times a day (BID) | ORAL | 0 refills | Status: DC

## 2020-12-04 NOTE — Progress Notes (Signed)
Virtual Visit via Video Note  I connected with Dawn Cole  on 12/04/20 at  1:20 PM EST by a video enabled telemedicine application and verified that I am speaking with the correct person using two identifiers.  Location patient: home, [place] Location provider:work or home office Persons participating in the virtual visit: patient, provider  I discussed the limitations of evaluation and management by telemedicine and the availability of in person appointments. The patient expressed understanding and agreed to proceed.   HPI:  Acute telemedicine visit for : -Onset: chronic nasal congestion/stuffiness, but worse the the last 2 weeks -Symptoms include: nasal congestion, PND, one side of nose is worse and gets very stopped up at night, nasal congestin is green when it comes out -Denies:fevers, pain, cough, SOB -Has tried: musinex D, tried nasocort - but stopped it because had a little mucus in the nose, saline spray at night, humidifier last night  ROS: See pertinent positives and negatives per HPI.  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 no cancer concerns.   COLONOSCOPY  02/2017   HYSTEROSCOPY     x2   LIPOMA EXCISION Left 08/02/2017   Procedure: WIDE LOCAL EXCISION WITH ADVANCEMENT FLAP CLOSURE LEFT LOWER LEG AND SENTINEL LYMPH NODE MAPPING AND BIOPOSY;  Surgeon: [name], MD;  Location: [facility];  Service: General;  Laterality: Left;  GENERAL AND LOCAL   MELANOMA EXCISION  2018     Current Outpatient Medications:    amLODipine (NORVASC) 5 MG  tablet, Take 1 tablet (5 mg total) by mouth daily., Disp: 90 tablet, Rfl: 1   estradiol (VIVELLE-DOT) 0.075 MG/24HR, Place 0.5 patches onto the skin 2 (two) times a week., Disp: , Rfl:    OVER THE COUNTER MEDICATION, Multivitamin with calcium, Disp: , Rfl:    tolterodine (DETROL LA) 2 MG 24 hr capsule, Take 2 mg by mouth daily. , Disp: , Rfl:    amoxicillin-clavulanate (AUGMENTIN) 875-125 MG tablet, Take 1 tablet by mouth 2 (two) times daily., Disp: 20 tablet, Rfl: 0  EXAM:  VITALS per patient if applicable:  GENERAL: alert, oriented, appears well and in no acute distress  HEENT: atraumatic, conjunttiva clear, no obvious abnormalities on inspection of external nose and ears  NECK: normal movements of the head and neck  LUNGS: on inspection no signs of respiratory distress, breathing rate appears normal, no obvious gross SOB, gasping or wheezing  CV: no obvious cyanosis  MS: moves all visible extremities without noticeable abnormality  PSYCH/NEURO: pleasant and cooperative, no obvious depression or anxiety, speech and thought processing grossly intact  ASSESSMENT AND PLAN:  Discussed the following assessment and plan:  Nasal sinus congestion  -we discussed possible serious and likely etiologies, options for evaluation and workup, limitations of telemedicine visit vs in person visit, treatment, treatment risks and precautions. Pt prefers to treat via telemedicine empirically rather than in person at this moment. Opted to treat acute issues with antibiotic for possible bacterial sinusitis given 2 weeks of worsening sinus congestion with  green mucus. Sent Augmentin 875 twice daily for 10 days. For chronic issues, advised in person evaluation, preferably with ear, nose and throat. She agrees to set up an appointment, her family has seen Dr. Lucia Gaskins in the past. Discussed other options as well if she cannot get in there and she agrees to call to check on availability. Also, since she has  not tolerated a nasal steroid, opted to start a trial of Zyrtec once daily in case allergies are contributing. Continue nasal saline and humidifier. Advised if any issues scheduling an ear nose and throat evaluation, to contact her primary care office for assistance.  Scheduled follow up with PCP offered: Agrees to follow-up as needed  Advised to seek prompt in person care if worsening, new symptoms arise, or if is not improving with treatment. Discussed options for inperson care if PCP office not available. Did let this patient know that I only do telemedicine on Tuesdays and Thursdays for [facility]. Advised to schedule follow up visit with PCP or [facility] if any further questions or concerns to avoid delays in care.   I discussed the assessment and treatment plan with the patient. The patient was provided an opportunity to ask questions and all were answered. The patient agreed with the plan and demonstrated an understanding of the instructions.     Lucretia Kern, DO

## 2020-12-04 NOTE — Patient Instructions (Signed)
-  I sent the medication(s) we discussed to your pharmacy: Meds ordered this encounter  Medications  . amoxicillin-clavulanate (AUGMENTIN) 875-125 MG tablet    Sig: Take 1 tablet by mouth 2 (two) times daily.    Dispense:  20 tablet    Refill:  0   Nasal saline. Humidifier. Stay hydrated.  Zyrtec once daily.  Call to set up an Ear, nose and throat appointment.  I hope you are feeling better soon!  Seek in person care promptly if your symptoms worsen, new concerns arise or you are not improving with treatment.  It was nice to meet you today. I help [facility] out with telemedicine visits on Tuesdays and Thursdays and am available for visits on those days. If you have any concerns or questions following this visit please schedule a follow up visit with your Primary Care doctor or seek care at a local urgent care clinic to avoid delays in care.

## 2020-12-04 NOTE — Telephone Encounter (Signed)
Spoke with the pt and scheduled a virtual visit for today with Dr Maudie Mercury as PCP is out of the office.

## 2020-12-29 DIAGNOSIS — N958 Other specified menopausal and perimenopausal disorders: Secondary | ICD-10-CM | POA: Diagnosis not present

## 2021-01-19 DIAGNOSIS — Z6828 Body mass index (BMI) 28.0-28.9, adult: Secondary | ICD-10-CM | POA: Diagnosis not present

## 2021-01-19 DIAGNOSIS — R6882 Decreased libido: Secondary | ICD-10-CM | POA: Diagnosis not present

## 2021-01-19 DIAGNOSIS — R232 Flushing: Secondary | ICD-10-CM | POA: Diagnosis not present

## 2021-01-19 DIAGNOSIS — N958 Other specified menopausal and perimenopausal disorders: Secondary | ICD-10-CM | POA: Diagnosis not present

## 2021-02-03 DIAGNOSIS — L438 Other lichen planus: Secondary | ICD-10-CM | POA: Diagnosis not present

## 2021-02-03 DIAGNOSIS — D485 Neoplasm of uncertain behavior of skin: Secondary | ICD-10-CM | POA: Diagnosis not present

## 2021-02-03 DIAGNOSIS — B078 Other viral warts: Secondary | ICD-10-CM | POA: Diagnosis not present

## 2021-02-03 DIAGNOSIS — L570 Actinic keratosis: Secondary | ICD-10-CM | POA: Diagnosis not present

## 2021-03-06 ENCOUNTER — Other Ambulatory Visit: Payer: Self-pay | Admitting: Family Medicine

## 2021-03-06 DIAGNOSIS — Z1231 Encounter for screening mammogram for malignant neoplasm of breast: Secondary | ICD-10-CM

## 2021-04-07 ENCOUNTER — Other Ambulatory Visit: Payer: Self-pay

## 2021-04-07 ENCOUNTER — Ambulatory Visit
Admission: RE | Admit: 2021-04-07 | Discharge: 2021-04-07 | Disposition: A | Discharge status: Home / Self Care | Payer: BC Managed Care – PPO | Source: Ambulatory Visit | Attending: Family Medicine | Admitting: Family Medicine

## 2021-04-07 DIAGNOSIS — Z1231 Encounter for screening mammogram for malignant neoplasm of breast: Secondary | ICD-10-CM | POA: Diagnosis not present

## 2021-04-13 DIAGNOSIS — C44519 Basal cell carcinoma of skin of other part of trunk: Secondary | ICD-10-CM | POA: Diagnosis not present

## 2021-04-13 DIAGNOSIS — L821 Other seborrheic keratosis: Secondary | ICD-10-CM | POA: Diagnosis not present

## 2021-04-13 DIAGNOSIS — Z85820 Personal history of malignant melanoma of skin: Secondary | ICD-10-CM | POA: Diagnosis not present

## 2021-04-13 DIAGNOSIS — L570 Actinic keratosis: Secondary | ICD-10-CM | POA: Diagnosis not present

## 2021-04-13 DIAGNOSIS — Z85828 Personal history of other malignant neoplasm of skin: Secondary | ICD-10-CM | POA: Diagnosis not present

## 2021-04-13 DIAGNOSIS — L814 Other melanin hyperpigmentation: Secondary | ICD-10-CM | POA: Diagnosis not present

## 2021-04-16 DIAGNOSIS — I10 Essential (primary) hypertension: Secondary | ICD-10-CM | POA: Diagnosis not present

## 2021-04-20 DIAGNOSIS — R6882 Decreased libido: Secondary | ICD-10-CM | POA: Diagnosis not present

## 2021-04-20 DIAGNOSIS — R232 Flushing: Secondary | ICD-10-CM | POA: Diagnosis not present

## 2021-04-20 DIAGNOSIS — Z6829 Body mass index (BMI) 29.0-29.9, adult: Secondary | ICD-10-CM | POA: Diagnosis not present

## 2021-04-20 DIAGNOSIS — N958 Other specified menopausal and perimenopausal disorders: Secondary | ICD-10-CM | POA: Diagnosis not present

## 2021-04-20 DIAGNOSIS — H35033 Hypertensive retinopathy, bilateral: Secondary | ICD-10-CM | POA: Diagnosis not present

## 2021-04-27 ENCOUNTER — Other Ambulatory Visit: Payer: Self-pay | Admitting: Family Medicine

## 2021-06-16 DIAGNOSIS — Z6829 Body mass index (BMI) 29.0-29.9, adult: Secondary | ICD-10-CM | POA: Diagnosis not present

## 2021-06-16 DIAGNOSIS — N958 Other specified menopausal and perimenopausal disorders: Secondary | ICD-10-CM | POA: Diagnosis not present

## 2021-06-16 DIAGNOSIS — E782 Mixed hyperlipidemia: Secondary | ICD-10-CM | POA: Diagnosis not present

## 2021-06-23 DIAGNOSIS — Z6828 Body mass index (BMI) 28.0-28.9, adult: Secondary | ICD-10-CM | POA: Diagnosis not present

## 2021-06-23 DIAGNOSIS — I10 Essential (primary) hypertension: Secondary | ICD-10-CM | POA: Diagnosis not present

## 2021-06-23 DIAGNOSIS — E782 Mixed hyperlipidemia: Secondary | ICD-10-CM | POA: Diagnosis not present

## 2021-06-30 DIAGNOSIS — I890 Lymphedema, not elsewhere classified: Secondary | ICD-10-CM | POA: Diagnosis not present

## 2021-06-30 DIAGNOSIS — E782 Mixed hyperlipidemia: Secondary | ICD-10-CM | POA: Diagnosis not present

## 2021-06-30 DIAGNOSIS — Z6828 Body mass index (BMI) 28.0-28.9, adult: Secondary | ICD-10-CM | POA: Diagnosis not present

## 2021-07-27 MED ORDER — AMLODIPINE BESYLATE 5 MG PO TABS: 5.0000 mg | ORAL_TABLET | Freq: Every day | ORAL | 0 refills | Status: DC

## 2021-07-27 NOTE — Addendum Note (Signed)
Addended by: Agnes Lawrence on: 07/27/2021 03:10 PM   Modules accepted: Orders

## 2021-08-10 DIAGNOSIS — N951 Menopausal and female climacteric states: Secondary | ICD-10-CM | POA: Diagnosis not present

## 2021-08-14 DIAGNOSIS — N958 Other specified menopausal and perimenopausal disorders: Secondary | ICD-10-CM | POA: Diagnosis not present

## 2021-08-18 DIAGNOSIS — Z130 Encounter for screening for diseases of the blood and blood-forming organs and certain disorders involving the immune mechanism: Secondary | ICD-10-CM | POA: Diagnosis not present

## 2021-08-18 DIAGNOSIS — Z79890 Hormone replacement therapy (postmenopausal): Secondary | ICD-10-CM | POA: Diagnosis not present

## 2021-08-18 DIAGNOSIS — Z6826 Body mass index (BMI) 26.0-26.9, adult: Secondary | ICD-10-CM | POA: Diagnosis not present

## 2021-08-18 DIAGNOSIS — N393 Stress incontinence (female) (male): Secondary | ICD-10-CM | POA: Diagnosis not present

## 2021-08-18 DIAGNOSIS — Z01411 Encounter for gynecological examination (general) (routine) with abnormal findings: Secondary | ICD-10-CM | POA: Diagnosis not present

## 2021-08-18 DIAGNOSIS — Z780 Asymptomatic menopausal state: Secondary | ICD-10-CM | POA: Diagnosis not present

## 2021-08-21 ENCOUNTER — Ambulatory Visit: Payer: BC Managed Care – PPO | Admitting: Family Medicine

## 2021-09-11 ENCOUNTER — Other Ambulatory Visit: Payer: Self-pay

## 2021-09-11 ENCOUNTER — Encounter: Payer: Self-pay | Admitting: Family Medicine

## 2021-09-11 ENCOUNTER — Ambulatory Visit: Payer: BC Managed Care – PPO | Admitting: Family Medicine

## 2021-09-11 VITALS — BP 104/72 | HR 56 | Temp 97.6°F | Ht 63.75 in | Wt 167.5 lb

## 2021-09-11 DIAGNOSIS — R0981 Nasal congestion: Secondary | ICD-10-CM | POA: Diagnosis not present

## 2021-09-11 DIAGNOSIS — I10 Essential (primary) hypertension: Secondary | ICD-10-CM

## 2021-09-11 DIAGNOSIS — E785 Hyperlipidemia, unspecified: Secondary | ICD-10-CM

## 2021-09-11 MED ORDER — AZELASTINE HCL 0.1 % NA SOLN: 1.0000 | Freq: Two times a day (BID) | NASAL | 5 refills | Status: DC

## 2021-09-11 NOTE — Progress Notes (Signed)
Dawn Cole DOB: 03/16/1966 Encounter date: 09/11/2021  This is a 55 y.o. female who presents with Chief Complaint  Patient presents with   Follow-up    History of present illness: Right side of nose harder to breathe through. Has tried breathe-right - but they do irritate her skin. Using saline nasal spray. Nose got too dry with nasonex. Has tried zyrtec at night which has helped some. Towards end of august was very congested and felt better after beach vacation. No nasal fractures in history she is aware of unless unknown as child.   Sees dermatology next month.   Just had visit with eve this month. Had mammogram in April - that was normal.   HTN: taking amlodipine 5mg  daily. Pressures have been well controlled.    Allergies  Allergen Reactions   Adhesive [Tape] Other (See Comments)    BANDAIDS- RIPS SKIN OFF    Current Meds  Medication Sig   amLODipine (NORVASC) 5 MG tablet Take 1 tablet (5 mg total) by mouth daily.   azelastine (ASTELIN) 0.1 % nasal spray Place 1 spray into both nostrils 2 (two) times daily. Use in each nostril as directed   estradiol (VIVELLE-DOT) 0.075 MG/24HR Place 0.5 patches onto the skin 2 (two) times a week.   OVER THE COUNTER MEDICATION Multivitamin with calcium   tolterodine (DETROL LA) 2 MG 24 hr capsule Take 2 mg by mouth daily.     Review of Systems  Constitutional:  Negative for chills, fatigue and fever.  HENT:  Positive for congestion.   Respiratory:  Negative for cough, chest tightness, shortness of breath and wheezing.   Cardiovascular:  Negative for chest pain, palpitations and leg swelling.   Objective:  BP 104/72 (BP Location: Left Arm, Patient Position: Sitting, Cuff Size: Normal)   Pulse (!) 56   Temp 97.6 F (36.4 C) (Oral)   Ht 5' 3.75" (1.619 m)   Wt 167 lb 8 oz (76 kg)   SpO2 98%   BMI 28.98 kg/m   Weight: 167 lb 8 oz (76 kg)   BP Readings from Last 3 Encounters:  09/11/21 104/72  12/04/20 120/68  10/31/20 108/80    Wt Readings from Last 3 Encounters:  09/11/21 167 lb 8 oz (76 kg)  10/31/20 157 lb 6.4 oz (71.4 kg)  01/02/20 148 lb 8 oz (67.4 kg)    Physical Exam Constitutional:      General: She is not in acute distress.    Appearance: She is well-developed.  Cardiovascular:     Rate and Rhythm: Normal rate and regular rhythm.     Heart sounds: Normal heart sounds. No murmur heard.   No friction rub.  Pulmonary:     Effort: Pulmonary effort is normal. No respiratory distress.     Breath sounds: Normal breath sounds. No wheezing or rales.  Musculoskeletal:     Right lower leg: No edema.     Left lower leg: No edema.  Neurological:     Mental Status: She is alert and oriented to person, place, and time.  Psychiatric:        Behavior: Behavior normal.    Assessment/Plan  1. Nasal congestion Trial of antihistamine nasal spray.  She has not tolerated steroid nasal sprays in the past, but if antihistamine nasal spray was not working, suggested [employer].  Can also work on hydrating nose with Vaseline or nasal saline between sprays. - azelastine (ASTELIN) 0.1 % nasal spray; Place 1 spray into both nostrils 2 (  two) times daily. Use in each nostril as directed  Dispense: 30 mL; Refill: 5 - Ambulatory referral to ENT  2. Essential hypertension Blood pressure well controlled.  Okay to decrease amlodipine in half as long as blood pressures regularly staying below 125/75  3. Dyslipidemia She did have blood work done through work.  She will send me results.  She continues to work on healthy eating and regular exercise.  Return in about 6 months (around 03/11/2022) for physical exam.    Micheline Rough, MD

## 2021-09-11 NOTE — Patient Instructions (Addendum)
Ok to decrease amlodipine to half tab as long as bp is staying below 125/75 regularly. Let me know if you feel we need to decrease further.   Send me a copy of your labwork please :)

## 2021-10-13 DIAGNOSIS — L570 Actinic keratosis: Secondary | ICD-10-CM | POA: Diagnosis not present

## 2021-10-13 DIAGNOSIS — Z85828 Personal history of other malignant neoplasm of skin: Secondary | ICD-10-CM | POA: Diagnosis not present

## 2021-10-13 DIAGNOSIS — Z85820 Personal history of malignant melanoma of skin: Secondary | ICD-10-CM | POA: Diagnosis not present

## 2021-10-13 DIAGNOSIS — D1801 Hemangioma of skin and subcutaneous tissue: Secondary | ICD-10-CM | POA: Diagnosis not present

## 2021-10-13 DIAGNOSIS — D225 Melanocytic nevi of trunk: Secondary | ICD-10-CM | POA: Diagnosis not present

## 2021-10-27 DIAGNOSIS — S63601A Unspecified sprain of right thumb, initial encounter: Secondary | ICD-10-CM | POA: Diagnosis not present

## 2021-10-27 DIAGNOSIS — S63619A Unspecified sprain of unspecified finger, initial encounter: Secondary | ICD-10-CM | POA: Diagnosis not present

## 2021-11-02 LAB — LIPID PANEL
Cholesterol: 226 — AB (ref 0–200)
HDL: 56 (ref 35–70)
LDL Cholesterol: 154
Triglycerides: 93 (ref 40–160)

## 2021-11-02 LAB — BASIC METABOLIC PANEL: Glucose: 87

## 2021-11-09 ENCOUNTER — Encounter: Payer: Self-pay | Admitting: Family Medicine

## 2021-11-23 DIAGNOSIS — S63619A Unspecified sprain of unspecified finger, initial encounter: Secondary | ICD-10-CM | POA: Diagnosis not present

## 2021-11-30 ENCOUNTER — Other Ambulatory Visit: Payer: Self-pay | Admitting: Family Medicine

## 2021-12-01 MED ORDER — AMLODIPINE BESYLATE 5 MG PO TABS: 5.0000 mg | ORAL_TABLET | Freq: Every day | ORAL | 1 refills | Status: DC

## 2021-12-24 DIAGNOSIS — J3489 Other specified disorders of nose and nasal sinuses: Secondary | ICD-10-CM | POA: Diagnosis not present

## 2021-12-24 DIAGNOSIS — J342 Deviated nasal septum: Secondary | ICD-10-CM | POA: Diagnosis not present

## 2022-01-01 DIAGNOSIS — J342 Deviated nasal septum: Secondary | ICD-10-CM | POA: Diagnosis not present

## 2022-01-01 DIAGNOSIS — J329 Chronic sinusitis, unspecified: Secondary | ICD-10-CM | POA: Diagnosis not present

## 2022-01-01 DIAGNOSIS — J3489 Other specified disorders of nose and nasal sinuses: Secondary | ICD-10-CM | POA: Diagnosis not present

## 2022-01-14 ENCOUNTER — Encounter: Payer: Self-pay | Admitting: Family Medicine

## 2022-01-15 DIAGNOSIS — J324 Chronic pansinusitis: Secondary | ICD-10-CM | POA: Diagnosis not present

## 2022-01-15 DIAGNOSIS — J3489 Other specified disorders of nose and nasal sinuses: Secondary | ICD-10-CM | POA: Diagnosis not present

## 2022-01-28 DIAGNOSIS — L570 Actinic keratosis: Secondary | ICD-10-CM | POA: Diagnosis not present

## 2022-01-28 DIAGNOSIS — L439 Lichen planus, unspecified: Secondary | ICD-10-CM | POA: Diagnosis not present

## 2022-01-28 DIAGNOSIS — L820 Inflamed seborrheic keratosis: Secondary | ICD-10-CM | POA: Diagnosis not present

## 2022-01-28 DIAGNOSIS — D485 Neoplasm of uncertain behavior of skin: Secondary | ICD-10-CM | POA: Diagnosis not present

## 2022-02-08 DIAGNOSIS — J3489 Other specified disorders of nose and nasal sinuses: Secondary | ICD-10-CM | POA: Diagnosis not present

## 2022-02-08 DIAGNOSIS — J324 Chronic pansinusitis: Secondary | ICD-10-CM | POA: Diagnosis not present

## 2022-03-01 DIAGNOSIS — J343 Hypertrophy of nasal turbinates: Secondary | ICD-10-CM | POA: Diagnosis not present

## 2022-03-01 DIAGNOSIS — J3489 Other specified disorders of nose and nasal sinuses: Secondary | ICD-10-CM | POA: Diagnosis not present

## 2022-03-01 DIAGNOSIS — J324 Chronic pansinusitis: Secondary | ICD-10-CM | POA: Diagnosis not present

## 2022-03-15 ENCOUNTER — Other Ambulatory Visit: Payer: Self-pay | Admitting: Family Medicine

## 2022-03-15 ENCOUNTER — Encounter: Payer: Self-pay | Admitting: Family Medicine

## 2022-03-15 ENCOUNTER — Ambulatory Visit (INDEPENDENT_AMBULATORY_CARE_PROVIDER_SITE_OTHER): Payer: BC Managed Care – PPO | Admitting: Family Medicine

## 2022-03-15 VITALS — BP 112/80 | HR 63 | Temp 97.5°F | Ht 63.5 in | Wt 163.5 lb

## 2022-03-15 DIAGNOSIS — E785 Hyperlipidemia, unspecified: Secondary | ICD-10-CM | POA: Diagnosis not present

## 2022-03-15 DIAGNOSIS — Z1231 Encounter for screening mammogram for malignant neoplasm of breast: Secondary | ICD-10-CM

## 2022-03-15 DIAGNOSIS — Z23 Encounter for immunization: Secondary | ICD-10-CM | POA: Diagnosis not present

## 2022-03-15 DIAGNOSIS — I10 Essential (primary) hypertension: Secondary | ICD-10-CM

## 2022-03-15 DIAGNOSIS — Z0000 Encounter for general adult medical examination without abnormal findings: Secondary | ICD-10-CM | POA: Diagnosis not present

## 2022-03-15 MED ORDER — AMLODIPINE BESYLATE 2.5 MG PO TABS: 2.5000 mg | ORAL_TABLET | Freq: Every day | ORAL | 3 refills | Status: DC

## 2022-03-15 NOTE — Progress Notes (Signed)
Dawn Cole ?DOB: Apr 19, 1966 ?Encounter date: 03/15/2022 ? ?This is a 56 y.o. female who presents for complete physical  ? ?History of present illness/Additional concerns: ? ? ?Hypertension: Amlodipine 2.5 mg daily ? ?Regularly follows with gynecology. Has to schedule mammogram for April. Yearly gyn visit in October.  ?  ?Regularly follows with dermatology. She sees them every 6 months, but does self skin checks between and will follow up sooner if concerns. Next visit 5/17.  ? ?Has colonoscopy scheduled for May 2 with Dr. Earlean Shawl. ? ?Bladder is doing ok - harder with jumping exercise.  ? ?Has eye exam coming up as well.  ? ?Seeing dentist regularly.  ? ?Exercises on regular basis - does beach body exercise. Eats healthy for most part. Harder time fighting off cravings now.  ? ?Past Medical History:  ?Diagnosis Date  ? Cancer [facility])   ? 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 Family history of brain cancer   ? Family history of colon cancer   ? Family history of melanoma   ? Family history of ovarian cancer   ? Family history of pancreatic cancer   ? HYPERLIPIDEMIA 10/09/2007  ? HYPERTENSION 01/09/2009  ? Malignant melanoma ([place])   ? ?Past Surgical History:  ?Procedure Laterality Date  ? ABDOMINAL HYSTERECTOMY    ? heavy bleeding/irregular periods; fibroids. no cancer concerns.  ? COLONOSCOPY  02/2017  ? HYSTEROSCOPY    ? x2  ? LIPOMA EXCISION Left 08/02/2017  ? Procedure: WIDE LOCAL EXCISION WITH ADVANCEMENT FLAP CLOSURE LEFT LOWER LEG AND SENTINEL LYMPH NODE MAPPING AND BIOPOSY;  Surgeon: [name], MD;  Location: [place];  Service: General;  Laterality: Left;  GENERAL AND LOCAL  ? MELANOMA EXCISION  2018  ? ?Allergies  ?Allergen Reactions  ? Adhesive [Tape] Other (See Comments)  ?  BANDAIDS- RIPS SKIN OFF   ? ?Current Meds  ?Medication Sig  ? amLODipine (NORVASC) 2.5 MG tablet Take 1 tablet (2.5 mg total) by mouth daily.  ? azelastine (ASTELIN) 0.1 % nasal spray Place 1 spray into both nostrils 2 (two) times daily. Use  in each nostril as directed  ? estradiol (VIVELLE-DOT) 0.075 MG/24HR Place 0.5 patches onto the skin 2 (two) times a week.  ? OVER THE COUNTER MEDICATION Multivitamin with calcium  ? tolterodine (DETROL LA) 2 MG 24 hr capsule Take 2 mg by mouth daily.   ? [DISCONTINUED] amLODipine (NORVASC) 5 MG tablet Take 1 tablet (5 mg total) by mouth daily. (Patient taking differently: Take 2.5 mg by mouth daily.)  ? ?Social History  ? ?Tobacco Use  ? Smoking status: Never  ? Smokeless tobacco: Never  ?Substance Use Topics  ? Alcohol use: No  ? ?Family History  ?Problem Relation Age of Onset  ? Colon cancer Mother   ?     dx in her late 5s, mets to pancreas, liver and lungs  ? High blood pressure Mother   ? High Cholesterol Mother   ? Kidney Stones Mother   ? Brain cancer Father 58  ? COPD Sister   ? Dementia Sister 56  ? Lung cancer Brother 23  ?     heavy smoker  ? Lung cancer Maternal Uncle   ? Colon cancer Maternal Grandfather 52  ? Kidney Stones Sister   ? COPD Sister   ? Drug abuse Sister   ? Pancreatic cancer Brother 79  ?     maternal half brother - was exposed to agent orange  ? Ovarian  cancer Maternal Aunt   ?     dx in her late 59s  ? Lung cancer Maternal Aunt   ? Melanoma Maternal Aunt   ? Multiple myeloma Cousin   ?     maternal first cousin  ? Kidney cancer Cousin   ? Heart failure Paternal Grandmother 83  ? ? ? ?Review of Systems  ?Constitutional:  Negative for activity change, appetite change, chills, fatigue, fever and unexpected weight change.  ?HENT:  Negative for congestion, ear pain, hearing loss, sinus pressure, sinus pain, sore throat and trouble swallowing.   ?Eyes:  Negative for pain and visual disturbance.  ?Respiratory:  Negative for cough, chest tightness, shortness of breath and wheezing.   ?Cardiovascular:  Negative for chest pain, palpitations and leg swelling.  ?Gastrointestinal:  Negative for abdominal pain, blood in stool, constipation, diarrhea, nausea and vomiting.  ?Genitourinary:  Negative  for difficulty urinating and menstrual problem.  ?Musculoskeletal:  Negative for arthralgias and back pain.  ?Skin:  Negative for rash.  ?Neurological:  Negative for dizziness, weakness, numbness and headaches.  ?Hematological:  Negative for adenopathy. Does not bruise/bleed easily.  ?Psychiatric/Behavioral:  Negative for sleep disturbance and suicidal ideas. The patient is not nervous/anxious.   ? ?CBC:  ?Lab Results  ?Component Value Date  ? WBC 8.7 11/17/2020  ? HGB 14.7 11/17/2020  ? HCT 43.4 11/17/2020  ? MCH 29.1 07/22/2017  ? MCHC 33.8 11/17/2020  ? RDW 12.4 11/17/2020  ? PLT 261.0 11/17/2020  ? ?CMP: ?Lab Results  ?Component Value Date  ? NA 137 11/17/2020  ? K 4.9 11/17/2020  ? CL 101 11/17/2020  ? CO2 30 11/17/2020  ? ANIONGAP 6 07/22/2017  ? GLUCOSE 84 11/17/2020  ? BUN 15 11/17/2020  ? CREATININE 0.73 11/17/2020  ? GFRAA >60 07/22/2017  ? CALCIUM 9.8 11/17/2020  ? PROT 7.3 11/17/2020  ? BILITOT 0.6 11/17/2020  ? ALKPHOS 62 11/17/2020  ? ALT 22 11/17/2020  ? AST 24 11/17/2020  ? ?LIPID: ?Lab Results  ?Component Value Date  ? CHOL 226 (A) 11/02/2021  ? TRIG 93 11/02/2021  ? HDL 56 11/02/2021  ? [place] 154 11/02/2021  ? ? ?Objective: ? ?BP 112/80 (BP Location: Left Arm, Patient Position: Sitting, Cuff Size: Normal)   Pulse 63   Temp (!) 97.5 ?F (36.4 ?C) (Oral)   Ht 5' 3.5" (1.613 m)   Wt 163 lb 8 oz (74.2 kg)   SpO2 100%   BMI 28.51 kg/m?   Weight: 163 lb 8 oz (74.2 kg)  ? ?BP Readings from Last 3 Encounters:  ?03/15/22 112/80  ?09/11/21 104/72  ?12/04/20 120/68  ? ?Wt Readings from Last 3 Encounters:  ?03/15/22 163 lb 8 oz (74.2 kg)  ?09/11/21 167 lb 8 oz (76 kg)  ?10/31/20 157 lb 6.4 oz (71.4 kg)  ? ? ?Physical Exam ?Constitutional:   ?   General: She is not in acute distress. ?   Appearance: She is well-developed.  ?HENT:  ?   Head: Normocephalic and atraumatic.  ?   Right Ear: External ear normal.  ?   Left Ear: External ear normal.  ?   Mouth/Throat:  ?   Pharynx: No oropharyngeal exudate.   ?Eyes:  ?   Conjunctiva/sclera: Conjunctivae normal.  ?   Pupils: Pupils are equal, round, and reactive to light.  ?Neck:  ?   Thyroid: No thyromegaly.  ?Cardiovascular:  ?   Rate and Rhythm: Normal rate and regular rhythm.  ?   Heart sounds:  Normal heart sounds. No murmur heard. ?  No friction rub. No gallop.  ?Pulmonary:  ?   Effort: Pulmonary effort is normal.  ?   Breath sounds: Normal breath sounds.  ?Abdominal:  ?   General: Bowel sounds are normal. There is no distension.  ?   Palpations: Abdomen is soft. There is no mass.  ?   Tenderness: There is no abdominal tenderness. There is no guarding.  ?   Hernia: No hernia is present.  ?Musculoskeletal:     ?   General: No tenderness or deformity. Normal range of motion.  ?   Cervical back: Normal range of motion and neck supple.  ?Lymphadenopathy:  ?   Cervical: No cervical adenopathy.  ?Skin: ?   General: Skin is warm and dry.  ?   Findings: No rash.  ?Neurological:  ?   Mental Status: She is alert and oriented to person, place, and time.  ?   Deep Tendon Reflexes: Reflexes normal.  ?   Reflex Scores: ?     Tricep reflexes are 2+ on the right side and 2+ on the left side. ?     Bicep reflexes are 2+ on the right side and 2+ on the left side. ?     Brachioradialis reflexes are 2+ on the right side and 2+ on the left side. ?     Patellar reflexes are 2+ on the right side and 2+ on the left side. ?Psychiatric:     ?   Speech: Speech normal.     ?   Behavior: Behavior normal.     ?   Thought Content: Thought content normal.  ? ? ?Assessment/Plan: ?Health Maintenance Due  ?Topic Date Due  ? Zoster Vaccines- Shingrix (1 of 2) Never done  ? ?Health Maintenance reviewed. ? ?1. Preventative health care ?Keep up with healthy eating, regular exercise.  ? ?2. Essential hypertension ?Blood pressure is well controlled. Continue with amlodipine 2.$RemoveBeforeDEI'5mg'JRHQMeyCIjSZeHJl$  daily.  ? ?3. Dyslipidemia ?Will recheck at next visit.  ? ? ? ?Return in about 6 months (around 09/15/2022) for Chronic  condition visit. ? ?Micheline Rough, MD ? ? ? ? ? ?

## 2022-03-15 NOTE — Addendum Note (Signed)
Addended by: Agnes Lawrence on: 03/15/2022 08:55 AM ? ? Modules accepted: Orders ? ?

## 2022-03-29 ENCOUNTER — Other Ambulatory Visit: Payer: Self-pay | Admitting: Otolaryngology

## 2022-04-08 ENCOUNTER — Ambulatory Visit
Admission: RE | Admit: 2022-04-08 | Discharge: 2022-04-08 | Disposition: A | Discharge status: Home / Self Care | Payer: BC Managed Care – PPO | Source: Ambulatory Visit | Attending: Family Medicine | Admitting: Family Medicine

## 2022-04-08 DIAGNOSIS — Z1231 Encounter for screening mammogram for malignant neoplasm of breast: Secondary | ICD-10-CM

## 2022-04-20 DIAGNOSIS — Z1211 Encounter for screening for malignant neoplasm of colon: Secondary | ICD-10-CM | POA: Diagnosis not present

## 2022-04-20 DIAGNOSIS — Z800 Family history of malignant neoplasm of digestive organs: Secondary | ICD-10-CM | POA: Diagnosis not present

## 2022-04-28 ENCOUNTER — Ambulatory Visit: Admit: 2022-04-28 | Payer: BC Managed Care – PPO | Admitting: Otolaryngology

## 2022-04-28 SURGERY — SURGERY, PARANASAL SINUS, ENDOSCOPIC, WITH NASAL SEPTOPLASTY, TURBINOPLASTY, AND MAXILLARY SINUSOTOMY
Anesthesia: General | Laterality: Bilateral

## 2022-05-05 DIAGNOSIS — L218 Other seborrheic dermatitis: Secondary | ICD-10-CM | POA: Diagnosis not present

## 2022-05-05 DIAGNOSIS — L910 Hypertrophic scar: Secondary | ICD-10-CM | POA: Diagnosis not present

## 2022-05-05 DIAGNOSIS — L814 Other melanin hyperpigmentation: Secondary | ICD-10-CM | POA: Diagnosis not present

## 2022-05-05 DIAGNOSIS — Z85828 Personal history of other malignant neoplasm of skin: Secondary | ICD-10-CM | POA: Diagnosis not present

## 2022-05-05 DIAGNOSIS — D485 Neoplasm of uncertain behavior of skin: Secondary | ICD-10-CM | POA: Diagnosis not present

## 2022-05-05 DIAGNOSIS — Z85820 Personal history of malignant melanoma of skin: Secondary | ICD-10-CM | POA: Diagnosis not present

## 2022-05-05 DIAGNOSIS — L570 Actinic keratosis: Secondary | ICD-10-CM | POA: Diagnosis not present

## 2022-05-05 DIAGNOSIS — D224 Melanocytic nevi of scalp and neck: Secondary | ICD-10-CM | POA: Diagnosis not present

## 2022-08-02 ENCOUNTER — Other Ambulatory Visit: Payer: Self-pay

## 2022-08-02 ENCOUNTER — Other Ambulatory Visit: Payer: Self-pay | Admitting: Otolaryngology

## 2022-08-02 ENCOUNTER — Encounter (HOSPITAL_COMMUNITY): Payer: Self-pay | Admitting: Otolaryngology

## 2022-08-02 NOTE — Progress Notes (Signed)
PCP - Caren Macadam, MD  EKG - DOS  ERAS Protcol - Clears until 0830  Anesthesia review: N  Patient verbally denies any shortness of breath, fever, cough and chest pain during phone call   -------------  SDW INSTRUCTIONS given:  Your procedure is scheduled on 08/04/22.  Report to [facility] Main Entrance "A" at 0900 A.M., and check in at the Admitting office.  Call this number if you have problems the morning of surgery:  [phone]   Remember:  Do not eat after midnight the night before your surgery  You may drink clear liquids until 0830 the morning of your surgery.   Clear liquids allowed are: Water, Non-Citrus Juices (without pulp), Carbonated Beverages, Clear Tea, Black Coffee Only, and Gatorade    Take these medicines the morning of surgery with A SIP OF WATER  amLODipine (NORVASC)  tolterodine (DETROL LA)   As of today, STOP taking any Aspirin (unless otherwise instructed by your surgeon) Aleve, Naproxen, Ibuprofen, Motrin, Advil, Goody's, BC's, all herbal medications, fish oil, and all vitamins.                      Do not wear jewelry, make up, or nail polish            Do not wear lotions, powders, perfumes/colognes, or deodorant.            Do not shave 48 hours prior to surgery.  Men may shave face and neck.            Do not bring valuables to the hospital.            [facility] is not responsible for any belongings or valuables.  Do NOT Smoke (Tobacco/Vaping) 24 hours prior to your procedure If you use a CPAP at night, you may bring all equipment for your overnight stay.   Contacts, glasses, dentures or bridgework may not be worn into surgery.      For patients admitted to the hospital, discharge time will be determined by your treatment team.   Patients discharged the day of surgery will not be allowed to drive home, and someone needs to stay with them for 24 hours.    Special instructions:   [facility]- Preparing For Surgery  Before surgery,  you can play an important role. Because skin is not sterile, your skin needs to be as free of germs as possible. You can reduce the number of germs on your skin by washing with CHG (chlorahexidine gluconate) Soap before surgery.  CHG is an antiseptic cleaner which kills germs and bonds with the skin to continue killing germs even after washing.    Oral Hygiene is also important to reduce your risk of infection.  Remember - BRUSH YOUR TEETH THE MORNING OF SURGERY WITH YOUR REGULAR TOOTHPASTE  Please do not use if you have an allergy to CHG or antibacterial soaps. If your skin becomes reddened/irritated stop using the CHG.  Do not shave (including legs and underarms) for at least 48 hours prior to first CHG shower. It is OK to shave your face.  Please follow these instructions carefully.   Shower the NIGHT BEFORE SURGERY and the MORNING OF SURGERY with DIAL Soap.   Pat yourself dry with a CLEAN TOWEL.  Wear CLEAN PAJAMAS to bed the night before surgery  Place CLEAN SHEETS on your bed the night of your first shower and DO NOT SLEEP WITH PETS.   Day of Surgery:  Please shower morning of surgery  Wear Clean/Comfortable clothing the morning of surgery Do not apply any deodorants/lotions.   Remember to brush your teeth WITH YOUR REGULAR TOOTHPASTE.   Questions were answered. Patient verbalized understanding of instructions.

## 2022-08-03 ENCOUNTER — Other Ambulatory Visit: Payer: Self-pay | Admitting: Otolaryngology

## 2022-08-04 ENCOUNTER — Ambulatory Visit (HOSPITAL_COMMUNITY): Payer: BC Managed Care – PPO | Admitting: Anesthesiology

## 2022-08-04 ENCOUNTER — Other Ambulatory Visit: Payer: Self-pay

## 2022-08-04 ENCOUNTER — Ambulatory Visit (HOSPITAL_COMMUNITY)
Admission: RE | Admit: 2022-08-04 | Discharge: 2022-08-04 | Disposition: A | Discharge status: Home / Self Care | Payer: BC Managed Care – PPO | Attending: Otolaryngology | Admitting: Otolaryngology

## 2022-08-04 ENCOUNTER — Encounter (HOSPITAL_COMMUNITY): Payer: Self-pay | Admitting: Otolaryngology

## 2022-08-04 ENCOUNTER — Encounter (HOSPITAL_COMMUNITY)
Admission: RE | Disposition: A | Discharge status: Home / Self Care | Payer: Self-pay | Source: Home / Self Care | Attending: Otolaryngology

## 2022-08-04 ENCOUNTER — Other Ambulatory Visit (HOSPITAL_COMMUNITY): Payer: Self-pay

## 2022-08-04 DIAGNOSIS — J324 Chronic pansinusitis: Secondary | ICD-10-CM | POA: Diagnosis present

## 2022-08-04 DIAGNOSIS — J339 Nasal polyp, unspecified: Secondary | ICD-10-CM | POA: Diagnosis not present

## 2022-08-04 DIAGNOSIS — J338 Other polyp of sinus: Secondary | ICD-10-CM | POA: Insufficient documentation

## 2022-08-04 DIAGNOSIS — J343 Hypertrophy of nasal turbinates: Secondary | ICD-10-CM | POA: Insufficient documentation

## 2022-08-04 DIAGNOSIS — J342 Deviated nasal septum: Secondary | ICD-10-CM | POA: Insufficient documentation

## 2022-08-04 DIAGNOSIS — Z79899 Other long term (current) drug therapy: Secondary | ICD-10-CM | POA: Diagnosis not present

## 2022-08-04 DIAGNOSIS — J3489 Other specified disorders of nose and nasal sinuses: Secondary | ICD-10-CM | POA: Diagnosis not present

## 2022-08-04 DIAGNOSIS — I10 Essential (primary) hypertension: Secondary | ICD-10-CM | POA: Diagnosis not present

## 2022-08-04 HISTORY — PX: NASAL TURBINATE REDUCTION: SHX2072

## 2022-08-04 HISTORY — PX: SINUS ENDO WITH FUSION: SHX5329

## 2022-08-04 LAB — BASIC METABOLIC PANEL
Anion gap: 5 (ref 5–15)
BUN: 15 mg/dL (ref 6–20)
CO2: 26 mmol/L (ref 22–32)
Calcium: 9.1 mg/dL (ref 8.9–10.3)
Chloride: 104 mmol/L (ref 98–111)
Creatinine, Ser: 0.81 mg/dL (ref 0.44–1.00)
GFR, Estimated: >60 mL/min (ref >60)
Glucose, Bld: 96 mg/dL (ref 70–99)
Potassium: 4.1 mmol/L (ref 3.5–5.1)
Sodium: 135 mmol/L (ref 135–145)

## 2022-08-04 LAB — CBC
HCT: 41 % (ref 36.0–46.0)
Hemoglobin: 14 g/dL (ref 12.0–15.0)
MCH: 30.1 pg (ref 26.0–34.0)
MCHC: 34.1 g/dL (ref 30.0–36.0)
MCV: 88.2 fL (ref 80.0–100.0)
Platelets: 226 10*3/uL (ref 150–400)
RBC: 4.65 MIL/uL (ref 3.87–5.11)
RDW: 11.9 % (ref 11.5–15.5)
WBC: 6.6 10*3/uL (ref 4.0–10.5)
nRBC: 0 % (ref 0.0–0.2)

## 2022-08-04 SURGERY — SURGERY, PARANASAL SINUS, ENDOSCOPIC, WITH NASAL SEPTOPLASTY, TURBINOPLASTY, AND MAXILLARY SINUSOTOMY
Anesthesia: General | Site: Nose | Laterality: Bilateral

## 2022-08-04 MED ORDER — PROPOFOL 10 MG/ML IV BOLUS
INTRAVENOUS | Status: AC
  Filled 2022-08-04: qty 20

## 2022-08-04 MED ORDER — OXYMETAZOLINE HCL 0.05 % NA SOLN
NASAL | Status: DC | PRN
  Administered 2022-08-04: 1

## 2022-08-04 MED ORDER — CEFAZOLIN SODIUM-DEXTROSE 2-3 GM-%(50ML) IV SOLR
INTRAVENOUS | Status: DC | PRN
  Administered 2022-08-04: 2 g via INTRAVENOUS

## 2022-08-04 MED ORDER — EPHEDRINE 5 MG/ML INJ
INTRAVENOUS | Status: AC
  Filled 2022-08-04: qty 5

## 2022-08-04 MED ORDER — EPINEPHRINE HCL (NASAL) 0.1 % NA SOLN
NASAL | Status: AC
  Filled 2022-08-04: qty 30

## 2022-08-04 MED ORDER — MIDAZOLAM HCL 2 MG/2ML IJ SOLN
INTRAMUSCULAR | Status: DC | PRN
  Administered 2022-08-04: 2 mg via INTRAVENOUS

## 2022-08-04 MED ORDER — FENTANYL CITRATE (PF) 250 MCG/5ML IJ SOLN
INTRAMUSCULAR | Status: AC
  Filled 2022-08-04: qty 5

## 2022-08-04 MED ORDER — ONDANSETRON HCL 4 MG/2ML IJ SOLN: 4.0000 mg | Freq: Once | INTRAMUSCULAR | Status: DC | PRN

## 2022-08-04 MED ORDER — BACITRACIN ZINC 500 UNIT/GM EX OINT
TOPICAL_OINTMENT | CUTANEOUS | Status: AC
  Filled 2022-08-04: qty 28.35

## 2022-08-04 MED ORDER — ROCURONIUM BROMIDE 10 MG/ML (PF) SYRINGE
PREFILLED_SYRINGE | INTRAVENOUS | Status: DC | PRN
  Administered 2022-08-04: 10 mg via INTRAVENOUS
  Administered 2022-08-04: 70 mg via INTRAVENOUS

## 2022-08-04 MED ORDER — OXYCODONE HCL 5 MG PO TABS
5.0000 mg | ORAL_TABLET | Freq: Once | ORAL | Status: AC | PRN
  Administered 2022-08-04: 5 mg via ORAL

## 2022-08-04 MED ORDER — 0.9 % SODIUM CHLORIDE (POUR BTL) OPTIME
TOPICAL | Status: DC | PRN
  Administered 2022-08-04: 1000 mL

## 2022-08-04 MED ORDER — ORAL CARE MOUTH RINSE: 15.0000 mL | Freq: Once | OROMUCOSAL | Status: AC

## 2022-08-04 MED ORDER — HEMOSTATIC AGENTS (NO CHARGE) OPTIME
TOPICAL | Status: DC | PRN
  Administered 2022-08-04: 1 via TOPICAL

## 2022-08-04 MED ORDER — DEXAMETHASONE SODIUM PHOSPHATE 10 MG/ML IJ SOLN
INTRAMUSCULAR | Status: AC
  Filled 2022-08-04: qty 1

## 2022-08-04 MED ORDER — ONDANSETRON HCL 4 MG/2ML IJ SOLN
INTRAMUSCULAR | Status: AC
  Filled 2022-08-04: qty 2

## 2022-08-04 MED ORDER — OXYCODONE HCL 5 MG/5ML PO SOLN: 5.0000 mg | Freq: Once | ORAL | Status: AC | PRN

## 2022-08-04 MED ORDER — FENTANYL CITRATE (PF) 100 MCG/2ML IJ SOLN
INTRAMUSCULAR | Status: DC | PRN
  Administered 2022-08-04: 100 ug via INTRAVENOUS
  Administered 2022-08-04: 25 ug via INTRAVENOUS

## 2022-08-04 MED ORDER — SODIUM CHLORIDE 0.9 % IR SOLN
Status: DC | PRN
  Administered 2022-08-04: 1000 mL

## 2022-08-04 MED ORDER — SUGAMMADEX SODIUM 200 MG/2ML IV SOLN
INTRAVENOUS | Status: DC | PRN
  Administered 2022-08-04: 200 mg via INTRAVENOUS

## 2022-08-04 MED ORDER — LIDOCAINE 2% (20 MG/ML) 5 ML SYRINGE
INTRAMUSCULAR | Status: DC | PRN
  Administered 2022-08-04: 100 mg via INTRAVENOUS

## 2022-08-04 MED ORDER — DEXAMETHASONE SODIUM PHOSPHATE 10 MG/ML IJ SOLN
INTRAMUSCULAR | Status: DC | PRN
  Administered 2022-08-04: 5 mg via INTRAVENOUS

## 2022-08-04 MED ORDER — HYDROCODONE-ACETAMINOPHEN 5-325 MG PO TABS
1.0000 | ORAL_TABLET | Freq: Four times a day (QID) | ORAL | 0 refills | Status: AC | PRN
  Filled 2022-08-04: qty 20, 5d supply, fill #0

## 2022-08-04 MED ORDER — CEFAZOLIN SODIUM 1 G IJ SOLR
INTRAMUSCULAR | Status: AC
Start: 2022-08-04 — End: ?
  Filled 2022-08-04: qty 20

## 2022-08-04 MED ORDER — LIDOCAINE-EPINEPHRINE 1 %-1:100000 IJ SOLN
INTRAMUSCULAR | Status: DC | PRN
  Administered 2022-08-04: 10 mL
  Administered 2022-08-04: 4 mL

## 2022-08-04 MED ORDER — LIDOCAINE-EPINEPHRINE 1 %-1:100000 IJ SOLN
INTRAMUSCULAR | Status: AC
  Filled 2022-08-04: qty 1

## 2022-08-04 MED ORDER — PHENYLEPHRINE 80 MCG/ML (10ML) SYRINGE FOR IV PUSH (FOR BLOOD PRESSURE SUPPORT)
PREFILLED_SYRINGE | INTRAVENOUS | Status: DC | PRN
  Administered 2022-08-04: 160 ug via INTRAVENOUS

## 2022-08-04 MED ORDER — LIDOCAINE 2% (20 MG/ML) 5 ML SYRINGE
INTRAMUSCULAR | Status: AC
  Filled 2022-08-04: qty 5

## 2022-08-04 MED ORDER — HYDROMORPHONE HCL 1 MG/ML IJ SOLN: 0.2500 mg | INTRAMUSCULAR | Status: DC | PRN

## 2022-08-04 MED ORDER — MIDAZOLAM HCL 2 MG/2ML IJ SOLN
INTRAMUSCULAR | Status: AC
Start: 2022-08-04 — End: ?
  Filled 2022-08-04: qty 2

## 2022-08-04 MED ORDER — CEFADROXIL 500 MG PO CAPS
500.0000 mg | ORAL_CAPSULE | Freq: Two times a day (BID) | ORAL | 0 refills | Status: AC
  Filled 2022-08-04: qty 20, 10d supply, fill #0

## 2022-08-04 MED ORDER — LACTATED RINGERS IV SOLN: INTRAVENOUS | Status: DC

## 2022-08-04 MED ORDER — DOCUSATE SODIUM 100 MG PO CAPS
100.0000 mg | ORAL_CAPSULE | Freq: Two times a day (BID) | ORAL | 0 refills | Status: AC | PRN
  Filled 2022-08-04: qty 10, 5d supply, fill #0

## 2022-08-04 MED ORDER — PREDNISONE 10 MG PO TABS
ORAL_TABLET | ORAL | 0 refills | Status: AC
  Filled 2022-08-04: qty 30, 12d supply, fill #0

## 2022-08-04 MED ORDER — OXYCODONE HCL 5 MG PO TABS
ORAL_TABLET | ORAL | Status: AC
  Filled 2022-08-04: qty 1

## 2022-08-04 MED ORDER — PROPOFOL 10 MG/ML IV BOLUS
INTRAVENOUS | Status: DC | PRN
  Administered 2022-08-04: 120 mg via INTRAVENOUS

## 2022-08-04 MED ORDER — ROCURONIUM BROMIDE 10 MG/ML (PF) SYRINGE
PREFILLED_SYRINGE | INTRAVENOUS | Status: AC
  Filled 2022-08-04: qty 10

## 2022-08-04 MED ORDER — CHLORHEXIDINE GLUCONATE 0.12 % MT SOLN: 15.0000 mL | Freq: Once | OROMUCOSAL | Status: AC

## 2022-08-04 MED ORDER — CHLORHEXIDINE GLUCONATE 0.12 % MT SOLN
OROMUCOSAL | Status: AC
  Administered 2022-08-04: 15 mL via OROMUCOSAL
  Filled 2022-08-04: qty 15

## 2022-08-04 MED ORDER — BACITRACIN ZINC 500 UNIT/GM EX OINT
TOPICAL_OINTMENT | CUTANEOUS | Status: DC | PRN
  Administered 2022-08-04: 1 via TOPICAL

## 2022-08-04 MED ORDER — ONDANSETRON HCL 4 MG/2ML IJ SOLN
INTRAMUSCULAR | Status: DC | PRN
  Administered 2022-08-04: 4 mg via INTRAVENOUS

## 2022-08-04 MED ORDER — EPHEDRINE SULFATE-NACL 50-0.9 MG/10ML-% IV SOSY
PREFILLED_SYRINGE | INTRAVENOUS | Status: DC | PRN
  Administered 2022-08-04: 15 mg via INTRAVENOUS
  Administered 2022-08-04: 10 mg via INTRAVENOUS

## 2022-08-04 MED ORDER — OXYMETAZOLINE HCL 0.05 % NA SOLN
NASAL | Status: AC
  Filled 2022-08-04: qty 30

## 2022-08-04 MED ORDER — PHENYLEPHRINE 80 MCG/ML (10ML) SYRINGE FOR IV PUSH (FOR BLOOD PRESSURE SUPPORT)
PREFILLED_SYRINGE | INTRAVENOUS | Status: AC
  Filled 2022-08-04: qty 10

## 2022-08-04 SURGICAL SUPPLY — 46 items
BLADE INF TURB ROT M4 2 5PK (BLADE) ×2 IMPLANT
BLADE ROTATE RAD 40 4 M4 (BLADE) IMPLANT
BLADE ROTATE TRICUT 4X13 M4 (BLADE) ×2 IMPLANT
BLADE SURG 15 STRL LF DISP TIS (BLADE) IMPLANT
BLADE SURG 15 STRL SS (BLADE)
CANISTER SUCT 3000ML PPV (MISCELLANEOUS) ×4 IMPLANT
COAGULATOR SUCT 8FR VV (MISCELLANEOUS) ×1 IMPLANT
COAGULATOR SUCT SWTCH 10FR 6 (ELECTROSURGICAL) IMPLANT
DRAPE HALF SHEET 40X57 (DRAPES) IMPLANT
ELECT COATED BLADE 2.86 ST (ELECTRODE) IMPLANT
ELECT REM PT RETURN 9FT ADLT (ELECTROSURGICAL) ×2
ELECTRODE REM PT RTRN 9FT ADLT (ELECTROSURGICAL) ×1 IMPLANT
GAUZE SPONGE 2X2 8PLY STRL LF (GAUZE/BANDAGES/DRESSINGS) ×1 IMPLANT
GLOVE BIO SURGEON STRL SZ 6.5 (GLOVE) ×4 IMPLANT
GOWN STRL REUS W/ TWL LRG LVL3 (GOWN DISPOSABLE) ×1 IMPLANT
GOWN STRL REUS W/TWL LRG LVL3 (GOWN DISPOSABLE) ×2
HEMOSTAT ARISTA ABSORB 3G PWDR (HEMOSTASIS) ×1 IMPLANT
KIT BASIN OR (CUSTOM PROCEDURE TRAY) ×2 IMPLANT
KIT TURNOVER KIT B (KITS) ×2 IMPLANT
NDL HYPO 25GX1X1/2 BEV (NEEDLE) ×2 IMPLANT
NDL SPNL 25GX3.5 QUINCKE BL (NEEDLE) ×1 IMPLANT
NEEDLE HYPO 25GX1X1/2 BEV (NEEDLE) ×4 IMPLANT
NEEDLE SPNL 25GX3.5 QUINCKE BL (NEEDLE) ×2 IMPLANT
NS IRRIG 1000ML POUR BTL (IV SOLUTION) ×2 IMPLANT
PAD ARMBOARD 7.5X6 YLW CONV (MISCELLANEOUS) ×4 IMPLANT
PATTIES SURGICAL .5 X3 (DISPOSABLE) ×2 IMPLANT
SOL ANTI FOG 6CC (MISCELLANEOUS) ×1 IMPLANT
SOLUTION ANTI FOG 6CC (MISCELLANEOUS) ×1
SPLINT NASAL DOYLE BI-VL (GAUZE/BANDAGES/DRESSINGS) IMPLANT
SPLINT NASAL POSISEP X .6X2 (GAUZE/BANDAGES/DRESSINGS) ×1 IMPLANT
SPONGE GAUZE 2X2 STER 10/PKG (GAUZE/BANDAGES/DRESSINGS) ×1
SPONGE NEURO XRAY DETECT 1X3 (DISPOSABLE) ×2 IMPLANT
SUT CHROMIC 4 0 P 3 18 (SUTURE) ×1 IMPLANT
SUT ETHILON 3 0 PS 1 (SUTURE) IMPLANT
SUT PLAIN 4 0 ~~LOC~~ 1 (SUTURE) ×1 IMPLANT
SUT SILK 2 0 SH (SUTURE) ×2 IMPLANT
SYR CONTROL 10ML LL (SYRINGE) ×1 IMPLANT
SYR TB 1ML LUER SLIP (SYRINGE) ×4 IMPLANT
TOWEL GREEN STERILE FF (TOWEL DISPOSABLE) ×2 IMPLANT
TRACKER ENT INSTRUMENT (MISCELLANEOUS) ×4 IMPLANT
TRACKER ENT PATIENT (MISCELLANEOUS) ×2 IMPLANT
TRAY ENT MC OR (CUSTOM PROCEDURE TRAY) ×2 IMPLANT
TUBE CONNECTING 12X1/4 (SUCTIONS) ×2 IMPLANT
TUBE SALEM SUMP 16 FR W/ARV (TUBING) ×2 IMPLANT
TUBING EXTENTION W/L.L. (IV SETS) IMPLANT
TUBING STRAIGHTSHOT EPS 5PK (TUBING) ×2 IMPLANT

## 2022-08-04 NOTE — Discharge Instructions (Signed)
[facility] S[facility] ([facility]) Post Operative Instructions  Office: [phone]  The Surgery Itself Endoscopic sinus surgery (with or without septoplasty and turbinate reduction) involves general anesthesia, typically for one to two hours. Patients may be sedated for several hours after surgery and may remain sleepy for the better part of the day. Nausea and vomiting are occasionally seen, and usually resolve by the evening of surgery - even without additional medications. Almost all patients can go home the day of surgery.  After Surgery  Facial pressure and fullness similar to a sinus infection/headache is normal after surgery. Breathing through your nose is also difficult due to swelling. A humidifier or vaporizer can be used in the bedroom to prevent throat pain with mouth breathing.   Bloody nasal drainage is normal after this surgery for 5-7 days, usually decreasing in volume with each day that passes. Drainage will flow from the front of the nose and down the back of the throat. Make sure you spit out blood drainage that drips down the back of your throat to prevent nausea/vomiting. You will have a nasal drip pad/sling with gauze to catch drainage from the front of your nose. The dressing may need to be changed frequently during the first 24 hours following surgery. In case of profuse nasal bleeding, you may apply ice to the bridge of the nose and pinch the nose just above the tip and hold for 10 minutes; if bleeding continues, contact the doctors office.   Frequent hot showers or saline nasal rinses (NeilMed) will help break up congestion and clear any clot or mucus that builds up within the nose after surgery. This can be started the day after surgery.   It is more comfortable to sleep with extra pillows or in a recliner for the first few days after surgery until the drainage begins to resolve.    Do not blow your nose for 2 weeks after surgery.   Avoid lifting > 10 lbs. and no  vigorous exercise for 2 weeks after Surgery.   Avoid airplane travel for 2 weeks following sinus surgery; the cabin pressure changes can cause pain and swelling within the nose/sinuses.   Sense of smell and taste are often diminished for several weeks after surgery. There may be some tenderness or numbness in your upper front teeth, which is normal after surgery. You may express old clot, discolored mucus or very large nasal crusts from your nose for up to 3-4 weeks after surgery; depending on how frequently and how effectively you irrigate your nose with the saltwater spray.   You may have absorbable sutures inside of your nose after surgery that will slowly dissolve in 2-3 weeks. Be careful when clearing crusts from the nose since they may be attached to these sutures.  Medications  Pain medication can be used for pain as prescribed. Pain and pressure in the nose is expected after surgery. As the surgical site heals, pain will resolve over the course of a week. Pain medications can cause nausea, which can be prevented if you take them with food or milk.   You may be given an antibiotic for one week after surgery to prevent infection. Take this medication with food to prevent nausea or vomiting.   You can use 2 nasal sprays after surgery: Afrin can be used up to 2 times a day for up to 5 days after surgery (best before bed) to reduce bloody drainage from the nose for the first few days after surgery. Saline/salt   water spray can be used as often as you would like starting the day after surgery to prevent crusting inside of the nose.   Take all of your routine medications as prescribed, unless told otherwise by your surgeon. Any medications that thin the blood should be avoided. This includes aspirin. Avoid aspirin-like products for the first 72 hours after surgery (Advil, Motrin, Excedrin, Alieve, Celebrex, Naprosyn), but you may use them as needed for pain after 72 hours.   

## 2022-08-04 NOTE — Anesthesia Procedure Notes (Signed)
Procedure Name: Intubation Date/Time: 08/04/2022 11:44 AM  Performed by: Lance Coon, CRNAPre-anesthesia Checklist: Patient identified, Emergency Drugs available, Suction available, Patient being monitored and Timeout performed Patient Re-evaluated:Patient Re-evaluated prior to induction Oxygen Delivery Method: Circle system utilized Preoxygenation: Pre-oxygenation with 100% oxygen Induction Type: IV induction Ventilation: Mask ventilation without difficulty Laryngoscope Size: Miller and 3 Grade View: Grade I Tube type: Oral Tube size: 7.0 mm Number of attempts: 1 Airway Equipment and Method: Stylet Placement Confirmation: ETT inserted through vocal cords under direct vision, positive ETCO2 and breath sounds checked- equal and bilateral Secured at: 21 cm Tube secured with: Tape Dental Injury: Teeth and Oropharynx as per pre-operative assessment

## 2022-08-04 NOTE — Anesthesia Postprocedure Evaluation (Signed)
Anesthesia Post Note  Patient: KALEIGH SPIEGELMAN  Procedure(s) Performed: FUNCTIONAL ENDOSCOPIC SINUS SURGERY WITH FUSION, WITH BILATERAL MAXILLARY ANTROSTOMY,TOTAL ETHMOIDECTOMY, SPHENOTOMY, FRONTAL RECESS EXPIRATION (Bilateral: Nose) TURBINATE REDUCTION/SUBMUCOSAL RESECTION (Bilateral: Nose)     Patient location during evaluation: PACU Anesthesia Type: General Level of consciousness: awake and alert Pain management: pain level controlled Vital Signs Assessment: post-procedure vital signs reviewed and stable Respiratory status: spontaneous breathing, nonlabored ventilation, respiratory function stable and patient connected to nasal cannula oxygen Cardiovascular status: blood pressure returned to baseline and stable Postop Assessment: no apparent nausea or vomiting Anesthetic complications: no   No notable events documented.  Last Vitals:  Vitals:   08/04/22 0915 08/04/22 1330  BP: 124/69 (!) 140/79  Pulse: (!) 51 91  Resp: 20 14  Temp: 36.4 C 36.5 C  SpO2: 100% 98%    Last Pain:  Vitals:   08/04/22 1330  TempSrc:   PainSc: Asleep                 Saydee Zolman S

## 2022-08-04 NOTE — Op Note (Signed)
OPERATIVE NOTE  Dawn Cole Date/Time of Admission: 08/04/2022  8:52 AM  CSN: [id number];UTM:[id number] Attending Provider: Ebbie Latus A, DO Room/Bed: MCPO/NONE DOB: 1966-11-06 Age: 56 y.o.   Pre-Op Diagnosis: Chronic pansinusitis,Hypertrophy of inferior nasal turbinate and Nasal obstruction  Post-Op Diagnosis: Chronic pansinusitis,Hypertrophy of inferior nasal turbinate and Nasal obstruction  Procedure: Procedure(s): FUNCTIONAL ENDOSCOPIC SINUS SURGERY WITH FUSION, WITH BILATERAL MAXILLARY ANTROSTOMY WITH TISSUE REMOVAL,TOTAL ETHMOIDECTOMY, SPHENOIDOTOMY WITH TISSUE REMOVAL, FRONTAL SINUSOTOMY, WITH BILATERAL INFERIOR TURBINATE REDUCTION/SUBMUCOSAL RESECTION  Anesthesia: General  Surgeon(s): [place], DO  Staff: Circulator: Hal Morales, RN Relief Circulator: Sadie Haber, RN Relief Scrub: Royanne Foots Scrub Person: Dollene Cleveland T  Implants: * No implants in log *  Specimens: ID Type Source Tests Collected by Time Destination  1 : Bilateral Sinus contents Tissue PATH Sinus Contents/Nasal Polyps SURGICAL PATHOLOGY Dawn Cole A, DO 08/04/2022 1206   2 : Right Sinus contents Tissue PATH Sinus Contents/Nasal Polyps SURGICAL PATHOLOGY Dawn Cole A, DO 5/68/1275 1700     Complications: None  EBL: 75 ML  Condition: stable  Operative Findings:  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 High right septal deviation. Copious inspissated secretions in left frontal and maxillary sinuses  Description of Operation: Once operative consent was obtained and the site and surgery were confirmed with the patient and the operating room team, the patient was brought back to the operating room and general endotracheal anesthesia was obtained. The patient was turned over to the ENT service, at which time the image-guided system was attached and noted to be in good calibration. Lidocaine 1% with 1:100,000 epinephrine was injected into the nasal septum  bilaterally, inferior turbinates bilaterally, the middle turbinates bilaterally, and the axilla between the medial turbinate and the lateral nasal wall. Afrin-soaked pledgets were placed into the nasal cavity, and the patient was prepped and draped in sterile fashion. Attention turned to the right-sided sinonasal cavity. A large antrochoanal polyp was noted and was removed using Blakesley forceps.The middle turbinate was medialized and a ball-tipped seeker was used to slightly anterior fracture the uncinate process. The frontal sinus ostium seeker was used to confirm location of the frontal recess and the tract was enlarged to create a large frontal os and recess using a Hosemann and frontal sinus mushroom punch. The frontal sinus was copiously irrigated. Attention was then turned to the uncinate process, which was completely fractured anteriorly and then removed with a combination of backbiter and a microdebrider. A ball-tipped seeker was used to identify the natural os of the maxillary sinus, and it was widened with combination of the backbiter, the microdebrider, the olive tip suction, and the straight TruCut until it was widely patent. Utilizing image-guided suction, the medial inferior quadrant of the ethmoid bulla was entered bluntly, the walls were fractured and the bulla was removed utilizing a microdebrider. Anterior and posterior ethmoidectomy were completed in a standard fashion by first locating the anterior face of the sphenoid sinus and then following the skull base and the lamina papyracea to fully take down all ethmoid cells. This was done utilizing a combination of the straight suction, the J curette, the ball-tipped seeker, and the microdebrider. The os of the sphenoid sinus was identified and opened widely with the microdebrider. Attention was then turned to the inferior turbinate. It was outfractured and then submucous resection was performed by making an incision in the leading edge with a 15  blade, separating the mucosa from bone with a Cottle elevator and then using the micro debrider via  a turbinate blade to remove bone. Epinephrine soaked pledgets were placed in the right sinonasal cavity and attention was turned to the left side. This exact same procedure was performed on the left side of the sinonasal cavity.  Pledgets were removed, copious irrigation was placed in the sinonasal cavities and Posisep absorbable nasal pack was placed lateral to the middle turbinate in the axilla between it and the lateral nasal wall. An orogastric tube was placed and the stomach cavity was suctioned to reduce postoperative nausea. The patient was turned over to anesthesia service and was extubated in the operating room and transferred to the PACU in stable condition. The patient will be discharged today and followed up in the ENT clinic in 1 week for postoperative check.    Jason Coop, [facility]  08/04/2022

## 2022-08-04 NOTE — H&P (Signed)
Dawn Cole is an 56 y.o. female.    Chief Complaint:  Chronic pansinusitis  HPI: Patient presents today for planned elective procedure.  She denies any interval change in history since office visit on 03/01/2022:  Dawn Cole is a 56 y.o. female who presents as a return consult, referred by Ike Bene*, for evaluation and treatment of nasal congestion and recurrent sinusitis. Patient was last seen in our office on 01/15/2022 by Jolene Provost, PA for her symptoms. Patient had recently completed a course of Augmentin and was prescribed a 10-day course of clindamycin for persistent symptoms. She was also counseled to continue nasal saline rinses and use Flonase on a daily basis. Patient denies any significant change in her symptoms following completion of prescribed antibiotics. She had a CT of her sinuses performed on 02/08/2022.  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 no cancer concerns.   COLONOSCOPY  02/2017   HYSTEROSCOPY     x2   LIPOMA EXCISION Left 08/02/2017   Procedure: WIDE LOCAL EXCISION WITH ADVANCEMENT FLAP CLOSURE LEFT LOWER LEG AND SENTINEL LYMPH NODE MAPPING AND BIOPOSY;  Surgeon: [name], MD;  Location: [facility];  Service: General;  Laterality: Left;  GENERAL AND LOCAL   MELANOMA EXCISION  2018    Family History  Problem Relation Age of Onset   Colon cancer Mother        dx in her late 78s, mets to pancreas, liver and lungs   High blood pressure Mother    High Cholesterol Mother    Kidney Stones Mother    Brain cancer  Father 7   COPD Sister    Dementia Sister 26   Kidney Stones Sister    COPD Sister    Drug abuse Sister    Ovarian cancer Maternal Aunt        dx in her late 72s   Lung cancer Maternal Aunt    Melanoma Maternal Aunt    Lung cancer Maternal Uncle    Colon cancer Maternal Grandfather 66   Heart failure Paternal Grandmother 58   Multiple myeloma Cousin        maternal first cousin   Kidney cancer Cousin    Lung cancer Brother 59       heavy smoker   Pancreatic cancer Brother 68       maternal half brother - was exposed to agent orange   Breast cancer Neg Hx     Social History:  reports that she has never smoked. She has never used smokeless tobacco. She reports that she does not drink alcohol and does not use drugs.  Allergies:  Allergies  Allergen Reactions   Adhesive [Tape] Other (See Comments)    BANDAIDS- RIPS SKIN OFF     Medications Prior to Admission  Medication Sig Dispense Refill   amLODipine (NORVASC) 2.5 MG tablet Take 1 tablet (2.5 mg total) by mouth daily. 90 tablet 3   azelastine (ASTELIN) 0.1 % nasal spray Place 1 spray  into both nostrils 2 (two) times daily. Use in each nostril as directed 30 mL 5   Biotin w/ Vitamins C & E (HAIR SKIN & NAILS GUMMIES PO) Take 2 tablets by mouth daily. 600 mcg per serving     estradiol (VIVELLE-DOT) 0.075 MG/24HR Place 0.075 patches onto the skin 2 (two) times a week.     Multiple Vitamins-Minerals (MULTIVITAMIN WITH MINERALS) tablet Take 1 tablet by mouth daily.     Probiotic Product (PROBIOTIC PO) Take 3 tablets by mouth daily. [place] multi enzyme     sodium chloride (OCEAN) 0.65 % SOLN nasal spray Place 1 spray into both nostrils at bedtime as needed for congestion. Simply saline     tolterodine (DETROL LA) 2 MG 24 hr capsule Take 2 mg by mouth every morning.     TURMERIC-GINGER PO Take 2 tablets by mouth daily. 500 mg/50 mg      Results for orders placed or performed during the hospital encounter of 08/04/22 (from  the past 48 hour(s))  CBC per protocol     Status: None   Collection Time: 08/04/22  9:02 AM  Result Value Ref Range   WBC 6.6 4.0 - 10.5 K/uL   RBC 4.65 3.87 - 5.11 MIL/uL   Hemoglobin 14.0 12.0 - 15.0 g/dL   HCT 41.0 36.0 - 46.0 %   MCV 88.2 80.0 - 100.0 fL   MCH 30.1 26.0 - 34.0 pg   MCHC 34.1 30.0 - 36.0 g/dL   RDW 11.9 11.5 - 15.5 %   Platelets 226 150 - 400 K/uL   nRBC 0.0 0.0 - 0.2 %    Comment: Performed at [facility], [place] [address], [place], [place] [address]  Basic metabolic panel per protocol     Status: None   Collection Time: 08/04/22  9:02 AM  Result Value Ref Range   Sodium 135 135 - 145 mmol/L   Potassium 4.1 3.5 - 5.1 mmol/L   Chloride 104 98 - 111 mmol/L   CO2 26 22 - 32 mmol/L   Glucose, Bld 96 70 - 99 mg/dL    Comment: Glucose reference range applies only to samples taken after fasting for at least 8 hours.   BUN 15 6 - 20 mg/dL   Creatinine, Ser 0.81 0.44 - 1.00 mg/dL   Calcium 9.1 8.9 - 10.3 mg/dL   GFR, Estimated >60 >60 mL/min    Comment: (NOTE) Calculated using the CKD-EPI Creatinine Equation (2021)    Anion gap 5 5 - 15    Comment: Performed at [facility] [address], [place], [place] [address]   No results found.  ROS: ROS  Blood pressure 124/69, pulse (!) 51, temperature 97.6 F (36.4 C), temperature source Oral, resp. rate 20, height $RemoveBe'5\' 3"'gvJimMPVR$  (1.6 m), weight 72.3 kg, SpO2 100 %.  PHYSICAL EXAM: Physical Exam Constitutional:      Appearance: Normal appearance.  HENT:     Mouth/Throat:     Mouth: Mucous membranes are moist.  Eyes:     Extraocular Movements: Extraocular movements intact.  Pulmonary:     Effort: Pulmonary effort is normal.  Musculoskeletal:     Cervical back: Neck supple.  Neurological:     General: No focal deficit present.     Mental Status: She is alert. Mental status is at baseline.  Psychiatric:        Mood and Affect: Mood normal.        Behavior: Behavior normal.  Studies Reviewed: Ct  Sinus reviewed   Assessment/Plan Chrisette Man is a 56 y.o. female with longstanding history of nasal congestion, facial pressure, unresponsive to maximal medical therapy.  -To OR today for bilateral functional endoscopic sinus surgery with total ethmoidectomy, maxillary antrostomy, sphenoidotomy and bilateral frontal recess exploration and bilateral inferior turbinate reduction. Risks of surgery, including bleeding, meningitis, leakage of cerebral spinal fluid, eye injury, injury to the tear duct system causing excessive tearing, numbness of the upper teeth and gums, damage to the olfactory nerves causing loss of smell, excessive crust formation after the operation were reviewed with patient. All questions answered.   Arhaan Chesnut A Tonilynn Bieker 08/04/2022, 11:10 AM

## 2022-08-04 NOTE — Anesthesia Preprocedure Evaluation (Signed)
Anesthesia Evaluation  Patient identified by MRN, date of birth, ID band Patient awake    Reviewed: Allergy & Precautions, NPO status , Patient's Chart, lab work & pertinent test results  Airway Mallampati: II  TM Distance: >3 FB Neck ROM: Full    Dental no notable dental hx.    Pulmonary neg pulmonary ROS,    Pulmonary exam normal breath sounds clear to auscultation       Cardiovascular hypertension, Pt. on medications Normal cardiovascular exam Rhythm:Regular Rate:Normal     Neuro/Psych negative neurological ROS  negative psych ROS   GI/Hepatic negative GI ROS, Neg liver ROS,   Endo/Other  negative endocrine ROS  Renal/GU negative Renal ROS  negative genitourinary   Musculoskeletal negative musculoskeletal ROS (+)   Abdominal   Peds negative pediatric ROS (+)  Hematology negative hematology ROS (+)   Anesthesia Other Findings   Reproductive/Obstetrics negative OB ROS                             Anesthesia Physical Anesthesia Plan  ASA: 2  Anesthesia Plan: General   Post-op Pain Management: Ofirmev IV (intra-op)*   Induction: Intravenous  PONV Risk Score and Plan: 3 and Ondansetron, Dexamethasone, Treatment may vary due to age or medical condition and Midazolam  Airway Management Planned: Oral ETT  Additional Equipment:   Intra-op Plan:   Post-operative Plan: Extubation in OR  Informed Consent: I have reviewed the patients History and Physical, chart, labs and discussed the procedure including the risks, benefits and alternatives for the proposed anesthesia with the patient or authorized representative who has indicated his/her understanding and acceptance.     Dental advisory given  Plan Discussed with: CRNA and Surgeon  Anesthesia Plan Comments:         Anesthesia Quick Evaluation

## 2022-08-04 NOTE — Transfer of Care (Signed)
Immediate Anesthesia Transfer of Care Note  Patient: Dawn Cole  Procedure(s) Performed: FUNCTIONAL ENDOSCOPIC SINUS SURGERY WITH FUSION, WITH BILATERAL MAXILLARY ANTROSTOMY,TOTAL ETHMOIDECTOMY, SPHENOTOMY, FRONTAL RECESS EXPIRATION (Bilateral: Nose) TURBINATE REDUCTION/SUBMUCOSAL RESECTION (Bilateral: Nose)  Patient Location: PACU  Anesthesia Type:General  Level of Consciousness: awake and oriented  Airway & Oxygen Therapy: Patient Spontanous Breathing  Post-op Assessment: Report given to RN  Post vital signs: Reviewed and stable  Last Vitals:  Vitals Value Taken Time  BP 140/79 08/04/22 1328  Temp    Pulse 89 08/04/22 1330  Resp 15 08/04/22 1330  SpO2 97 % 08/04/22 1330  Vitals shown include unvalidated device data.  Last Pain:  Vitals:   08/04/22 0937  TempSrc:   PainSc: 0-No pain         Complications: No notable events documented.

## 2022-08-05 ENCOUNTER — Encounter (HOSPITAL_COMMUNITY): Payer: Self-pay | Admitting: Otolaryngology

## 2022-08-05 LAB — SURGICAL PATHOLOGY

## 2022-08-12 DIAGNOSIS — J324 Chronic pansinusitis: Secondary | ICD-10-CM | POA: Diagnosis not present

## 2022-09-01 DIAGNOSIS — Z48813 Encounter for surgical aftercare following surgery on the respiratory system: Secondary | ICD-10-CM | POA: Diagnosis not present

## 2022-09-16 ENCOUNTER — Telehealth: Payer: Self-pay | Admitting: Family Medicine

## 2022-09-16 NOTE — Telephone Encounter (Signed)
LVM for patient to call and choose new provider or let us know if she is changing offices        FYI

## 2022-10-06 DIAGNOSIS — Z01419 Encounter for gynecological examination (general) (routine) without abnormal findings: Secondary | ICD-10-CM | POA: Diagnosis not present

## 2022-10-06 DIAGNOSIS — Z780 Asymptomatic menopausal state: Secondary | ICD-10-CM | POA: Diagnosis not present

## 2022-10-06 DIAGNOSIS — N393 Stress incontinence (female) (male): Secondary | ICD-10-CM | POA: Diagnosis not present

## 2022-10-06 DIAGNOSIS — Z130 Encounter for screening for diseases of the blood and blood-forming organs and certain disorders involving the immune mechanism: Secondary | ICD-10-CM | POA: Diagnosis not present

## 2022-10-06 DIAGNOSIS — Z79890 Hormone replacement therapy (postmenopausal): Secondary | ICD-10-CM | POA: Diagnosis not present

## 2022-10-11 ENCOUNTER — Ambulatory Visit (INDEPENDENT_AMBULATORY_CARE_PROVIDER_SITE_OTHER): Payer: BC Managed Care – PPO | Admitting: Family Medicine

## 2022-10-11 ENCOUNTER — Encounter: Payer: Self-pay | Admitting: Family Medicine

## 2022-10-11 VITALS — BP 118/84 | HR 67 | Temp 98.0°F | Ht 63.0 in | Wt 169.6 lb

## 2022-10-11 DIAGNOSIS — I10 Essential (primary) hypertension: Secondary | ICD-10-CM | POA: Diagnosis not present

## 2022-10-11 DIAGNOSIS — E669 Obesity, unspecified: Secondary | ICD-10-CM | POA: Insufficient documentation

## 2022-10-11 DIAGNOSIS — Z23 Encounter for immunization: Secondary | ICD-10-CM | POA: Diagnosis not present

## 2022-10-11 MED ORDER — SEMAGLUTIDE(0.25 OR 0.5MG/DOS) 2 MG/3ML ~~LOC~~ SOPN: PEN_INJECTOR | SUBCUTANEOUS | 1 refills | Status: DC

## 2022-10-11 MED ORDER — SEMAGLUTIDE (1 MG/DOSE) 4 MG/3ML ~~LOC~~ SOPN: 1.0000 mg | PEN_INJECTOR | SUBCUTANEOUS | 5 refills | Status: DC

## 2022-10-11 NOTE — Assessment & Plan Note (Signed)
I have had an extensive 30 minute conversation today with the patient about healthy eating habits, exercise, calorie and carb goals for sustainable and successful weight loss. I gave the patient caloric and protein daily intake values as well as described the importance of increasing fiber and water intake. I discussed weight loss medications that could be used in the treatment of this patient. Patient wants to try using Wegovy to help her lose weight. I will place orders for this medication and I will see her back in 3 months for follow up.

## 2022-10-11 NOTE — Progress Notes (Signed)
Established Patient Office Visit  Subjective   Patient ID: Dawn Cole, female    DOB: June 08, 1966  Age: 56 y.o. MRN: [id number]  Chief Complaint  Patient presents with   Establish Care    Patient is here for transition of care visit. States she had to have nasal endoscopy this summer due to the chronic sinusitis. Pt reports she still feels a bit congested in her nose but it is better than previous. States her next appt with them will be in February 2023.   Patient reports that she has gained some weight recently, states when she was dieting she was down to 150 pounds. States that recently her eating has been more difficult to control-- when she had lost the weight before she was going to a weight loss center and was on phentermine which worked very well for her. States that she is still exercising 6 days a week and still portioning out her foods, however her cravings have restarted after she stopped taking the phentermine.   HTN -- BP in office performed and is well controlled. She  reports no side effects to the medications, no chest pain, SOB, dizziness or headaches. She has a BP cuff at home and is checking BP regularly, reports they are in the normal range.       Current Outpatient Medications  Medication Instructions   amLODipine (NORVASC) 2.5 mg, Oral, Daily   Biotin w/ Vitamins C & E (HAIR SKIN & NAILS GUMMIES PO) 2 tablets, Oral, Daily, 600 mcg per serving   estradiol (VIVELLE-DOT) 0.075 MG/24HR 0.075 patches, Transdermal, 2 times weekly,     fluticasone (FLONASE ALLERGY RELIEF) 50 MCG/ACT nasal spray 1 spray, Each Nare, Daily   Multiple Vitamins-Minerals (MULTIVITAMIN WITH MINERALS) tablet 1 tablet, Oral, Daily   Probiotic Product (PROBIOTIC PO) 3 tablets, Oral, Daily, [place] multi enzyme   SALINE NA Nasal, 2 times daily   [START ON 12/06/2022] Semaglutide (1 MG/DOSE) 1 mg, Injection, Weekly   sodium chloride (OCEAN) 0.65 % SOLN nasal spray 1 spray, Each Nare, At  bedtime PRN, Simply saline   tolterodine (DETROL LA) 2 mg, Oral, Every morning   TURMERIC-GINGER PO 2 tablets, Oral, Daily, 500 mg/50 mg       Review of Systems  All other systems reviewed and are negative.     Objective:     BP 118/84 (BP Location: Left Arm, Patient Position: Sitting, Cuff Size: Normal)   Pulse 67   Temp 98 F (36.7 C) (Oral)   Ht '5\' 3"'$  (1.6 m)   Wt 169 lb 9.6 oz (76.9 kg)   SpO2 99%   BMI 30.04 kg/m  BP Readings from Last 3 Encounters:  10/11/22 118/84  08/04/22 (!) 160/93  03/15/22 112/80      Physical Exam Vitals reviewed.  Constitutional:      Appearance: Normal appearance. She is well-groomed. She is obese.  Eyes:     Conjunctiva/sclera: Conjunctivae normal.  Neck:     Thyroid: No thyromegaly.  Cardiovascular:     Rate and Rhythm: Normal rate and regular rhythm.     Pulses: Normal pulses.     Heart sounds: S1 normal and S2 normal.  Pulmonary:     Effort: Pulmonary effort is normal.     Breath sounds: Normal breath sounds and air entry.  Abdominal:     General: Bowel sounds are normal.  Musculoskeletal:     Right lower leg: No edema.     Left lower leg: No  edema.  Neurological:     Mental Status: She is alert and oriented to person, place, and time. Mental status is at baseline.     Gait: Gait is intact.  Psychiatric:        Mood and Affect: Mood and affect normal.        Speech: Speech normal.        Behavior: Behavior normal.        Judgment: Judgment normal.      No results found for any visits on 10/11/22.  Last lipids Lab Results  Component Value Date   CHOL 226 (A) 11/02/2021   HDL 56 11/02/2021   LDLCALC 154 11/02/2021   LDLDIRECT 144.2 03/01/2011   TRIG 93 11/02/2021   CHOLHDL 4 11/17/2020      The 10-year ASCVD risk score (Arnett DK, et al., 2019) is: 3.4%* (Cholesterol units were assumed)    Assessment & Plan:   Problem List Items Addressed This Visit       Cardiovascular and Mediastinum   Essential  hypertension - Primary    On amlodipine 2.5 mg daily, BP is well controlled on this dose, will continue as prescribed.        Other   Obesity (BMI 30.0-34.9)    I have had an extensive 30 minute conversation today with the patient about healthy eating habits, exercise, calorie and carb goals for sustainable and successful weight loss. I gave the patient caloric and protein daily intake values as well as described the importance of increasing fiber and water intake. I discussed weight loss medications that could be used in the treatment of this patient. Patient wants to try using Wegovy to help her lose weight. I will place orders for this medication and I will see her back in 3 months for follow up.       Relevant Medications   Semaglutide,0.25 or 0.'5MG'$ /DOS, 2 MG/3ML SOPN   Semaglutide, 1 MG/DOSE, 4 MG/3ML SOPN (Start on 12/06/2022)   Other Visit Diagnoses     Immunization due       Relevant Orders   Varicella-zoster vaccine IM   Need for shingles vaccine       Relevant Orders   Zoster Recombinant (Shingrix ) (Completed)       Return in about 3 months (around 01/11/2023) for weight loss follow up.    Farrel Conners, MD

## 2022-10-11 NOTE — Patient Instructions (Addendum)
Total protein intake for the day: shoot for 85 gram per day  Total fiber intake for the day: 25 grams  Total carb intake: anything less than 90 grams per day.  Total calorie intake: anything less than 1800 per day.

## 2022-10-11 NOTE — Assessment & Plan Note (Signed)
On amlodipine 2.5 mg daily, BP is well controlled on this dose, will continue as prescribed.

## 2022-10-12 ENCOUNTER — Telehealth: Payer: Self-pay | Admitting: *Deleted

## 2022-10-12 DIAGNOSIS — E669 Obesity, unspecified: Secondary | ICD-10-CM

## 2022-10-12 MED ORDER — SEMAGLUTIDE-WEIGHT MANAGEMENT 0.5 MG/0.5ML ~~LOC~~ SOAJ: 0.5000 mg | SUBCUTANEOUS | 0 refills | Status: DC

## 2022-10-12 MED ORDER — SEMAGLUTIDE-WEIGHT MANAGEMENT 0.25 MG/0.5ML ~~LOC~~ SOAJ: 0.2500 mg | SUBCUTANEOUS | 0 refills | Status: DC

## 2022-10-12 MED ORDER — SEMAGLUTIDE-WEIGHT MANAGEMENT 1 MG/0.5ML ~~LOC~~ SOAJ: 1.0000 mg | SUBCUTANEOUS | 5 refills | Status: DC

## 2022-10-12 NOTE — Telephone Encounter (Signed)
Fax received from [employer] stating the request was denied and given to PCP for review. 

## 2022-10-12 NOTE — Telephone Encounter (Signed)
[facility] faxed a prior authorization for Ozempic 0.'25mg'$ .  PA was sent to [email or web address]-key [id number] pending review by insurance.

## 2022-10-13 NOTE — Telephone Encounter (Signed)
Left a detailed message with the information below at the patient's cell number. ?

## 2022-10-14 ENCOUNTER — Encounter: Payer: Self-pay | Admitting: Family Medicine

## 2022-10-18 ENCOUNTER — Telehealth: Payer: Self-pay | Admitting: Family Medicine

## 2022-10-18 NOTE — Telephone Encounter (Signed)
Pharmacy states Semaglutide-Weight Management 0.25 MG/0.5ML SOAJ Semaglutide-Weight Management 0.5 MG/0.5ML SOAJ Semaglutide-Weight Management 1 MG/0.5ML SOAJ needs a PA

## 2022-10-19 NOTE — Telephone Encounter (Signed)
Fax received statng the request was denied and given to PCP for review.

## 2022-10-19 NOTE — Telephone Encounter (Signed)
Prior auth for [facility] 0.'25mg'$  was sent to [email or web address]-Key: [id number] pending review by insurance.

## 2022-10-22 ENCOUNTER — Encounter: Payer: Self-pay | Admitting: Family Medicine

## 2022-10-22 DIAGNOSIS — E669 Obesity, unspecified: Secondary | ICD-10-CM

## 2022-10-22 DIAGNOSIS — E66811 Obesity, class 1: Secondary | ICD-10-CM

## 2022-10-22 MED ORDER — PHENTERMINE HCL 15 MG PO CAPS: 15.0000 mg | ORAL_CAPSULE | ORAL | 0 refills | Status: DC

## 2022-11-08 ENCOUNTER — Encounter: Payer: Self-pay | Admitting: Family Medicine

## 2022-11-09 NOTE — Telephone Encounter (Signed)
Can the prior auth people re-do her PA and add this information to the PA??

## 2022-11-10 NOTE — Telephone Encounter (Signed)
Form, along with office notes from 10/23 was faxed to [facility] Provider Appeals at [phone] and sent to be scanned.

## 2022-11-10 NOTE — Telephone Encounter (Signed)
Form received via fax from [facility] requesting appeal form be completed and given to PCP.

## 2022-11-10 NOTE — Telephone Encounter (Signed)
Prior auth completed.

## 2022-11-12 ENCOUNTER — Other Ambulatory Visit: Payer: Self-pay | Admitting: Family Medicine

## 2022-11-12 DIAGNOSIS — E669 Obesity, unspecified: Secondary | ICD-10-CM

## 2022-11-15 MED ORDER — PHENTERMINE HCL 15 MG PO CAPS: 15.0000 mg | ORAL_CAPSULE | ORAL | 0 refills | Status: DC

## 2022-11-18 ENCOUNTER — Encounter: Payer: Self-pay | Admitting: Family Medicine

## 2022-11-26 NOTE — Telephone Encounter (Signed)
The phentermine was denied by insurance--I'm not really sure why they denied it. there is a coupon on [employer] that reduces the cost to 15 dollars at [employer]. She can download the coupon and pay for the medication without insurance.   Or if she wants to try having the pharmacist cancel the prior auth for the ozempic and then try to run the phentermine rx again

## 2022-11-26 NOTE — Telephone Encounter (Signed)
Fax received from [facility] stating the request was denied and given to PCP for review.

## 2022-12-03 DIAGNOSIS — L570 Actinic keratosis: Secondary | ICD-10-CM | POA: Diagnosis not present

## 2022-12-03 DIAGNOSIS — D485 Neoplasm of uncertain behavior of skin: Secondary | ICD-10-CM | POA: Diagnosis not present

## 2022-12-03 DIAGNOSIS — B078 Other viral warts: Secondary | ICD-10-CM | POA: Diagnosis not present

## 2022-12-03 DIAGNOSIS — L820 Inflamed seborrheic keratosis: Secondary | ICD-10-CM | POA: Diagnosis not present

## 2022-12-03 DIAGNOSIS — L237 Allergic contact dermatitis due to plants, except food: Secondary | ICD-10-CM | POA: Diagnosis not present

## 2022-12-17 ENCOUNTER — Other Ambulatory Visit: Payer: Self-pay | Admitting: Family Medicine

## 2022-12-17 ENCOUNTER — Other Ambulatory Visit (HOSPITAL_COMMUNITY): Payer: Self-pay

## 2022-12-17 DIAGNOSIS — E66811 Obesity, class 1: Secondary | ICD-10-CM

## 2022-12-17 DIAGNOSIS — E669 Obesity, unspecified: Secondary | ICD-10-CM

## 2022-12-21 MED ORDER — PHENTERMINE HCL 15 MG PO CAPS: 15.0000 mg | ORAL_CAPSULE | ORAL | 0 refills | Status: DC

## 2023-01-11 ENCOUNTER — Ambulatory Visit: Payer: No Typology Code available for payment source | Admitting: Family Medicine

## 2023-01-11 ENCOUNTER — Encounter: Payer: Self-pay | Admitting: Family Medicine

## 2023-01-11 VITALS — BP 112/76 | HR 70 | Temp 97.9°F | Ht 63.0 in | Wt 164.3 lb

## 2023-01-11 DIAGNOSIS — E785 Hyperlipidemia, unspecified: Secondary | ICD-10-CM | POA: Diagnosis not present

## 2023-01-11 DIAGNOSIS — E669 Obesity, unspecified: Secondary | ICD-10-CM | POA: Diagnosis not present

## 2023-01-11 LAB — LIPID PANEL
Cholesterol: 231 mg/dL — ABNORMAL HIGH (ref 0–200)
HDL: 58.2 mg/dL (ref >39.00)
LDL Cholesterol: 154 mg/dL — ABNORMAL HIGH (ref 0–99)
NonHDL: 173.02
Total CHOL/HDL Ratio: 4
Triglycerides: 95 mg/dL (ref 0.0–149.0)
VLDL: 19 mg/dL (ref 0.0–40.0)

## 2023-01-11 MED ORDER — PHENTERMINE HCL 15 MG PO CAPS: 15.0000 mg | ORAL_CAPSULE | ORAL | 0 refills | Status: DC

## 2023-01-11 MED ORDER — TOPIRAMATE 25 MG PO TABS: 25.0000 mg | ORAL_TABLET | Freq: Every day | ORAL | 2 refills | Status: DC

## 2023-01-11 NOTE — Progress Notes (Signed)
Established Patient Office Visit  Subjective   Patient ID: Dawn Cole, female    DOB: 10-Sep-1966  Age: 57 y.o. MRN: [id number]  Chief Complaint  Patient presents with   Follow-up    Patient is here for follow up on weight loss. Patient states that she is doing well on the phentermine 15 mg capsules, however she states that she isn't losing as quickly as she wants. Has lost 5 pounds since her last visit. BMI is down to 29 today. States that she is working out every day. She denies any side effects to the phentermine currently. We discussed adding topiramate at night to help with the cravings and she is agreeable.   HLD-- pt reports having high TG in the past, she is not currently on medication for this, managing through diet and exercise.        Current Outpatient Medications  Medication Instructions   amLODipine (NORVASC) 2.5 mg, Oral, Daily   Biotin w/ Vitamins C & E (HAIR SKIN & NAILS GUMMIES PO) 2 tablets, Oral, Daily, 600 mcg per serving   estradiol (VIVELLE-DOT) 0.075 MG/24HR 0.075 patches, Transdermal, 2 times weekly,     fluticasone (FLONASE ALLERGY RELIEF) 50 MCG/ACT nasal spray 1 spray, Each Nare, Daily   Multiple Vitamins-Minerals (MULTIVITAMIN WITH MINERALS) tablet 1 tablet, Oral, Daily   phentermine 15 mg, Oral, BH-each morning   [START ON 02/09/2023] phentermine 15 mg, Oral, BH-each morning   [START ON 03/07/2023] phentermine 15 mg, Oral, BH-each morning   Probiotic Product (PROBIOTIC PO) 3 tablets, Oral, Daily, [place] multi enzyme   SALINE NA Nasal, 2 times daily   sodium chloride (OCEAN) 0.65 % SOLN nasal spray 1 spray, Each Nare, At bedtime PRN, Simply saline   tolterodine (DETROL LA) 2 mg, Oral, Every morning   topiramate (TOPAMAX) 25 mg, Oral, Daily at bedtime   TURMERIC-GINGER PO 2 tablets, Oral, Daily, 500 mg/50 mg    Patient Active Problem List   Diagnosis Date Noted   Obesity (BMI 30.0-34.9) 10/11/2022   Chronic pansinusitis 08/04/2022    Menopause present 01/31/2018   Urinary incontinence 01/31/2018   Genetic testing 09/27/2017   Family history of brain cancer    Family history of pancreatic cancer    Family history of colon cancer    Family history of ovarian cancer    Family history of melanoma    Malignant melanoma ([place])    Family history of cancer 05/29/2013   Essential hypertension 01/09/2009   Dyslipidemia 10/09/2007      Review of Systems  All other systems reviewed and are negative.     Objective:     BP 112/76 (BP Location: Left Arm, Patient Position: Sitting, Cuff Size: Normal)   Pulse 70   Temp 97.9 F (36.6 C) (Oral)   Ht '5\' 3"'$  (1.6 m)   Wt 164 lb 4.8 oz (74.5 kg)   SpO2 97%   BMI 29.10 kg/m    Physical Exam Vitals reviewed.  Constitutional:      Appearance: Normal appearance. She is well-groomed and overweight.  Cardiovascular:     Rate and Rhythm: Normal rate and regular rhythm.     Pulses: Normal pulses.     Heart sounds: S1 normal and S2 normal.  Pulmonary:     Effort: Pulmonary effort is normal.     Breath sounds: Normal breath sounds and air entry.  Neurological:     Mental Status: She is alert and oriented to person, place, and time. Mental status  is at baseline.     Gait: Gait is intact.  Psychiatric:        Mood and Affect: Mood and affect normal.        Speech: Speech normal.        Behavior: Behavior normal.        Judgment: Judgment normal.       The 10-year ASCVD risk score (Arnett DK, et al., 2019) is: 3%    Assessment & Plan:   Problem List Items Addressed This Visit       Unprioritized   Dyslipidemia - Primary    Controlled with diet and exercise, needs her lipid panel rechecked for this year.      Relevant Orders   Lipid Panel (Completed)   Obesity (BMI 30.0-34.9)    Patient has lost about 5 pounds since her last visit, will refill the phentermine 15 mg daily and add topiramate 25 mg at bedtime. She will continue the goals we discussed at the last  visit. RTC in 3 months.      Relevant Medications   phentermine 15 MG capsule   phentermine 15 MG capsule (Start on 02/09/2023)   phentermine 15 MG capsule (Start on 03/07/2023)   topiramate (TOPAMAX) 25 MG tablet    Return in about 3 months (around 04/12/2023) for follow up weight loss.    Farrel Conners, MD

## 2023-01-11 NOTE — Assessment & Plan Note (Signed)
Patient has lost about 5 pounds since her last visit, will refill the phentermine 15 mg daily and add topiramate 25 mg at bedtime. She will continue the goals we discussed at the last visit. RTC in 3 months.

## 2023-01-11 NOTE — Assessment & Plan Note (Signed)
Controlled with diet and exercise, needs her lipid panel rechecked for this year.

## 2023-03-17 ENCOUNTER — Other Ambulatory Visit: Payer: Self-pay | Admitting: Family Medicine

## 2023-03-17 DIAGNOSIS — E66811 Obesity, class 1: Secondary | ICD-10-CM

## 2023-03-17 DIAGNOSIS — E669 Obesity, unspecified: Secondary | ICD-10-CM

## 2023-03-21 MED ORDER — PHENTERMINE HCL 15 MG PO CAPS: 15.0000 mg | ORAL_CAPSULE | ORAL | 0 refills | Status: DC

## 2023-03-22 ENCOUNTER — Other Ambulatory Visit: Payer: Self-pay | Admitting: Gynecology

## 2023-03-22 DIAGNOSIS — Z1231 Encounter for screening mammogram for malignant neoplasm of breast: Secondary | ICD-10-CM

## 2023-03-23 ENCOUNTER — Telehealth: Payer: Self-pay | Admitting: *Deleted

## 2023-03-23 NOTE — Telephone Encounter (Signed)
[facility] faxed a prior authorization request for Phentermine 15mg  capsules.  PA was sent to [email or web address]-KeyRM:[id number] pending review by insurance.

## 2023-03-24 ENCOUNTER — Encounter: Payer: Self-pay | Admitting: Family Medicine

## 2023-03-24 MED ORDER — AMLODIPINE BESYLATE 2.5 MG PO TABS: 2.5000 mg | ORAL_TABLET | Freq: Every day | ORAL | 1 refills | Status: DC

## 2023-03-24 NOTE — Telephone Encounter (Signed)
Ok to refill amlodipine 

## 2023-03-28 ENCOUNTER — Encounter: Payer: Self-pay | Admitting: Family Medicine

## 2023-04-05 ENCOUNTER — Telehealth: Payer: Self-pay

## 2023-04-05 NOTE — Telephone Encounter (Signed)
Pharmacy Patient Advocate Encounter  Received notification from [employer] that the request for prior authorization for Phentermine  has been denied due to:      Please be advised we currently do not have a Pharmacist to review denials, therefore you will need to process appeals accordingly as needed. Thanks for your support at this time.   You may fax [phone] to appeal.  Denial letter attached to charts

## 2023-04-05 NOTE — Telephone Encounter (Signed)
Ok -- pt may pay out of pocket for this medication if she would like-- I think there is a coupon on [email or web address]

## 2023-04-05 NOTE — Telephone Encounter (Signed)
Left a detailed message at the patient's cell number with the information below.   

## 2023-04-12 ENCOUNTER — Encounter: Payer: Self-pay | Admitting: Family Medicine

## 2023-04-12 ENCOUNTER — Ambulatory Visit: Payer: No Typology Code available for payment source | Admitting: Family Medicine

## 2023-04-12 VITALS — BP 110/80 | HR 84 | Temp 97.5°F | Ht 63.0 in | Wt 164.1 lb

## 2023-04-12 DIAGNOSIS — E669 Obesity, unspecified: Secondary | ICD-10-CM | POA: Diagnosis not present

## 2023-04-12 DIAGNOSIS — J324 Chronic pansinusitis: Secondary | ICD-10-CM | POA: Diagnosis not present

## 2023-04-12 MED ORDER — PHENTERMINE HCL 30 MG PO CAPS
30.0000 mg | ORAL_CAPSULE | ORAL | 0 refills | Status: DC
Start: 2023-04-12 — End: 2023-08-11

## 2023-04-12 MED ORDER — PHENTERMINE HCL 30 MG PO CAPS: 30.0000 mg | ORAL_CAPSULE | ORAL | 0 refills | Status: DC

## 2023-04-12 MED ORDER — PHENTERMINE HCL 30 MG PO CAPS
30.0000 mg | ORAL_CAPSULE | ORAL | 0 refills | Status: DC
Start: 2023-05-11 — End: 2023-08-11

## 2023-04-12 NOTE — Progress Notes (Signed)
Established Patient Office Visit  Subjective   Patient ID: EVEE LISKA, female    DOB: 1966-05-19  Age: 57 y.o. MRN: [id number]  Chief Complaint  Patient presents with   Medical Management of Chronic Issues    Patient is here for follow up today. She reports she "fell off" her diet a little over the last 3 months. States that her husband eats different foods and she gets tired of making 2 different meals. Did start the topiramate at night, initially it made her feel sick, but thinks it is somewhat making a difference in her appetite. States that there are still.  Pansinusitis-- patent states that she has been having increased nasal drainage and sinus congestion in the last week or so. No fever or chills. States that her throat is sore from the drainage, thought maybe initially she was feeling poorly over the summer. No ear pain, no sinus pain but there is some pressure. States that the color is turning more green than it was before.    Current Outpatient Medications  Medication Instructions   amLODipine (NORVASC) 2.5 mg, Oral, Daily   Biotin w/ Vitamins C & E (HAIR SKIN & NAILS GUMMIES PO) 2 tablets, Oral, Daily, 600 mcg per serving   estradiol (VIVELLE-DOT) 0.075 MG/24HR 0.075 patches, Transdermal, 2 times weekly,     fluticasone (FLONASE ALLERGY RELIEF) 50 MCG/ACT nasal spray 1 spray, Each Nare, Daily   Multiple Vitamins-Minerals (MULTIVITAMIN WITH MINERALS) tablet 1 tablet, Oral, Daily   [START ON 06/09/2023] phentermine 30 mg, Oral, BH-each morning   [START ON 05/11/2023] phentermine 30 mg, Oral, BH-each morning   phentermine 30 mg, Oral, BH-each morning   Probiotic Product (PROBIOTIC PO) 3 tablets, Oral, Daily, [place] multi enzyme   SALINE NA Nasal, 2 times daily   sodium chloride (OCEAN) 0.65 % SOLN nasal spray 1 spray, Each Nare, At bedtime PRN, Simply saline   tolterodine (DETROL LA) 2 mg, Oral, Every morning   topiramate (TOPAMAX) 25 mg, Oral, Daily at bedtime    TURMERIC-GINGER PO 2 tablets, Oral, Daily, 500 mg/50 mg    Patient Active Problem List   Diagnosis Date Noted   Obesity (BMI 30.0-34.9) 10/11/2022   Chronic pansinusitis 08/04/2022   Menopause present 01/31/2018   Urinary incontinence 01/31/2018   Genetic testing 09/27/2017   Family history of brain cancer    Family history of pancreatic cancer    Family history of colon cancer    Family history of ovarian cancer    Family history of melanoma    Malignant melanoma    Family history of cancer 05/29/2013   Essential hypertension 01/09/2009   Dyslipidemia 10/09/2007      Review of Systems  All other systems reviewed and are negative.     Objective:     BP 110/80 (BP Location: Left Arm, Patient Position: Sitting, Cuff Size: Normal)   Pulse 84   Temp (!) 97.5 F (36.4 C) (Oral)   Ht  (1.6 m)   Wt 164 lb 1.6 oz (74.4 kg)   SpO2 98%   BMI 29.07 kg/m    Physical Exam Vitals reviewed.  Constitutional:      Appearance: Normal appearance. She is well-groomed and normal weight.  HENT:     Right Ear: Tympanic membrane and ear canal normal.     Left Ear: Tympanic membrane and ear canal normal.     Nose: Congestion present.     Mouth/Throat:     Mouth: Mucous membranes are  moist.     Pharynx: No posterior oropharyngeal erythema.  Eyes:     Conjunctiva/sclera: Conjunctivae normal.  Neck:     Thyroid: No thyromegaly.  Cardiovascular:     Rate and Rhythm: Normal rate and regular rhythm.     Pulses: Normal pulses.     Heart sounds: S1 normal and S2 normal.  Pulmonary:     Effort: Pulmonary effort is normal.     Breath sounds: Normal breath sounds and air entry.  Abdominal:     General: Bowel sounds are normal.  Musculoskeletal:     Right lower leg: No edema.     Left lower leg: No edema.  Lymphadenopathy:     Cervical: No cervical adenopathy.  Neurological:     Mental Status: She is alert and oriented to person, place, and time. Mental status is at baseline.      Gait: Gait is intact.  Psychiatric:        Mood and Affect: Mood and affect normal.        Speech: Speech normal.        Behavior: Behavior normal.        Judgment: Judgment normal.      No results found for any visits on 04/12/23.    The 10-year ASCVD risk score (Arnett DK, et al., 2019) is: 2.9%    Assessment & Plan:   Problem List Items Addressed This Visit       Unprioritized   Chronic pansinusitis    Patient is having increasing congestion, I do not think there is acute bacterial infection at this time, I recommended adding a antihistamine once daily due to the allergens in the air. If this does not improve her symptoms she was instructed to call the office and I will call in a medrol dose pak.       Obesity (BMI 30.0-34.9) - Primary    Weight had remained stable on 15 mg phentermine and 25 mg topiramate daily, will increase phentermine to 30 mg capsules daily and see back in 3 months for follow up,       Relevant Medications   phentermine 30 MG capsule (Start on 06/09/2023)   phentermine 30 MG capsule (Start on 05/11/2023)   phentermine 30 MG capsule    Return in about 4 months (around 08/12/2023) for annual physical exam.    Karie Georges, MD

## 2023-04-12 NOTE — Assessment & Plan Note (Signed)
Weight had remained stable on 15 mg phentermine and 25 mg topiramate daily, will increase phentermine to 30 mg capsules daily and see back in 3 months for follow up,

## 2023-04-12 NOTE — Assessment & Plan Note (Signed)
Patient is having increasing congestion, I do not think there is acute bacterial infection at this time, I recommended adding a antihistamine once daily due to the allergens in the air. If this does not improve her symptoms she was instructed to call the office and I will call in a medrol dose pak.

## 2023-04-16 ENCOUNTER — Other Ambulatory Visit: Payer: Self-pay | Admitting: Family Medicine

## 2023-04-16 DIAGNOSIS — E669 Obesity, unspecified: Secondary | ICD-10-CM

## 2023-05-04 ENCOUNTER — Ambulatory Visit
Admission: RE | Admit: 2023-05-04 | Discharge: 2023-05-04 | Disposition: A | Discharge status: Home / Self Care | Payer: No Typology Code available for payment source | Source: Ambulatory Visit | Attending: Gynecology | Admitting: Gynecology

## 2023-05-04 DIAGNOSIS — Z1231 Encounter for screening mammogram for malignant neoplasm of breast: Secondary | ICD-10-CM

## 2023-05-18 ENCOUNTER — Other Ambulatory Visit: Payer: Self-pay | Admitting: Family Medicine

## 2023-05-18 DIAGNOSIS — E669 Obesity, unspecified: Secondary | ICD-10-CM

## 2023-05-18 DIAGNOSIS — E66811 Obesity, class 1: Secondary | ICD-10-CM

## 2023-06-15 ENCOUNTER — Other Ambulatory Visit: Payer: Self-pay | Admitting: Family Medicine

## 2023-06-15 DIAGNOSIS — E669 Obesity, unspecified: Secondary | ICD-10-CM

## 2023-06-16 ENCOUNTER — Other Ambulatory Visit: Payer: Self-pay | Admitting: Family Medicine

## 2023-06-16 DIAGNOSIS — E669 Obesity, unspecified: Secondary | ICD-10-CM

## 2023-06-20 MED ORDER — PHENTERMINE HCL 30 MG PO CAPS
30.0000 mg | ORAL_CAPSULE | ORAL | 0 refills | Status: DC
Start: 2023-06-20 — End: 2023-08-11

## 2023-07-20 ENCOUNTER — Encounter (INDEPENDENT_AMBULATORY_CARE_PROVIDER_SITE_OTHER): Payer: Self-pay

## 2023-08-11 ENCOUNTER — Encounter: Payer: Self-pay | Admitting: Family Medicine

## 2023-08-11 ENCOUNTER — Ambulatory Visit (INDEPENDENT_AMBULATORY_CARE_PROVIDER_SITE_OTHER): Payer: No Typology Code available for payment source | Admitting: Family Medicine

## 2023-08-11 VITALS — BP 110/80 | HR 70 | Temp 97.7°F | Ht 63.5 in | Wt 169.1 lb

## 2023-08-11 DIAGNOSIS — Z0000 Encounter for general adult medical examination without abnormal findings: Secondary | ICD-10-CM

## 2023-08-11 DIAGNOSIS — E669 Obesity, unspecified: Secondary | ICD-10-CM | POA: Diagnosis not present

## 2023-08-11 LAB — COMPREHENSIVE METABOLIC PANEL
ALT: 21 U/L (ref 0–35)
AST: 21 U/L (ref 0–37)
Albumin: 4.3 g/dL (ref 3.5–5.2)
Alkaline Phosphatase: 77 U/L (ref 39–117)
BUN: 17 mg/dL (ref 6–23)
CO2: 30 mEq/L (ref 19–32)
Calcium: 9.8 mg/dL (ref 8.4–10.5)
Chloride: 100 mEq/L (ref 96–112)
Creatinine, Ser: 0.81 mg/dL (ref 0.40–1.20)
GFR: 79.71 mL/min (ref >60.00)
Glucose, Bld: 80 mg/dL (ref 70–99)
Potassium: 4.2 mEq/L (ref 3.5–5.1)
Sodium: 137 mEq/L (ref 135–145)
Total Bilirubin: 0.5 mg/dL (ref 0.2–1.2)
Total Protein: 7.6 g/dL (ref 6.0–8.3)

## 2023-08-11 LAB — CBC WITH DIFFERENTIAL/PLATELET
Basophils Absolute: 0 10*3/uL (ref 0.0–0.1)
Basophils Relative: 0.5 % (ref 0.0–3.0)
Eosinophils Absolute: 0.2 10*3/uL (ref 0.0–0.7)
Eosinophils Relative: 2.3 % (ref 0.0–5.0)
HCT: 42.8 % (ref 36.0–46.0)
Hemoglobin: 14.2 g/dL (ref 12.0–15.0)
Lymphocytes Relative: 19 % (ref 12.0–46.0)
Lymphs Abs: 1.9 10*3/uL (ref 0.7–4.0)
MCHC: 33.1 g/dL (ref 30.0–36.0)
MCV: 87.7 fl (ref 78.0–100.0)
Monocytes Absolute: 0.8 10*3/uL (ref 0.1–1.0)
Monocytes Relative: 8.6 % (ref 3.0–12.0)
Neutro Abs: 6.8 10*3/uL (ref 1.4–7.7)
Neutrophils Relative %: 69.6 % (ref 43.0–77.0)
Platelets: 271 10*3/uL (ref 150.0–400.0)
RBC: 4.88 Mil/uL (ref 3.87–5.11)
RDW: 13 % (ref 11.5–15.5)
WBC: 9.8 10*3/uL (ref 4.0–10.5)

## 2023-08-11 MED ORDER — TOPIRAMATE 50 MG PO TABS: 50.0000 mg | ORAL_TABLET | Freq: Every day | ORAL | 1 refills | Status: DC

## 2023-08-11 MED ORDER — PHENTERMINE HCL 30 MG PO CAPS: 30.0000 mg | ORAL_CAPSULE | ORAL | 0 refills | Status: DC

## 2023-08-11 NOTE — Progress Notes (Signed)
Complete physical exam  Patient: Dawn Cole   DOB: 10/07/1966   57 y.o. Female  MRN: [id number]  Subjective:    Chief Complaint  Patient presents with   Annual Exam    Dawn Cole is a 57 y.o. female who presents today for a complete physical exam. She reports consuming a  low calorie, low carb  diet.  Weight lifting 5 days a week plus walking daily in the mornings  She generally feels well. She reports sleeping well. She does not have additional problems to discuss today.    Most recent fall risk assessment:     No data to display           Most recent depression screenings:    08/11/2023    8:41 AM 04/12/2023    8:39 AM  PHQ 2/9 Scores  PHQ - 2 Score 0 0  PHQ- 9 Score 2 0    Vision:has an eye doctor, wears contacts, has been seen in the last year and Dental: No current dental problems and Receives regular dental care  Patient Active Problem List   Diagnosis Date Noted   Obesity (BMI 30.0-34.9) 10/11/2022   Chronic pansinusitis 08/04/2022   Menopause present 01/31/2018   Urinary incontinence 01/31/2018   Genetic testing 09/27/2017   Family history of brain cancer    Family history of pancreatic cancer    Family history of colon cancer    Family history of ovarian cancer    Family history of melanoma    Malignant melanoma (HCC)    Family history of cancer 05/29/2013   Essential hypertension 01/09/2009   Dyslipidemia 10/09/2007      Patient Care Team: Karie Georges, MD as PCP - General (Family Medicine) Aris Lot, MD as Consulting Physician (Dermatology) Key, Verita Schneiders, NP as Nurse Practitioner (Gynecology)   Outpatient Medications Prior to Visit  Medication Sig   amLODipine (NORVASC) 2.5 MG tablet Take 1 tablet (2.5 mg total) by mouth daily.   Biotin w/ Vitamins C & E (HAIR SKIN & NAILS GUMMIES PO) Take 2 tablets by mouth daily. 600 mcg per serving   estradiol (VIVELLE-DOT) 0.075 MG/24HR Place 0.075 patches onto the skin 2 (two) times a  week.   fluticasone (FLONASE ALLERGY RELIEF) 50 MCG/ACT nasal spray Place 1 spray into both nostrils daily.   Multiple Vitamins-Minerals (MULTIVITAMIN WITH MINERALS) tablet Take 1 tablet by mouth daily.   Probiotic Product (PROBIOTIC PO) Take 3 tablets by mouth daily. [place] multi enzyme   SALINE NA Place into the nose in the morning and at bedtime.   sodium chloride (OCEAN) 0.65 % SOLN nasal spray Place 1 spray into both nostrils at bedtime as needed for congestion. Simply saline   tolterodine (DETROL LA) 2 MG 24 hr capsule Take 2 mg by mouth every morning.   TURMERIC-GINGER PO Take 2 tablets by mouth daily. 500 mg/50 mg   [DISCONTINUED] phentermine 30 MG capsule Take 1 capsule (30 mg total) by mouth every morning.   [DISCONTINUED] phentermine 30 MG capsule Take 1 capsule (30 mg total) by mouth every morning.   [DISCONTINUED] phentermine 30 MG capsule Take 1 capsule (30 mg total) by mouth every morning.   [DISCONTINUED] topiramate (TOPAMAX) 25 MG tablet TAKE 1 TABLET BY MOUTH ONCE DAILY AT BEDTIME   No facility-administered medications prior to visit.    Review of Systems  HENT:  Negative for hearing loss.   Eyes:  Negative for blurred vision.  Respiratory:  Negative  for shortness of breath.   Cardiovascular:  Negative for chest pain.  Gastrointestinal: Negative.   Genitourinary: Negative.   Musculoskeletal:  Negative for back pain.  Neurological:  Negative for headaches.  Psychiatric/Behavioral:  Negative for depression.   All other systems reviewed and are negative.      Objective:     BP 110/80 (BP Location: Left Arm, Patient Position: Sitting, Cuff Size: Normal)   Pulse 70   Temp 97.7 F (36.5 C) (Oral)   Ht 5' 3.5" (1.613 m)   Wt 169 lb 1.6 oz (76.7 kg)   SpO2 99%   BMI 29.48 kg/m    Physical Exam Vitals reviewed.  Constitutional:      Appearance: Normal appearance. She is well-groomed and normal weight.  HENT:     Right Ear: Tympanic membrane and ear  canal normal.     Left Ear: Tympanic membrane and ear canal normal.     Mouth/Throat:     Mouth: Mucous membranes are moist.     Pharynx: No posterior oropharyngeal erythema.  Eyes:     Conjunctiva/sclera: Conjunctivae normal.  Neck:     Thyroid: No thyromegaly.  Cardiovascular:     Rate and Rhythm: Normal rate and regular rhythm.     Pulses: Normal pulses.     Heart sounds: S1 normal and S2 normal.  Pulmonary:     Effort: Pulmonary effort is normal.     Breath sounds: Normal breath sounds and air entry.  Abdominal:     General: Abdomen is flat. Bowel sounds are normal.     Palpations: Abdomen is soft.  Musculoskeletal:     Right lower leg: No edema.     Left lower leg: No edema.  Lymphadenopathy:     Cervical: No cervical adenopathy.  Neurological:     Mental Status: She is alert and oriented to person, place, and time. Mental status is at baseline.     Gait: Gait is intact.  Psychiatric:        Mood and Affect: Mood and affect normal.        Speech: Speech normal.        Behavior: Behavior normal.        Judgment: Judgment normal.      No results found for any visits on 08/11/23.     Assessment & Plan:    Routine Health Maintenance and Physical Exam  Immunization History  Administered Date(s) Administered   Influenza Whole 10/26/2010   Influenza-Unspecified 09/27/2016, 10/20/2017   Tdap 05/30/2014   Zoster Recombinant(Shingrix) 03/15/2022, 10/11/2022    Health Maintenance  Topic Date Due   INFLUENZA VACCINE  07/21/2023   COVID-19 Vaccine (1) 04/11/2024 (Originally 08/25/1971)   MAMMOGRAM  05/03/2024   DTaP/Tdap/Td (2 - Td or Tdap) 05/30/2024   Colonoscopy  03/08/2027   Hepatitis C Screening  Completed   HIV Screening  Completed   Zoster Vaccines- Shingrix  Completed   HPV VACCINES  Aged Out   PAP SMEAR-Modifier  Discontinued    Discussed health benefits of physical activity, and encouraged her to engage in regular exercise appropriate for her age and  condition.  Routine general medical examination at a health care facility -     CBC with Differential/Platelet -     Comprehensive metabolic panel  Obesity (BMI 30.0-34.9) -     Phentermine HCl; Take 1 capsule (30 mg total) by mouth every morning.  Dispense: 30 capsule; Refill: 0 -     Phentermine HCl; Take 1 capsule (  30 mg total) by mouth every morning.  Dispense: 30 capsule; Refill: 0 -     Phentermine HCl; Take 1 capsule (30 mg total) by mouth every morning.  Dispense: 30 capsule; Refill: 0 -     Topiramate; Take 1 tablet (50 mg total) by mouth at bedtime.  Dispense: 90 tablet; Refill: 1  Normal physical exam findings today, getting CMP and CBC for annual surveillance. Counseled patient on carb and calorie reduction and to increase protein and fiber in the diet. Handouts given on healthy eating and exercise.   Return in about 3 months (around 11/11/2023) for weight loss-- ok to be 15 minute visit.     Karie Georges, MD

## 2023-08-11 NOTE — Patient Instructions (Addendum)
Total protein per day: 90-95 grams  Fiber: at least 30 grams  Health Maintenance, Female Adopting a healthy lifestyle and getting preventive care are important in promoting health and wellness. Ask your health care provider about: The right schedule for you to have regular tests and exams. Things you can do on your own to prevent diseases and keep yourself healthy. What should I know about diet, weight, and exercise? Eat a healthy diet  Eat a diet that includes plenty of vegetables, fruits, low-fat dairy products, and lean protein. Do not eat a lot of foods that are high in solid fats, added sugars, or sodium. Maintain a healthy weight Body mass index (BMI) is used to identify weight problems. It estimates body fat based on height and weight. Your health care provider can help determine your BMI and help you achieve or maintain a healthy weight. Get regular exercise Get regular exercise. This is one of the most important things you can do for your health. Most adults should: Exercise for at least 150 minutes each week. The exercise should increase your heart rate and make you sweat (moderate-intensity exercise). Do strengthening exercises at least twice a week. This is in addition to the moderate-intensity exercise. Spend less time sitting. Even light physical activity can be beneficial. Watch cholesterol and blood lipids Have your blood tested for lipids and cholesterol at 59 years of age, then have this test every 5 years. Have your cholesterol levels checked more often if: Your lipid or cholesterol levels are high. You are older than 59 years of age. You are at high risk for heart disease. What should I know about cancer screening? Depending on your health history and family history, you may need to have cancer screening at various ages. This may include screening for: Breast cancer. Cervical cancer. Colorectal cancer. Skin cancer. Lung cancer. What should I know about heart  disease, diabetes, and high blood pressure? Blood pressure and heart disease High blood pressure causes heart disease and increases the risk of stroke. This is more likely to develop in people who have high blood pressure readings or are overweight. Have your blood pressure checked: Every 3-5 years if you are 37-75 years of age. Every year if you are 16 years old or older. Diabetes Have regular diabetes screenings. This checks your fasting blood sugar level. Have the screening done: Once every three years after age 46 if you are at a normal weight and have a low risk for diabetes. More often and at a younger age if you are overweight or have a high risk for diabetes. What should I know about preventing infection? Hepatitis B If you have a higher risk for hepatitis B, you should be screened for this virus. Talk with your health care provider to find out if you are at risk for hepatitis B infection. Hepatitis C Testing is recommended for: Everyone born from [birth year] through 1967. Anyone with known risk factors for hepatitis C. Sexually transmitted infections (STIs) Get screened for STIs, including gonorrhea and chlamydia, if: You are sexually active and are younger than 59 years of age. You are older than 59 years of age and your health care provider tells you that you are at risk for this type of infection. Your sexual activity has changed since you were last screened, and you are at increased risk for chlamydia or gonorrhea. Ask your health care provider if you are at risk. Ask your health care provider about whether you are at high risk for HIV.  Your health care provider may recommend a prescription medicine to help prevent HIV infection. If you choose to take medicine to prevent HIV, you should first get tested for HIV. You should then be tested every 3 months for as long as you are taking the medicine. Pregnancy If you are about to stop having your period (premenopausal) and you may become  pregnant, seek counseling before you get pregnant. Take 400 to 800 micrograms (mcg) of folic acid every day if you become pregnant. Ask for birth control (contraception) if you want to prevent pregnancy. Osteoporosis and menopause Osteoporosis is a disease in which the bones lose minerals and strength with aging. This can result in bone fractures. If you are 8 years old or older, or if you are at risk for osteoporosis and fractures, ask your health care provider if you should: Be screened for bone loss. Take a calcium or vitamin D supplement to lower your risk of fractures. Be given hormone replacement therapy (HRT) to treat symptoms of menopause. Follow these instructions at home: Alcohol use Do not drink alcohol if: Your health care provider tells you not to drink. You are pregnant, may be pregnant, or are planning to become pregnant. If you drink alcohol: Limit how much you have to: 0-1 drink a day. Know how much alcohol is in your drink. In the [place] one drink equals one 12 oz bottle of beer (355 mL), one 5 oz glass of wine (148 mL), or one 1 oz glass of hard liquor (44 mL). Lifestyle Do not use any products that contain nicotine or tobacco. These products include cigarettes, chewing tobacco, and vaping devices, such as e-cigarettes. If you need help quitting, ask your health care provider. Do not use street drugs. Do not share needles. Ask your health care provider for help if you need support or information about quitting drugs. General instructions Schedule regular health, dental, and eye exams. Stay current with your vaccines. Tell your health care provider if: You often feel depressed. You have ever been abused or do not feel safe at home. Summary Adopting a healthy lifestyle and getting preventive care are important in promoting health and wellness. Follow your health care provider's instructions about healthy diet, exercising, and getting tested or screened for  diseases. Follow your health care provider's instructions on monitoring your cholesterol and blood pressure. This information is not intended to replace advice given to you by your health care provider. Make sure you discuss any questions you have with your health care provider. Document Revised: 04/27/2021 Document Reviewed: 04/27/2021 Elsevier Patient Education  2024 [employer].

## 2023-08-12 ENCOUNTER — Other Ambulatory Visit (HOSPITAL_COMMUNITY): Payer: Self-pay

## 2023-08-12 ENCOUNTER — Telehealth: Payer: Self-pay | Admitting: Pharmacy Technician

## 2023-08-12 NOTE — Telephone Encounter (Signed)
Pharmacy Patient Advocate Encounter   Received notification from CoverMyMeds that prior authorization for Phentermine HCl 30MG  capsules is required/requested.   Insurance verification completed.   The patient is insured through [facility] .   Per test claim: PA required; PA submitted to [facility] via CoverMyMeds Key/confirmation #/EOC [id number] Status is pending

## 2023-08-15 NOTE — Telephone Encounter (Signed)
Pharmacy Patient Advocate Encounter  Received notification from [facility] that Prior Authorization for Phentermine has been DENIED. Please advise how you'd like to proceed. Full denial letter will be uploaded to the media tab. See denial reason below.    PA #/Case ID/Reference #: [id number]

## 2023-08-15 NOTE — Telephone Encounter (Signed)
It's ok she can pay for it out of pocket-- please let pt know that she may do this.

## 2023-08-15 NOTE — Telephone Encounter (Signed)
Patient informed of the message below.  Stated she already picked up the Rx and paid cash.

## 2023-09-19 ENCOUNTER — Other Ambulatory Visit: Payer: Self-pay | Admitting: Family Medicine

## 2023-09-27 ENCOUNTER — Telehealth: Payer: Self-pay | Admitting: *Deleted

## 2023-09-27 MED ORDER — AMLODIPINE BESYLATE 2.5 MG PO TABS: 2.5000 mg | ORAL_TABLET | Freq: Every day | ORAL | 0 refills | Status: DC

## 2023-09-27 NOTE — Telephone Encounter (Signed)
Rx done. 

## 2023-10-03 ENCOUNTER — Other Ambulatory Visit: Payer: Self-pay | Admitting: Family Medicine

## 2023-10-03 DIAGNOSIS — E66811 Obesity, class 1: Secondary | ICD-10-CM

## 2023-10-04 MED ORDER — PHENTERMINE HCL 30 MG PO CAPS
30.0000 mg | ORAL_CAPSULE | ORAL | 0 refills | Status: DC
Start: 2023-10-04 — End: 2023-11-11

## 2023-11-11 ENCOUNTER — Telehealth: Payer: Self-pay | Admitting: Family Medicine

## 2023-11-11 ENCOUNTER — Encounter: Payer: Self-pay | Admitting: Family Medicine

## 2023-11-11 ENCOUNTER — Ambulatory Visit: Payer: No Typology Code available for payment source | Admitting: Family Medicine

## 2023-11-11 DIAGNOSIS — E66811 Obesity, class 1: Secondary | ICD-10-CM

## 2023-11-11 DIAGNOSIS — Z6829 Body mass index (BMI) 29.0-29.9, adult: Secondary | ICD-10-CM

## 2023-11-11 MED ORDER — PHENTERMINE HCL 30 MG PO CAPS
30.0000 mg | ORAL_CAPSULE | ORAL | 0 refills | Status: DC
Start: 2024-01-05 — End: 2024-01-03

## 2023-11-11 MED ORDER — ZEPBOUND 2.5 MG/0.5ML ~~LOC~~ SOAJ
2.5000 mg | SUBCUTANEOUS | 0 refills | Status: DC
Start: 2023-11-11 — End: 2023-12-08

## 2023-11-11 MED ORDER — PHENTERMINE HCL 30 MG PO CAPS
30.0000 mg | ORAL_CAPSULE | ORAL | 0 refills | Status: DC
Start: 2023-11-11 — End: 2024-01-03

## 2023-11-11 MED ORDER — PHENTERMINE HCL 30 MG PO CAPS
30.0000 mg | ORAL_CAPSULE | ORAL | 0 refills | Status: DC
Start: 2023-12-08 — End: 2024-01-03

## 2023-11-11 NOTE — Telephone Encounter (Signed)
I called [facility]

## 2023-11-11 NOTE — Telephone Encounter (Signed)
[facility] call and and need a call back to tell them the pt BMI because they will full the RX for Phentermine.

## 2023-11-11 NOTE — Progress Notes (Signed)
Established Patient Office Visit  Subjective   Patient ID: Dawn Cole, female    DOB: 1966-04-01  Age: 57 y.o. MRN: [id number]  Chief Complaint  Patient presents with   Medical Management of Chronic Issues    Pt is here for 3 month follow up/ med refills today. She reports she hasn't lost any weight since the last visit, still exercising and weight lifting, although she hasn't been able to monitor her diet and much as before. States that she feels the medication is working for her. We reviewed her dietary goals and discussed ways she could try to break her plateau.     Current Outpatient Medications  Medication Instructions   amLODipine (NORVASC) 2.5 mg, Oral, Daily   Biotin w/ Vitamins C & E (HAIR SKIN & NAILS GUMMIES PO) 2 tablets, Oral, Daily, 600 mcg per serving   estradiol (VIVELLE-DOT) 0.075 MG/24HR 0.075 patches, Transdermal, 2 times weekly,     fluticasone (FLONASE ALLERGY RELIEF) 50 MCG/ACT nasal spray 1 spray, Each Nare, Daily   Multiple Vitamins-Minerals (MULTIVITAMIN WITH MINERALS) tablet 1 tablet, Oral, Daily   phentermine 30 mg, Oral, BH-each morning   [START ON 12/08/2023] phentermine 30 mg, Oral, BH-each morning   [START ON 01/05/2024] phentermine 30 mg, Oral, BH-each morning   Probiotic Product (PROBIOTIC PO) 3 tablets, Oral, Daily, [place] multi enzyme   SALINE NA Nasal, 2 times daily   sodium chloride (OCEAN) 0.65 % SOLN nasal spray 1 spray, Each Nare, At bedtime PRN, Simply saline   tolterodine (DETROL LA) 2 mg, Oral, Every morning   topiramate (TOPAMAX) 50 mg, Oral, Daily at bedtime   TURMERIC-GINGER PO 2 tablets, Oral, Daily, 500 mg/50 mg   Zepbound 2.5 mg, Subcutaneous, Weekly    Patient Active Problem List   Diagnosis Date Noted   Obesity (BMI 30.0-34.9) 10/11/2022   Chronic pansinusitis 08/04/2022   Menopause present 01/31/2018   Urinary incontinence 01/31/2018   Genetic testing 09/27/2017   Family history of brain cancer    Family history  of pancreatic cancer    Family history of colon cancer    Family history of ovarian cancer    Family history of melanoma    Malignant melanoma (HCC)    Family history of cancer 05/29/2013   Essential hypertension 01/09/2009   Dyslipidemia 10/09/2007      Review of Systems  All other systems reviewed and are negative.     Objective:     BP 120/82 (BP Location: Left Arm, Patient Position: Sitting, Cuff Size: Normal)   Pulse 72   Temp 97.6 F (36.4 C) (Oral)   Ht 5' 3.5" (1.613 m)   Wt 169 lb 14.4 oz (77.1 kg)   SpO2 99%   BMI 29.62 kg/m    Physical Exam Vitals reviewed.  Constitutional:      Appearance: Normal appearance. She is well-groomed and normal weight.  Eyes:     Conjunctiva/sclera: Conjunctivae normal.  Neck:     Thyroid: No thyromegaly.  Cardiovascular:     Rate and Rhythm: Normal rate and regular rhythm.     Pulses: Normal pulses.     Heart sounds: S1 normal and S2 normal.  Pulmonary:     Effort: Pulmonary effort is normal.     Breath sounds: Normal breath sounds and air entry.  Abdominal:     General: Bowel sounds are normal.  Musculoskeletal:     Right lower leg: No edema.     Left lower leg: No edema.  Neurological:     Mental Status: She is alert and oriented to person, place, and time. Mental status is at baseline.     Gait: Gait is intact.  Psychiatric:        Mood and Affect: Mood and affect normal.        Speech: Speech normal.        Behavior: Behavior normal.        Judgment: Judgment normal.      No results found for any visits on 11/11/23.    The 10-year ASCVD risk score (Arnett DK, et al., 2019) is: 3.8%    Assessment & Plan:  Obesity (BMI 30.0-34.9) Assessment & Plan: The increase to 30 mg daily and topiramate 50 mg daily did not results in weight loss this visit. We will continue the medication for now, will check with her insurance again to see if the zepbound is covered. Patient's co morbid condition is HLD and she has  essentially failed diet and exercise and phentermine/topriramate combination. I also gave her the websites to look at to see if she could get GLP medication online.   Orders: -     Phentermine HCl; Take 1 capsule (30 mg total) by mouth every morning.  Dispense: 30 capsule; Refill: 0 -     Phentermine HCl; Take 1 capsule (30 mg total) by mouth every morning.  Dispense: 30 capsule; Refill: 0 -     Phentermine HCl; Take 1 capsule (30 mg total) by mouth every morning.  Dispense: 30 capsule; Refill: 0 -     Zepbound; Inject 2.5 mg into the skin once a week.  Dispense: 2 mL; Refill: 0     Return in about 3 months (around 02/11/2024) for weight loss.    Karie Georges, MD

## 2023-11-11 NOTE — Assessment & Plan Note (Signed)
The increase to 30 mg daily and topiramate 50 mg daily did not results in weight loss this visit. We will continue the medication for now, will check with her insurance again to see if the zepbound is covered. Patient's co morbid condition is HLD and she has essentially failed diet and exercise and phentermine/topriramate combination. I also gave her the websites to look at to see if she could get GLP medication online.

## 2023-11-11 NOTE — Patient Instructions (Signed)
[email or web address]  [email or web address]  [facility]  [facility]

## 2023-11-11 NOTE — Telephone Encounter (Signed)
I called [facility] and spoke with Inetta Fermo or Zella Ball and she stated the patient's BMI was already calculated based on her weight and height.

## 2023-11-14 ENCOUNTER — Telehealth: Payer: Self-pay | Admitting: Pharmacist

## 2023-11-14 ENCOUNTER — Other Ambulatory Visit (HOSPITAL_COMMUNITY): Payer: Self-pay

## 2023-11-14 NOTE — Telephone Encounter (Signed)
Pharmacy Patient Advocate Encounter   Received notification from Physician's Office that prior authorization for Zepbound 2.5MG /0.5ML pen-injectors is required/requested.   Insurance verification completed.   The patient is insured through [facility] .   Per test claim: PA required; PA submitted to above mentioned insurance via CoverMyMeds Key/confirmation #/EOC [id number] Status is pending

## 2023-11-14 NOTE — Telephone Encounter (Signed)
Pharmacy Patient Advocate Encounter  Received notification from [facility] that Prior Authorization for Zepbound 2.5MG /0.5ML pen-injectors has been APPROVED from 11/14/2023 to 07/12/2024. Ran test claim, Copay is $30.00 for a one month supply. This test claim was processed through [facility]- copay amounts may vary at other pharmacies due to pharmacy/plan contracts, or as the patient moves through the different stages of their insurance plan.   PA #/Case ID/Reference #: [id number]

## 2023-11-15 NOTE — Telephone Encounter (Signed)
Spoke with Chudni at [facility] and informed her of the approval as below.

## 2023-12-08 ENCOUNTER — Other Ambulatory Visit: Payer: Self-pay | Admitting: Family Medicine

## 2023-12-08 ENCOUNTER — Encounter: Payer: Self-pay | Admitting: Family Medicine

## 2023-12-08 DIAGNOSIS — E66811 Obesity, class 1: Secondary | ICD-10-CM

## 2023-12-08 MED ORDER — TIRZEPATIDE-WEIGHT MANAGEMENT 5 MG/0.5ML ~~LOC~~ SOLN
5.0000 mg | SUBCUTANEOUS | 0 refills | Status: DC
Start: 2023-12-08 — End: 2024-01-03

## 2023-12-12 ENCOUNTER — Other Ambulatory Visit (HOSPITAL_COMMUNITY): Payer: Self-pay

## 2023-12-28 ENCOUNTER — Other Ambulatory Visit: Payer: Self-pay | Admitting: Family Medicine

## 2024-01-02 ENCOUNTER — Other Ambulatory Visit: Payer: Self-pay | Admitting: Family Medicine

## 2024-01-02 DIAGNOSIS — E66811 Obesity, class 1: Secondary | ICD-10-CM

## 2024-01-03 ENCOUNTER — Other Ambulatory Visit: Payer: Self-pay | Admitting: Family Medicine

## 2024-01-03 MED ORDER — ZEPBOUND 7.5 MG/0.5ML ~~LOC~~ SOAJ
7.5000 mg | SUBCUTANEOUS | 0 refills | Status: DC
Start: 2024-01-03 — End: 2024-01-30

## 2024-01-30 ENCOUNTER — Other Ambulatory Visit: Payer: Self-pay | Admitting: Family Medicine

## 2024-01-30 DIAGNOSIS — E66811 Obesity, class 1: Secondary | ICD-10-CM

## 2024-02-06 ENCOUNTER — Ambulatory Visit: Payer: No Typology Code available for payment source | Admitting: Family Medicine

## 2024-02-08 ENCOUNTER — Telehealth (INDEPENDENT_AMBULATORY_CARE_PROVIDER_SITE_OTHER): Payer: No Typology Code available for payment source | Admitting: Family Medicine

## 2024-02-08 DIAGNOSIS — E66811 Obesity, class 1: Secondary | ICD-10-CM | POA: Diagnosis not present

## 2024-02-08 NOTE — Assessment & Plan Note (Signed)
Pt is doing well on the zepbound 7.5 mg, will be increasing to 10 mg this month. Weight at home is 152-153, 16 pound weight loss in the first 3 months. Her goal weight is around 140. Pt will let me know how the 10 mg is working before we decide to increase to the 12.5 mg. RTC in 3 months in the office for weight check and BP check. I spent 20 minutes with the patient today on this video visit discussing her response to the medication and counseling on whether or not to increase to the 12.5 mg.

## 2024-02-08 NOTE — Progress Notes (Signed)
   Virtual Medical Office Visit  Patient:  Dawn Cole      Age: 58 y.o.       Sex:  female  Date:   02/08/2024  PCP:    Karie Georges, MD   Today's Healthcare Provider: Karie Georges, MD    Assessment/Plan:   Summary assessment:  Obesity (BMI 30.0-34.9) Assessment & Plan: Pt is doing well on the zepbound 7.5 mg, will be increasing to 10 mg this month. Weight at home is 152-153, 16 pound weight loss in the first 3 months. Her goal weight is around 140. Pt will let me know how the 10 mg is working before we decide to increase to the 12.5 mg. RTC in 3 months in the office for weight check and BP check. I spent 20 minutes with the patient today on this video visit discussing her response to the medication and counseling on whether or not to increase to the 12.5 mg.       Return in about 3 months (around 05/07/2024) for weight loss.   She was advised to call the office or go to ER if her condition worsens    Subjective:   Dawn Cole is a 58 y.o. 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    She clarifies and reports that her condition: Weight loss -- pt has been on the injections since December 2, is having some nausea initially but this improves over time. Today she weighs 152-153, BMI is 27.2. is currently on the 7.5 mg, has picked up the 10 mg dose and will start this in a couple of weeks.  BP was measured this morning at home 122/73  She denies having any: Constipation or diarrhea, no hair loss or low energy          Objective/Observations  Physical Exam:  Polite  and friendly Gen: NAD, resting comfortably Pulm: Normal work of breathing Neuro: Grossly normal, moves all extremities Psych: Normal affect and thought content Problem specific physical exam findings:    No images are attached to the encounter or orders placed in the encounter.    Results: No results found for any visits on 02/08/24.   No results found for this or any previous visit (from the past 2160 hours).         Virtual Visit via Video   I connected with [name] on 02/08/24 at  8:30 AM EST by a video enabled telemedicine application and verified that I am speaking with the correct person using two identifiers. The limitations of evaluation and management by telemedicine and the availability of in person appointments were discussed. The patient expressed understanding and agreed to proceed.   Percentage of appointment time on video:  100% Patient location: Home Provider location: home location Persons participating in the virtual visit: Myself and Patient

## 2024-02-27 ENCOUNTER — Other Ambulatory Visit: Payer: Self-pay | Admitting: Family Medicine

## 2024-02-27 DIAGNOSIS — E66811 Obesity, class 1: Secondary | ICD-10-CM

## 2024-02-27 NOTE — Telephone Encounter (Signed)
 Please ask pt is she wants to increase to 12.5 mg for this month or stay at the 10 mg dosing.

## 2024-02-27 NOTE — Telephone Encounter (Signed)
 Patient informed of the message below and stated she would prefer to stay on the 10mg  dose at this time.  Message sent to PCP.

## 2024-03-11 ENCOUNTER — Encounter: Payer: Self-pay | Admitting: Family Medicine

## 2024-04-03 ENCOUNTER — Other Ambulatory Visit (HOSPITAL_COMMUNITY): Payer: Self-pay

## 2024-04-03 ENCOUNTER — Telehealth: Payer: Self-pay

## 2024-04-03 NOTE — Telephone Encounter (Signed)
 I received a request to do a prior auth for Zepbound 10mg /0.40ml auto injector. Uponn doing a test claim the insurance rejected says Dawn Cole has to enroll in [facility] (see screen shot below)

## 2024-04-09 NOTE — Telephone Encounter (Signed)
 Please let patient know her insurance is requiring this enrollment

## 2024-04-09 NOTE — Telephone Encounter (Signed)
 Spoke with the patient, informed her of the message below and offered the number to contact [facility].  Patient stated she was given this information by the pharmacy when she went to pick up the Rx.

## 2024-04-13 ENCOUNTER — Other Ambulatory Visit: Payer: Self-pay | Admitting: Family Medicine

## 2024-04-13 DIAGNOSIS — Z0000 Encounter for general adult medical examination without abnormal findings: Secondary | ICD-10-CM

## 2024-04-16 ENCOUNTER — Encounter: Payer: Self-pay | Admitting: Family Medicine

## 2024-04-27 ENCOUNTER — Encounter (HOSPITAL_COMMUNITY): Payer: Self-pay

## 2024-05-04 ENCOUNTER — Ambulatory Visit
Admission: RE | Admit: 2024-05-04 | Discharge: 2024-05-04 | Disposition: A | Discharge status: Home / Self Care | Source: Ambulatory Visit | Attending: Family Medicine | Admitting: Family Medicine

## 2024-05-04 DIAGNOSIS — Z0000 Encounter for general adult medical examination without abnormal findings: Secondary | ICD-10-CM

## 2024-05-10 ENCOUNTER — Ambulatory Visit: Payer: Self-pay | Admitting: Family Medicine

## 2024-06-25 ENCOUNTER — Other Ambulatory Visit: Payer: Self-pay | Admitting: Family Medicine

## 2024-07-16 ENCOUNTER — Other Ambulatory Visit (HOSPITAL_COMMUNITY): Payer: Self-pay

## 2024-08-27 ENCOUNTER — Ambulatory Visit: Admitting: Family Medicine

## 2024-08-27 ENCOUNTER — Encounter: Payer: Self-pay | Admitting: Family Medicine

## 2024-08-27 VITALS — BP 100/70 | HR 62 | Temp 98.1°F | Ht 63.0 in | Wt 140.9 lb

## 2024-08-27 DIAGNOSIS — E785 Hyperlipidemia, unspecified: Secondary | ICD-10-CM

## 2024-08-27 DIAGNOSIS — Z0000 Encounter for general adult medical examination without abnormal findings: Secondary | ICD-10-CM

## 2024-08-27 DIAGNOSIS — E66811 Obesity, class 1: Secondary | ICD-10-CM

## 2024-08-27 DIAGNOSIS — Z23 Encounter for immunization: Secondary | ICD-10-CM | POA: Diagnosis not present

## 2024-08-27 MED ORDER — WEGOVY 2.4 MG/0.75ML ~~LOC~~ SOAJ: 2.4000 mg | SUBCUTANEOUS | Status: AC

## 2024-08-27 NOTE — Progress Notes (Signed)
 Complete physical exam  Patient: Dawn Cole   DOB: 03/23/1966   58 y.o. Female  MRN: [id number]  Subjective:    Chief Complaint  Patient presents with   Annual Exam    ANNMARGARET DECAPRIO is a 58 y.o. female who presents today for a complete physical exam. She reports consuming a general diet. Is trying to get enough protein, fruits and veggies, eats some dairy but not a lot, eats cheese and yogurt. Stays hydrated, very little fast food and processed foods. Does beach body workouts five days a week. She generally feels well. She reports sleeping fairly well. Gets woken up by her husband snoring and her dogs sometimes will wake her at night. She does not have additional problems to discuss today.    Most recent fall risk assessment:     No data to display           Most recent depression screenings:    11/11/2023    9:30 AM 08/11/2023    8:41 AM  PHQ 2/9 Scores  PHQ - 2 Score 0 0  PHQ- 9 Score 1 2    Vision:Within last year and maybe a small cataract forming and Dental: No current dental problems and Receives regular dental care  Patient Active Problem List   Diagnosis Date Noted   Obesity (BMI 30.0-34.9) 10/11/2022   Chronic pansinusitis 08/04/2022   Menopause present 01/31/2018   Urinary incontinence 01/31/2018   Genetic testing 09/27/2017   Family history of brain cancer    Family history of pancreatic cancer    Family history of colon cancer    Family history of ovarian cancer    Family history of melanoma    Malignant melanoma (HCC)    Family history of cancer 05/29/2013   Essential hypertension 01/09/2009   Dyslipidemia 10/09/2007      Patient Care Team: Ozell Heron HERO, MD as PCP - General (Family Medicine) Lynnell Nottingham, MD as Consulting Physician (Dermatology) Key, Hargis HERO, NP as Nurse Practitioner (Gynecology)   Outpatient Medications Prior to Visit  Medication Sig   amLODipine  (NORVASC ) 2.5 MG tablet Take 1 tablet by mouth once daily    Biotin w/ Vitamins C & E (HAIR SKIN & NAILS GUMMIES PO) Take 2 tablets by mouth daily. 600 mcg per serving   estradiol  (VIVELLE -DOT) 0.075 MG/24HR Place 0.075 patches onto the skin 2 (two) times a week.   fluticasone (FLONASE ALLERGY RELIEF) 50 MCG/ACT nasal spray Place 1 spray into both nostrils daily.   Multiple Vitamins-Minerals (MULTIVITAMIN WITH MINERALS) tablet Take 1 tablet by mouth daily.   Probiotic Product (PROBIOTIC PO) Take 3 tablets by mouth daily. [place] multi enzyme   SALINE NA Place into the nose in the morning and at bedtime.   sodium chloride  (OCEAN) 0.65 % SOLN nasal spray Place 1 spray into both nostrils at bedtime as needed for congestion. Simply saline   tolterodine (DETROL LA) 2 MG 24 hr capsule Take 2 mg by mouth every morning.   TURMERIC-GINGER PO Take 2 tablets by mouth daily. 500 mg/50 mg   [DISCONTINUED] ZEPBOUND  10 MG/0.5ML Pen INJECT 0.5 ML (10 MG TOTAL)  SUBCUTANEOUSLY ONCE A WEEK   No facility-administered medications prior to visit.    Review of Systems  HENT:  Negative for hearing loss.   Eyes:  Negative for blurred vision.  Respiratory:  Negative for shortness of breath.   Cardiovascular:  Negative for chest pain.  Gastrointestinal: Negative.   Genitourinary: Negative.  Musculoskeletal:  Negative for back pain.  Neurological:  Negative for headaches.  Psychiatric/Behavioral:  Negative for depression.        Objective:     BP 100/70   Pulse 62   Temp 98.1 F (36.7 C) (Oral)   Ht 5' 3 (1.6 m)   Wt 140 lb 14.4 oz (63.9 kg)   SpO2 100%   BMI 24.96 kg/m    Physical Exam Vitals reviewed.  Constitutional:      Appearance: Normal appearance. She is well-groomed and normal weight.  HENT:     Right Ear: Tympanic membrane and ear canal normal.     Left Ear: Tympanic membrane and ear canal normal.     Mouth/Throat:     Mouth: Mucous membranes are moist.     Pharynx: No posterior oropharyngeal erythema.  Eyes:     Conjunctiva/sclera:  Conjunctivae normal.  Neck:     Thyroid : No thyromegaly.  Cardiovascular:     Rate and Rhythm: Normal rate and regular rhythm.     Pulses: Normal pulses.     Heart sounds: S1 normal and S2 normal.  Pulmonary:     Effort: Pulmonary effort is normal.     Breath sounds: Normal breath sounds and air entry.  Abdominal:     General: Abdomen is flat. Bowel sounds are normal.     Palpations: Abdomen is soft.  Musculoskeletal:     Right lower leg: No edema.     Left lower leg: No edema.  Lymphadenopathy:     Cervical: No cervical adenopathy.  Neurological:     Mental Status: She is alert and oriented to person, place, and time. Mental status is at baseline.     Gait: Gait is intact.  Psychiatric:        Mood and Affect: Mood and affect normal.        Speech: Speech normal.        Behavior: Behavior normal.        Judgment: Judgment normal.      No results found for any visits on 08/27/24.     Assessment & Plan:    Routine Health Maintenance and Physical Exam  Immunization History  Administered Date(s) Administered   Influenza Whole 10/26/2010   Influenza-Unspecified 09/27/2016, 10/20/2017, 11/07/2018   Smallpox 01/17/1970   Tdap 05/30/2014   Zoster Recombinant(Shingrix ) 03/15/2022, 10/11/2022    Health Maintenance  Topic Date Due   COVID-19 Vaccine (1) Never done   Pneumococcal Vaccine: 50+ Years (1 of 1 - PCV) Never done   DTaP/Tdap/Td (2 - Td or Tdap) 05/30/2024   Influenza Vaccine  03/19/2025 (Originally 07/20/2024)   MAMMOGRAM  05/04/2025   Colonoscopy  04/20/2032   Hepatitis C Screening  Completed   HIV Screening  Completed   Zoster Vaccines- Shingrix   Completed   Hepatitis B Vaccines 19-59 Average Risk  Aged Out   HPV VACCINES  Aged Out   Meningococcal B Vaccine  Aged Out    Discussed health benefits of physical activity, and encouraged her to engage in regular exercise appropriate for her age and condition.  Obesity (BMI 30.0-34.9) -     Comprehensive  metabolic panel with GFR; Future -     CBC with Differential/Platelet; Future -     Wegovy ; Inject 2.4 mg into the skin once a week.  Dyslipidemia -     Lipid panel; Future  Routine general medical examination at a health care facility  Immunization due -     Tdap vaccine  greater than or equal to 7yo IM  General physical exam findings are normal today. I reviewed the patient's preventative testing, immunizations, and lifestyle habits. I made appropriate recommendations and placed orders for the appropriate tests and/or vaccinations. I counseled the patient on the CDC's recommendations for healthy exercise and diet. I counseled the patient on healthy sleep habits and stress management. Handouts to reinforce the counseling were given at the conclusion of the visit.    Return in 1 year (on 08/27/2025).     Heron CHRISTELLA Sharper, MD

## 2024-08-27 NOTE — Patient Instructions (Addendum)
 Ok to try stopping the amlodipine  -- check BP daily for 2 weeks -- anything less than 140/90 is normal.  Health Maintenance, Female Adopting a healthy lifestyle and getting preventive care are important in promoting health and wellness. Ask your health care provider about: The right schedule for you to have regular tests and exams. Things you can do on your own to prevent diseases and keep yourself healthy. What should I know about diet, weight, and exercise? Eat a healthy diet  Eat a diet that includes plenty of vegetables, fruits, low-fat dairy products, and lean protein. Do not eat a lot of foods that are high in solid fats, added sugars, or sodium. Maintain a healthy weight Body mass index (BMI) is used to identify weight problems. It estimates body fat based on height and weight. Your health care provider can help determine your BMI and help you achieve or maintain a healthy weight. Get regular exercise Get regular exercise. This is one of the most important things you can do for your health. Most adults should: Exercise for at least 150 minutes each week. The exercise should increase your heart rate and make you sweat (moderate-intensity exercise). Do strengthening exercises at least twice a week. This is in addition to the moderate-intensity exercise. Spend less time sitting. Even light physical activity can be beneficial. Watch cholesterol and blood lipids Have your blood tested for lipids and cholesterol at 60 years of age, then have this test every 5 years. Have your cholesterol levels checked more often if: Your lipid or cholesterol levels are high. You are older than 60 years of age. You are at high risk for heart disease. What should I know about cancer screening? Depending on your health history and family history, you may need to have cancer screening at various ages. This may include screening for: Breast cancer. Cervical cancer. Colorectal cancer. Skin cancer. Lung  cancer. What should I know about heart disease, diabetes, and high blood pressure? Blood pressure and heart disease High blood pressure causes heart disease and increases the risk of stroke. This is more likely to develop in people who have high blood pressure readings or are overweight. Have your blood pressure checked: Every 3-5 years if you are 8-27 years of age. Every year if you are 3 years old or older. Diabetes Have regular diabetes screenings. This checks your fasting blood sugar level. Have the screening done: Once every three years after age 43 if you are at a normal weight and have a low risk for diabetes. More often and at a younger age if you are overweight or have a high risk for diabetes. What should I know about preventing infection? Hepatitis B If you have a higher risk for hepatitis B, you should be screened for this virus. Talk with your health care provider to find out if you are at risk for hepatitis B infection. Hepatitis C Testing is recommended for: Everyone born from [birth year] through 1966/10/10. Anyone with known risk factors for hepatitis C. Sexually transmitted infections (STIs) Get screened for STIs, including gonorrhea and chlamydia, if: You are sexually active and are younger than 60 years of age. You are older than 60 years of age and your health care provider tells you that you are at risk for this type of infection. Your sexual activity has changed since you were last screened, and you are at increased risk for chlamydia or gonorrhea. Ask your health care provider if you are at risk. Ask your health care provider about  whether you are at high risk for HIV. Your health care provider may recommend a prescription medicine to help prevent HIV infection. If you choose to take medicine to prevent HIV, you should first get tested for HIV. You should then be tested every 3 months for as long as you are taking the medicine. Pregnancy If you are about to stop having your  period (premenopausal) and you may become pregnant, seek counseling before you get pregnant. Take 400 to 800 micrograms (mcg) of folic acid every day if you become pregnant. Ask for birth control (contraception) if you want to prevent pregnancy. Osteoporosis and menopause Osteoporosis is a disease in which the bones lose minerals and strength with aging. This can result in bone fractures. If you are 63 years old or older, or if you are at risk for osteoporosis and fractures, ask your health care provider if you should: Be screened for bone loss. Take a calcium or vitamin D supplement to lower your risk of fractures. Be given hormone replacement therapy (HRT) to treat symptoms of menopause. Follow these instructions at home: Alcohol use Do not drink alcohol if: Your health care provider tells you not to drink. You are pregnant, may be pregnant, or are planning to become pregnant. If you drink alcohol: Limit how much you have to: 0-1 drink a day. Know how much alcohol is in your drink. In the [place] one drink equals one 12 oz bottle of beer (355 mL), one 5 oz glass of wine (148 mL), or one 1 oz glass of hard liquor (44 mL). Lifestyle Do not use any products that contain nicotine or tobacco. These products include cigarettes, chewing tobacco, and vaping devices, such as e-cigarettes. If you need help quitting, ask your health care provider. Do not use street drugs. Do not share needles. Ask your health care provider for help if you need support or information about quitting drugs. General instructions Schedule regular health, dental, and eye exams. Stay current with your vaccines. Tell your health care provider if: You often feel depressed. You have ever been abused or do not feel safe at home. Summary Adopting a healthy lifestyle and getting preventive care are important in promoting health and wellness. Follow your health care provider's instructions about healthy diet, exercising, and  getting tested or screened for diseases. Follow your health care provider's instructions on monitoring your cholesterol and blood pressure. This information is not intended to replace advice given to you by your health care provider. Make sure you discuss any questions you have with your health care provider. Document Revised: 04/27/2021 Document Reviewed: 04/27/2021 Elsevier Patient Education  2024 [employer].

## 2024-08-30 ENCOUNTER — Other Ambulatory Visit (INDEPENDENT_AMBULATORY_CARE_PROVIDER_SITE_OTHER)

## 2024-08-30 DIAGNOSIS — E785 Hyperlipidemia, unspecified: Secondary | ICD-10-CM | POA: Diagnosis not present

## 2024-08-30 DIAGNOSIS — E66811 Obesity, class 1: Secondary | ICD-10-CM

## 2024-08-30 LAB — CBC WITH DIFFERENTIAL/PLATELET
Basophils Absolute: 0 K/uL (ref 0.0–0.1)
Basophils Relative: 0.5 % (ref 0.0–3.0)
Eosinophils Absolute: 0.3 K/uL (ref 0.0–0.7)
Eosinophils Relative: 3.4 % (ref 0.0–5.0)
HCT: 39.9 % (ref 36.0–46.0)
Hemoglobin: 13.5 g/dL (ref 12.0–15.0)
Lymphocytes Relative: 22.1 % (ref 12.0–46.0)
Lymphs Abs: 1.6 K/uL (ref 0.7–4.0)
MCHC: 33.9 g/dL (ref 30.0–36.0)
MCV: 87.8 fl (ref 78.0–100.0)
Monocytes Absolute: 0.6 K/uL (ref 0.1–1.0)
Monocytes Relative: 7.6 % (ref 3.0–12.0)
Neutro Abs: 4.9 K/uL (ref 1.4–7.7)
Neutrophils Relative %: 66.4 % (ref 43.0–77.0)
Platelets: 251 K/uL (ref 150.0–400.0)
RBC: 4.54 Mil/uL (ref 3.87–5.11)
RDW: 12.5 % (ref 11.5–15.5)
WBC: 7.4 K/uL (ref 4.0–10.5)

## 2024-08-30 LAB — COMPREHENSIVE METABOLIC PANEL WITH GFR
ALT: 22 U/L (ref 0–35)
AST: 22 U/L (ref 0–37)
Albumin: 4.2 g/dL (ref 3.5–5.2)
Alkaline Phosphatase: 63 U/L (ref 39–117)
BUN: 15 mg/dL (ref 6–23)
CO2: 28 meq/L (ref 19–32)
Calcium: 9.7 mg/dL (ref 8.4–10.5)
Chloride: 103 meq/L (ref 96–112)
Creatinine, Ser: 0.82 mg/dL (ref 0.40–1.20)
GFR: 77.97 mL/min (ref >60.00)
Glucose, Bld: 78 mg/dL (ref 70–99)
Potassium: 4.4 meq/L (ref 3.5–5.1)
Sodium: 138 meq/L (ref 135–145)
Total Bilirubin: 0.4 mg/dL (ref 0.2–1.2)
Total Protein: 7.4 g/dL (ref 6.0–8.3)

## 2024-08-30 LAB — LIPID PANEL
Cholesterol: 207 mg/dL — ABNORMAL HIGH (ref 0–200)
HDL: 46.7 mg/dL (ref >39.00)
LDL Cholesterol: 129 mg/dL — ABNORMAL HIGH (ref 0–99)
NonHDL: 160.39
Total CHOL/HDL Ratio: 4
Triglycerides: 156 mg/dL — ABNORMAL HIGH (ref 0.0–149.0)
VLDL: 31.2 mg/dL (ref 0.0–40.0)

## 2024-09-03 ENCOUNTER — Ambulatory Visit: Payer: Self-pay | Admitting: Family Medicine

## 2024-12-31 ENCOUNTER — Other Ambulatory Visit: Payer: Self-pay | Admitting: Family Medicine
